# Patient Record
Sex: Female | Born: 1955 | Race: White | Hispanic: No | Marital: Married | Smoking: Former smoker
Health system: Southern US, Community
[De-identification: ages and names within clinical notes are randomized; demographics above are authoritative.]

## PROBLEM LIST (undated history)

## (undated) ENCOUNTER — Emergency Department (HOSPITAL_BASED_OUTPATIENT_CLINIC_OR_DEPARTMENT_OTHER): Payer: Medicare Other | Source: Home / Self Care

## (undated) DIAGNOSIS — F32A Depression, unspecified: Secondary | ICD-10-CM

## (undated) DIAGNOSIS — D4959 Neoplasm of unspecified behavior of other genitourinary organ: Secondary | ICD-10-CM

## (undated) DIAGNOSIS — R0989 Other specified symptoms and signs involving the circulatory and respiratory systems: Secondary | ICD-10-CM

## (undated) DIAGNOSIS — N393 Stress incontinence (female) (male): Secondary | ICD-10-CM

## (undated) DIAGNOSIS — F329 Major depressive disorder, single episode, unspecified: Secondary | ICD-10-CM

## (undated) DIAGNOSIS — N816 Rectocele: Secondary | ICD-10-CM

## (undated) DIAGNOSIS — F419 Anxiety disorder, unspecified: Secondary | ICD-10-CM

## (undated) DIAGNOSIS — F99 Mental disorder, not otherwise specified: Secondary | ICD-10-CM

## (undated) DIAGNOSIS — R002 Palpitations: Secondary | ICD-10-CM

## (undated) DIAGNOSIS — R000 Tachycardia, unspecified: Secondary | ICD-10-CM

## (undated) DIAGNOSIS — E119 Type 2 diabetes mellitus without complications: Secondary | ICD-10-CM

## (undated) DIAGNOSIS — D484 Neoplasm of uncertain behavior of peritoneum: Secondary | ICD-10-CM

## (undated) DIAGNOSIS — B009 Herpesviral infection, unspecified: Secondary | ICD-10-CM

## (undated) DIAGNOSIS — F319 Bipolar disorder, unspecified: Secondary | ICD-10-CM

## (undated) DIAGNOSIS — R079 Chest pain, unspecified: Secondary | ICD-10-CM

## (undated) HISTORY — DX: Stress incontinence (female) (male): N39.3

## (undated) HISTORY — DX: Type 2 diabetes mellitus without complications: E11.9

## (undated) HISTORY — PX: LAMINECTOMY: SHX219

## (undated) HISTORY — DX: Chest pain, unspecified: R07.9

## (undated) HISTORY — DX: Rectocele: N81.6

## (undated) HISTORY — DX: Neoplasm of unspecified behavior of other genitourinary organ: D49.59

## (undated) HISTORY — DX: Palpitations: R00.2

## (undated) HISTORY — DX: Other specified symptoms and signs involving the circulatory and respiratory systems: R09.89

## (undated) HISTORY — PX: BACK SURGERY: SHX140

## (undated) HISTORY — DX: Neoplasm of uncertain behavior of peritoneum: D48.4

## (undated) HISTORY — DX: Bipolar disorder, unspecified: F31.9

## (undated) HISTORY — DX: Tachycardia, unspecified: R00.0

## (undated) HISTORY — DX: Herpesviral infection, unspecified: B00.9

---

## 1979-07-13 DIAGNOSIS — B009 Herpesviral infection, unspecified: Secondary | ICD-10-CM

## 1979-07-13 HISTORY — DX: Herpesviral infection, unspecified: B00.9

## 1980-11-11 HISTORY — PX: ABDOMINAL HYSTERECTOMY: SHX81

## 1983-11-12 HISTORY — PX: KNEE SURGERY: SHX244

## 1983-11-12 HISTORY — PX: POSTERIOR REPAIR: SHX2254

## 1996-11-11 HISTORY — PX: CHOLECYSTECTOMY: SHX55

## 1996-11-11 HISTORY — PX: OOPHORECTOMY: SHX86

## 1996-11-11 HISTORY — PX: ANKLE SURGERY: SHX546

## 1998-02-11 ENCOUNTER — Emergency Department (HOSPITAL_COMMUNITY): Admission: EM | Admit: 1998-02-11 | Discharge: 1998-02-11 | Payer: Self-pay | Admitting: Emergency Medicine

## 1998-05-10 ENCOUNTER — Emergency Department (HOSPITAL_COMMUNITY): Admission: EM | Admit: 1998-05-10 | Discharge: 1998-05-10 | Payer: Self-pay | Admitting: Emergency Medicine

## 1998-08-23 ENCOUNTER — Other Ambulatory Visit: Admission: RE | Admit: 1998-08-23 | Discharge: 1998-08-23 | Payer: Self-pay | Admitting: Obstetrics and Gynecology

## 1998-11-22 ENCOUNTER — Emergency Department (HOSPITAL_COMMUNITY): Admission: EM | Admit: 1998-11-22 | Discharge: 1998-11-23 | Payer: Self-pay | Admitting: Emergency Medicine

## 1998-11-24 ENCOUNTER — Ambulatory Visit (HOSPITAL_COMMUNITY): Admission: RE | Admit: 1998-11-24 | Discharge: 1998-11-24 | Payer: Self-pay | Admitting: Gastroenterology

## 1998-12-09 ENCOUNTER — Emergency Department (HOSPITAL_COMMUNITY): Admission: EM | Admit: 1998-12-09 | Discharge: 1998-12-09 | Payer: Self-pay | Admitting: Emergency Medicine

## 1998-12-11 ENCOUNTER — Encounter (HOSPITAL_COMMUNITY): Admission: RE | Admit: 1998-12-11 | Discharge: 1999-03-11 | Payer: Self-pay | Admitting: Psychiatry

## 1999-01-22 ENCOUNTER — Encounter: Admission: RE | Admit: 1999-01-22 | Discharge: 1999-01-22 | Payer: Self-pay | Admitting: Sports Medicine

## 1999-01-23 ENCOUNTER — Encounter: Admission: RE | Admit: 1999-01-23 | Discharge: 1999-01-23 | Payer: Self-pay | Admitting: Sports Medicine

## 1999-02-07 ENCOUNTER — Encounter: Admission: RE | Admit: 1999-02-07 | Discharge: 1999-02-07 | Payer: Self-pay | Admitting: Family Medicine

## 1999-04-06 ENCOUNTER — Encounter: Admission: RE | Admit: 1999-04-06 | Discharge: 1999-04-06 | Payer: Self-pay | Admitting: Family Medicine

## 1999-04-19 ENCOUNTER — Encounter: Admission: RE | Admit: 1999-04-19 | Discharge: 1999-04-19 | Payer: Self-pay | Admitting: Family Medicine

## 1999-07-26 ENCOUNTER — Encounter: Admission: RE | Admit: 1999-07-26 | Discharge: 1999-07-26 | Payer: Self-pay | Admitting: Family Medicine

## 1999-10-02 ENCOUNTER — Encounter: Admission: RE | Admit: 1999-10-02 | Discharge: 1999-10-02 | Payer: Self-pay | Admitting: Sports Medicine

## 1999-10-16 ENCOUNTER — Encounter: Admission: RE | Admit: 1999-10-16 | Discharge: 1999-10-16 | Payer: Self-pay | Admitting: Family Medicine

## 1999-11-30 ENCOUNTER — Ambulatory Visit (HOSPITAL_BASED_OUTPATIENT_CLINIC_OR_DEPARTMENT_OTHER): Admission: RE | Admit: 1999-11-30 | Discharge: 1999-11-30 | Payer: Self-pay

## 1999-11-30 ENCOUNTER — Encounter (INDEPENDENT_AMBULATORY_CARE_PROVIDER_SITE_OTHER): Payer: Self-pay | Admitting: *Deleted

## 1999-12-12 ENCOUNTER — Other Ambulatory Visit: Admission: RE | Admit: 1999-12-12 | Discharge: 1999-12-12 | Payer: Self-pay | Admitting: Obstetrics and Gynecology

## 2000-01-12 ENCOUNTER — Ambulatory Visit (HOSPITAL_COMMUNITY): Admission: RE | Admit: 2000-01-12 | Discharge: 2000-01-12 | Payer: Self-pay | Admitting: *Deleted

## 2001-03-06 ENCOUNTER — Encounter: Admission: RE | Admit: 2001-03-06 | Discharge: 2001-03-06 | Payer: Self-pay | Admitting: Family Medicine

## 2001-03-26 ENCOUNTER — Other Ambulatory Visit: Admission: RE | Admit: 2001-03-26 | Discharge: 2001-03-26 | Payer: Self-pay | Admitting: Obstetrics and Gynecology

## 2001-04-03 ENCOUNTER — Encounter: Admission: RE | Admit: 2001-04-03 | Discharge: 2001-04-03 | Payer: Self-pay | Admitting: Obstetrics and Gynecology

## 2001-04-03 ENCOUNTER — Encounter: Payer: Self-pay | Admitting: Obstetrics and Gynecology

## 2001-04-09 ENCOUNTER — Other Ambulatory Visit: Admission: RE | Admit: 2001-04-09 | Discharge: 2001-04-09 | Payer: Self-pay

## 2001-12-02 ENCOUNTER — Encounter: Admission: RE | Admit: 2001-12-02 | Discharge: 2001-12-02 | Payer: Self-pay | Admitting: Family Medicine

## 2001-12-03 ENCOUNTER — Encounter: Payer: Self-pay | Admitting: Emergency Medicine

## 2001-12-03 ENCOUNTER — Emergency Department (HOSPITAL_COMMUNITY): Admission: EM | Admit: 2001-12-03 | Discharge: 2001-12-03 | Payer: Self-pay | Admitting: Emergency Medicine

## 2001-12-04 ENCOUNTER — Encounter: Admission: RE | Admit: 2001-12-04 | Discharge: 2001-12-04 | Payer: Self-pay | Admitting: Family Medicine

## 2001-12-08 ENCOUNTER — Ambulatory Visit (HOSPITAL_COMMUNITY): Admission: RE | Admit: 2001-12-08 | Discharge: 2001-12-08 | Payer: Self-pay | Admitting: *Deleted

## 2002-03-11 ENCOUNTER — Encounter: Admission: RE | Admit: 2002-03-11 | Discharge: 2002-03-11 | Payer: Self-pay | Admitting: Cardiology

## 2002-03-11 ENCOUNTER — Encounter: Payer: Self-pay | Admitting: Cardiology

## 2002-03-15 ENCOUNTER — Ambulatory Visit (HOSPITAL_COMMUNITY): Admission: RE | Admit: 2002-03-15 | Discharge: 2002-03-15 | Payer: Self-pay | Admitting: Cardiology

## 2002-03-15 HISTORY — PX: CARDIAC CATHETERIZATION: SHX172

## 2002-03-30 ENCOUNTER — Other Ambulatory Visit: Admission: RE | Admit: 2002-03-30 | Discharge: 2002-03-30 | Payer: Self-pay | Admitting: Obstetrics and Gynecology

## 2002-06-22 ENCOUNTER — Inpatient Hospital Stay (HOSPITAL_COMMUNITY): Admission: EM | Admit: 2002-06-22 | Discharge: 2002-06-25 | Payer: Self-pay | Admitting: Psychiatry

## 2002-07-09 ENCOUNTER — Encounter: Admission: RE | Admit: 2002-07-09 | Discharge: 2002-07-09 | Payer: Self-pay | Admitting: Family Medicine

## 2002-10-12 ENCOUNTER — Emergency Department (HOSPITAL_COMMUNITY): Admission: EM | Admit: 2002-10-12 | Discharge: 2002-10-12 | Payer: Self-pay | Admitting: Emergency Medicine

## 2002-10-24 ENCOUNTER — Encounter: Payer: Self-pay | Admitting: Neurosurgery

## 2002-10-24 ENCOUNTER — Ambulatory Visit (HOSPITAL_COMMUNITY): Admission: RE | Admit: 2002-10-24 | Discharge: 2002-10-24 | Payer: Self-pay | Admitting: Neurosurgery

## 2002-12-08 ENCOUNTER — Encounter: Admission: RE | Admit: 2002-12-08 | Discharge: 2002-12-20 | Payer: Self-pay | Admitting: Orthopedic Surgery

## 2002-12-15 ENCOUNTER — Encounter: Admission: RE | Admit: 2002-12-15 | Discharge: 2002-12-15 | Payer: Self-pay | Admitting: Family Medicine

## 2002-12-22 ENCOUNTER — Encounter: Admission: RE | Admit: 2002-12-22 | Discharge: 2002-12-22 | Payer: Self-pay | Admitting: Family Medicine

## 2003-02-14 ENCOUNTER — Encounter: Admission: RE | Admit: 2003-02-14 | Discharge: 2003-02-14 | Payer: Self-pay | Admitting: Family Medicine

## 2003-04-15 ENCOUNTER — Encounter: Admission: RE | Admit: 2003-04-15 | Discharge: 2003-04-15 | Payer: Self-pay | Admitting: Family Medicine

## 2003-04-19 ENCOUNTER — Other Ambulatory Visit: Admission: RE | Admit: 2003-04-19 | Discharge: 2003-04-19 | Payer: Self-pay | Admitting: Obstetrics and Gynecology

## 2003-09-12 ENCOUNTER — Encounter: Admission: RE | Admit: 2003-09-12 | Discharge: 2003-09-12 | Payer: Self-pay | Admitting: Family Medicine

## 2003-11-08 ENCOUNTER — Inpatient Hospital Stay (HOSPITAL_COMMUNITY): Admission: EM | Admit: 2003-11-08 | Discharge: 2003-11-09 | Payer: Self-pay | Admitting: Emergency Medicine

## 2003-11-23 ENCOUNTER — Inpatient Hospital Stay (HOSPITAL_COMMUNITY): Admission: EM | Admit: 2003-11-23 | Discharge: 2003-11-25 | Payer: Self-pay | Admitting: Psychiatry

## 2003-12-27 ENCOUNTER — Encounter: Admission: RE | Admit: 2003-12-27 | Discharge: 2003-12-27 | Payer: Self-pay | Admitting: Family Medicine

## 2004-04-23 ENCOUNTER — Other Ambulatory Visit: Admission: RE | Admit: 2004-04-23 | Discharge: 2004-04-23 | Payer: Self-pay | Admitting: Obstetrics and Gynecology

## 2004-09-03 ENCOUNTER — Emergency Department (HOSPITAL_COMMUNITY): Admission: EM | Admit: 2004-09-03 | Discharge: 2004-09-03 | Payer: Self-pay | Admitting: Emergency Medicine

## 2004-09-14 ENCOUNTER — Ambulatory Visit: Payer: Self-pay | Admitting: Sports Medicine

## 2004-11-21 ENCOUNTER — Ambulatory Visit: Payer: Self-pay | Admitting: Family Medicine

## 2005-02-01 ENCOUNTER — Inpatient Hospital Stay (HOSPITAL_COMMUNITY): Admission: AD | Admit: 2005-02-01 | Discharge: 2005-02-01 | Payer: Self-pay | Admitting: Obstetrics and Gynecology

## 2005-02-26 ENCOUNTER — Ambulatory Visit (HOSPITAL_COMMUNITY): Admission: RE | Admit: 2005-02-26 | Discharge: 2005-02-26 | Payer: Self-pay | Admitting: Gastroenterology

## 2005-02-26 ENCOUNTER — Encounter (INDEPENDENT_AMBULATORY_CARE_PROVIDER_SITE_OTHER): Payer: Self-pay | Admitting: *Deleted

## 2005-03-12 ENCOUNTER — Ambulatory Visit: Payer: Self-pay | Admitting: Family Medicine

## 2005-04-11 ENCOUNTER — Inpatient Hospital Stay (HOSPITAL_COMMUNITY): Admission: RE | Admit: 2005-04-11 | Discharge: 2005-04-11 | Payer: Self-pay | Admitting: Psychiatry

## 2005-04-11 ENCOUNTER — Ambulatory Visit: Payer: Self-pay | Admitting: Psychiatry

## 2005-06-25 ENCOUNTER — Other Ambulatory Visit: Admission: RE | Admit: 2005-06-25 | Discharge: 2005-06-25 | Payer: Self-pay | Admitting: Obstetrics and Gynecology

## 2005-07-22 ENCOUNTER — Ambulatory Visit: Payer: Self-pay | Admitting: Family Medicine

## 2005-09-19 ENCOUNTER — Ambulatory Visit (HOSPITAL_BASED_OUTPATIENT_CLINIC_OR_DEPARTMENT_OTHER): Admission: RE | Admit: 2005-09-19 | Discharge: 2005-09-19 | Payer: Self-pay | Admitting: Orthopedic Surgery

## 2005-09-19 ENCOUNTER — Ambulatory Visit (HOSPITAL_COMMUNITY): Admission: RE | Admit: 2005-09-19 | Discharge: 2005-09-19 | Payer: Self-pay | Admitting: Orthopedic Surgery

## 2005-10-15 ENCOUNTER — Ambulatory Visit: Payer: Self-pay | Admitting: Sports Medicine

## 2005-11-28 ENCOUNTER — Emergency Department (HOSPITAL_COMMUNITY): Admission: EM | Admit: 2005-11-28 | Discharge: 2005-11-29 | Payer: Self-pay | Admitting: Emergency Medicine

## 2005-12-15 ENCOUNTER — Encounter: Admission: RE | Admit: 2005-12-15 | Discharge: 2005-12-15 | Payer: Self-pay | Admitting: Orthopedic Surgery

## 2006-01-10 ENCOUNTER — Encounter: Admission: RE | Admit: 2006-01-10 | Discharge: 2006-01-10 | Payer: Self-pay | Admitting: Orthopedic Surgery

## 2006-01-28 ENCOUNTER — Encounter: Admission: RE | Admit: 2006-01-28 | Discharge: 2006-01-28 | Payer: Self-pay | Admitting: Orthopedic Surgery

## 2006-02-21 ENCOUNTER — Ambulatory Visit: Payer: Self-pay | Admitting: Family Medicine

## 2006-04-01 ENCOUNTER — Ambulatory Visit: Payer: Self-pay | Admitting: Urology

## 2006-05-14 ENCOUNTER — Emergency Department: Payer: Self-pay | Admitting: General Practice

## 2006-05-14 ENCOUNTER — Other Ambulatory Visit: Payer: Self-pay

## 2006-05-15 ENCOUNTER — Other Ambulatory Visit: Payer: Self-pay

## 2006-06-23 ENCOUNTER — Emergency Department (HOSPITAL_COMMUNITY): Admission: EM | Admit: 2006-06-23 | Discharge: 2006-06-23 | Payer: Self-pay | Admitting: Emergency Medicine

## 2006-08-08 ENCOUNTER — Ambulatory Visit: Payer: Self-pay | Admitting: Family Medicine

## 2006-09-03 ENCOUNTER — Other Ambulatory Visit: Admission: RE | Admit: 2006-09-03 | Discharge: 2006-09-03 | Payer: Self-pay | Admitting: Obstetrics and Gynecology

## 2006-11-12 ENCOUNTER — Ambulatory Visit: Payer: Self-pay | Admitting: Family Medicine

## 2006-12-04 ENCOUNTER — Emergency Department: Payer: Self-pay | Admitting: Emergency Medicine

## 2007-01-08 DIAGNOSIS — F319 Bipolar disorder, unspecified: Secondary | ICD-10-CM | POA: Insufficient documentation

## 2007-01-08 DIAGNOSIS — F17200 Nicotine dependence, unspecified, uncomplicated: Secondary | ICD-10-CM | POA: Insufficient documentation

## 2007-01-08 DIAGNOSIS — I951 Orthostatic hypotension: Secondary | ICD-10-CM | POA: Insufficient documentation

## 2007-01-08 DIAGNOSIS — R002 Palpitations: Secondary | ICD-10-CM | POA: Insufficient documentation

## 2007-01-13 ENCOUNTER — Telehealth: Payer: Self-pay | Admitting: *Deleted

## 2007-01-14 ENCOUNTER — Telehealth: Payer: Self-pay | Admitting: *Deleted

## 2007-01-15 ENCOUNTER — Ambulatory Visit: Payer: Self-pay | Admitting: Sports Medicine

## 2007-01-15 DIAGNOSIS — E669 Obesity, unspecified: Secondary | ICD-10-CM | POA: Insufficient documentation

## 2007-01-15 DIAGNOSIS — R1110 Vomiting, unspecified: Secondary | ICD-10-CM | POA: Insufficient documentation

## 2007-01-15 DIAGNOSIS — F502 Bulimia nervosa: Secondary | ICD-10-CM | POA: Insufficient documentation

## 2007-01-22 ENCOUNTER — Ambulatory Visit: Payer: Self-pay | Admitting: Sports Medicine

## 2007-02-09 ENCOUNTER — Emergency Department (HOSPITAL_COMMUNITY): Admission: EM | Admit: 2007-02-09 | Discharge: 2007-02-09 | Payer: Self-pay | Admitting: Emergency Medicine

## 2007-03-04 ENCOUNTER — Encounter: Admission: RE | Admit: 2007-03-04 | Discharge: 2007-03-04 | Payer: Self-pay | Admitting: Gastroenterology

## 2007-04-20 ENCOUNTER — Telehealth: Payer: Self-pay | Admitting: *Deleted

## 2007-04-20 ENCOUNTER — Ambulatory Visit: Payer: Self-pay | Admitting: Sports Medicine

## 2007-04-20 DIAGNOSIS — M5430 Sciatica, unspecified side: Secondary | ICD-10-CM | POA: Insufficient documentation

## 2007-04-30 ENCOUNTER — Encounter: Admission: RE | Admit: 2007-04-30 | Discharge: 2007-04-30 | Payer: Self-pay | Admitting: Orthopedic Surgery

## 2007-06-24 ENCOUNTER — Ambulatory Visit: Payer: Self-pay | Admitting: Family Medicine

## 2007-06-24 ENCOUNTER — Encounter (INDEPENDENT_AMBULATORY_CARE_PROVIDER_SITE_OTHER): Payer: Self-pay | Admitting: Family Medicine

## 2007-06-24 DIAGNOSIS — J029 Acute pharyngitis, unspecified: Secondary | ICD-10-CM | POA: Insufficient documentation

## 2007-06-24 LAB — CONVERTED CEMR LAB: Rapid Strep: NEGATIVE

## 2007-06-27 ENCOUNTER — Telehealth (INDEPENDENT_AMBULATORY_CARE_PROVIDER_SITE_OTHER): Payer: Self-pay | Admitting: Family Medicine

## 2007-06-29 ENCOUNTER — Telehealth: Payer: Self-pay | Admitting: *Deleted

## 2007-09-23 ENCOUNTER — Other Ambulatory Visit: Admission: RE | Admit: 2007-09-23 | Discharge: 2007-09-23 | Payer: Self-pay | Admitting: Obstetrics and Gynecology

## 2007-10-24 ENCOUNTER — Emergency Department (HOSPITAL_COMMUNITY): Admission: EM | Admit: 2007-10-24 | Discharge: 2007-10-24 | Payer: Self-pay | Admitting: *Deleted

## 2008-01-15 ENCOUNTER — Emergency Department (HOSPITAL_COMMUNITY): Admission: EM | Admit: 2008-01-15 | Discharge: 2008-01-16 | Payer: Self-pay | Admitting: Family Medicine

## 2008-01-18 ENCOUNTER — Telehealth (INDEPENDENT_AMBULATORY_CARE_PROVIDER_SITE_OTHER): Payer: Self-pay | Admitting: Family Medicine

## 2008-01-18 ENCOUNTER — Ambulatory Visit: Payer: Self-pay | Admitting: Family Medicine

## 2008-01-29 ENCOUNTER — Ambulatory Visit: Payer: Self-pay | Admitting: Family Medicine

## 2008-01-29 ENCOUNTER — Telehealth: Payer: Self-pay | Admitting: Family Medicine

## 2008-01-29 DIAGNOSIS — M79609 Pain in unspecified limb: Secondary | ICD-10-CM | POA: Insufficient documentation

## 2008-02-03 ENCOUNTER — Encounter (INDEPENDENT_AMBULATORY_CARE_PROVIDER_SITE_OTHER): Payer: Self-pay | Admitting: Family Medicine

## 2008-03-04 ENCOUNTER — Ambulatory Visit: Payer: Self-pay | Admitting: Family Medicine

## 2008-03-09 ENCOUNTER — Encounter (INDEPENDENT_AMBULATORY_CARE_PROVIDER_SITE_OTHER): Payer: Self-pay | Admitting: Family Medicine

## 2008-03-31 ENCOUNTER — Ambulatory Visit (HOSPITAL_COMMUNITY): Admission: RE | Admit: 2008-03-31 | Discharge: 2008-03-31 | Payer: Self-pay | Admitting: *Deleted

## 2008-04-15 ENCOUNTER — Ambulatory Visit: Payer: Self-pay | Admitting: Family Medicine

## 2008-04-15 ENCOUNTER — Telehealth (INDEPENDENT_AMBULATORY_CARE_PROVIDER_SITE_OTHER): Payer: Self-pay | Admitting: *Deleted

## 2008-04-15 DIAGNOSIS — M549 Dorsalgia, unspecified: Secondary | ICD-10-CM | POA: Insufficient documentation

## 2008-04-21 ENCOUNTER — Ambulatory Visit (HOSPITAL_COMMUNITY): Admission: RE | Admit: 2008-04-21 | Discharge: 2008-04-21 | Payer: Self-pay | Admitting: Orthopaedic Surgery

## 2008-04-23 ENCOUNTER — Encounter (INDEPENDENT_AMBULATORY_CARE_PROVIDER_SITE_OTHER): Payer: Self-pay | Admitting: Family Medicine

## 2008-04-23 ENCOUNTER — Emergency Department (HOSPITAL_BASED_OUTPATIENT_CLINIC_OR_DEPARTMENT_OTHER): Admission: EM | Admit: 2008-04-23 | Discharge: 2008-04-24 | Payer: Self-pay | Admitting: Emergency Medicine

## 2008-04-23 ENCOUNTER — Telehealth (INDEPENDENT_AMBULATORY_CARE_PROVIDER_SITE_OTHER): Payer: Self-pay | Admitting: Family Medicine

## 2008-04-26 ENCOUNTER — Emergency Department (HOSPITAL_COMMUNITY): Admission: EM | Admit: 2008-04-26 | Discharge: 2008-04-26 | Payer: Self-pay | Admitting: Emergency Medicine

## 2008-04-26 ENCOUNTER — Telehealth (INDEPENDENT_AMBULATORY_CARE_PROVIDER_SITE_OTHER): Payer: Self-pay | Admitting: Family Medicine

## 2008-04-28 ENCOUNTER — Telehealth (INDEPENDENT_AMBULATORY_CARE_PROVIDER_SITE_OTHER): Payer: Self-pay | Admitting: *Deleted

## 2008-05-18 ENCOUNTER — Inpatient Hospital Stay (HOSPITAL_COMMUNITY): Admission: AD | Admit: 2008-05-18 | Discharge: 2008-05-18 | Payer: Self-pay | Admitting: Gynecology

## 2008-05-20 ENCOUNTER — Encounter: Admission: RE | Admit: 2008-05-20 | Discharge: 2008-05-20 | Payer: Self-pay | Admitting: Obstetrics and Gynecology

## 2008-05-25 ENCOUNTER — Ambulatory Visit (HOSPITAL_COMMUNITY): Admission: RE | Admit: 2008-05-25 | Discharge: 2008-05-26 | Payer: Self-pay | Admitting: Addiction Medicine

## 2008-06-08 ENCOUNTER — Ambulatory Visit: Payer: Self-pay | Admitting: Family Medicine

## 2008-06-08 ENCOUNTER — Telehealth: Payer: Self-pay | Admitting: *Deleted

## 2008-06-09 ENCOUNTER — Telehealth (INDEPENDENT_AMBULATORY_CARE_PROVIDER_SITE_OTHER): Payer: Self-pay | Admitting: Family Medicine

## 2008-06-25 ENCOUNTER — Telehealth (INDEPENDENT_AMBULATORY_CARE_PROVIDER_SITE_OTHER): Payer: Self-pay | Admitting: Family Medicine

## 2008-07-01 ENCOUNTER — Emergency Department (HOSPITAL_COMMUNITY): Admission: EM | Admit: 2008-07-01 | Discharge: 2008-07-01 | Payer: Self-pay | Admitting: Emergency Medicine

## 2008-07-21 ENCOUNTER — Encounter: Admission: RE | Admit: 2008-07-21 | Discharge: 2008-07-21 | Payer: Self-pay | Admitting: Obstetrics and Gynecology

## 2008-11-17 ENCOUNTER — Ambulatory Visit: Payer: Self-pay | Admitting: Obstetrics and Gynecology

## 2009-08-02 ENCOUNTER — Emergency Department (HOSPITAL_COMMUNITY): Admission: EM | Admit: 2009-08-02 | Discharge: 2009-08-02 | Payer: Self-pay | Admitting: Emergency Medicine

## 2010-02-09 ENCOUNTER — Encounter: Admission: RE | Admit: 2010-02-09 | Discharge: 2010-02-09 | Payer: Self-pay | Admitting: Obstetrics and Gynecology

## 2010-03-13 ENCOUNTER — Emergency Department (HOSPITAL_BASED_OUTPATIENT_CLINIC_OR_DEPARTMENT_OTHER): Admission: EM | Admit: 2010-03-13 | Discharge: 2010-03-13 | Payer: Self-pay | Admitting: Emergency Medicine

## 2010-05-15 ENCOUNTER — Ambulatory Visit (HOSPITAL_COMMUNITY): Admission: RE | Admit: 2010-05-15 | Discharge: 2010-05-15 | Payer: Self-pay | Admitting: Psychiatry

## 2010-05-21 ENCOUNTER — Other Ambulatory Visit (HOSPITAL_COMMUNITY): Admission: RE | Admit: 2010-05-21 | Discharge: 2010-05-22 | Payer: Self-pay | Admitting: Psychiatry

## 2010-06-05 ENCOUNTER — Ambulatory Visit: Payer: Self-pay | Admitting: Obstetrics and Gynecology

## 2010-06-05 ENCOUNTER — Other Ambulatory Visit: Admission: RE | Admit: 2010-06-05 | Discharge: 2010-06-05 | Payer: Self-pay | Admitting: Obstetrics and Gynecology

## 2010-06-13 ENCOUNTER — Encounter: Payer: Self-pay | Admitting: Family Medicine

## 2010-07-25 ENCOUNTER — Ambulatory Visit: Payer: Self-pay | Admitting: Obstetrics and Gynecology

## 2010-09-24 ENCOUNTER — Ambulatory Visit: Payer: Self-pay | Admitting: Obstetrics and Gynecology

## 2010-11-09 ENCOUNTER — Emergency Department (HOSPITAL_BASED_OUTPATIENT_CLINIC_OR_DEPARTMENT_OTHER)
Admission: EM | Admit: 2010-11-09 | Discharge: 2010-11-09 | Payer: Self-pay | Source: Home / Self Care | Admitting: Emergency Medicine

## 2010-11-14 ENCOUNTER — Emergency Department (HOSPITAL_BASED_OUTPATIENT_CLINIC_OR_DEPARTMENT_OTHER)
Admission: EM | Admit: 2010-11-14 | Discharge: 2010-11-14 | Payer: Self-pay | Source: Home / Self Care | Admitting: Emergency Medicine

## 2010-12-02 ENCOUNTER — Encounter: Payer: Self-pay | Admitting: Obstetrics and Gynecology

## 2010-12-02 ENCOUNTER — Encounter: Payer: Self-pay | Admitting: Orthopedic Surgery

## 2010-12-02 ENCOUNTER — Encounter: Payer: Self-pay | Admitting: *Deleted

## 2010-12-11 NOTE — Consult Note (Signed)
Summary: [facility]   Imported By: De Nurse 06/20/2010 14:02:32  _____________________________________________________________________  External Attachment:    Type:   Image     Comment:   External Document

## 2011-02-15 LAB — CBC
HCT: 39.7 % (ref 36.0–46.0)
Hemoglobin: 13.7 g/dL (ref 12.0–15.0)
Platelets: 335 10*3/uL (ref 150–400)
RBC: 3.99 MIL/uL (ref 3.87–5.11)
WBC: 10.9 10*3/uL — ABNORMAL HIGH (ref 4.0–10.5)

## 2011-02-15 LAB — DIFFERENTIAL
Basophils Absolute: 0.1 10*3/uL (ref 0.0–0.1)
Basophils Relative: 1 % (ref 0–1)
Monocytes Absolute: 0.5 10*3/uL (ref 0.1–1.0)
Neutro Abs: 5.9 10*3/uL (ref 1.7–7.7)

## 2011-02-15 LAB — COMPREHENSIVE METABOLIC PANEL
Albumin: 3.7 g/dL (ref 3.5–5.2)
Alkaline Phosphatase: 79 U/L (ref 39–117)
BUN: 8 mg/dL (ref 6–23)
CO2: 27 mEq/L (ref 19–32)
Chloride: 106 mEq/L (ref 96–112)
GFR calc non Af Amer: >60 mL/min (ref >60)
Glucose, Bld: 129 mg/dL — ABNORMAL HIGH (ref 70–99)
Potassium: 3.2 mEq/L — ABNORMAL LOW (ref 3.5–5.1)
Total Bilirubin: 0.3 mg/dL (ref 0.3–1.2)

## 2011-02-15 LAB — ETHANOL: Alcohol, Ethyl (B): <5 mg/dL (ref 0–10)

## 2011-03-26 NOTE — Discharge Summary (Signed)
NAMEGIUSEPPINA, [name]             ACCOUNT NO.:  [id number]   MEDICAL RECORD NO.:  [id number]          PATIENT TYPE:  OIB   LOCATION:  9310                          FACILITY:  [facility]   PHYSICIAN:  Daniel L. Gottsegen, M.D.DATE OF BIRTH:  03/23/1956   DATE OF ADMISSION:  05/25/2008  DATE OF DISCHARGE:  05/26/2008                               DISCHARGE SUMMARY   The patient is a 55 year old female who was taken to the operating room  on May 25, 2008, for a chronic right lower quadrant pain.  An open  laparoscopy was performed.  Findings were adhesions of bowel to the  anterior peritoneal wall which were taken down with a harmonic scalpel  and a sharp scissor without difficulty.  Because of the lateness  the  procedure was done and the extensiveness of the lysis of adhesions and  concern about bowel injury, it was felt wiser to keep her for  observation overnight.  On the morning of 16th, she was voiding well,  passing gas, had a soft abdomen and was tolerating p.o. foods, so she  was discharge.   DISCHARGE MEDICATION:  Either Advil or Aleve for pain relief and she was  also given a prescription for Percocet 5/325.   FOLLOWUP:  She will return to the office in 1 week.   WOUND CARE:  Routine.   ACTIVITY:  Regular.  Only restrictions is no lifting.   LABORATORY DATA:  Preoperative hemoglobin is 15.3, hematocrit 45.8,  white count was 13,900.  Urinalysis was negative.   CONDITION ON DISCHARGE:  Improved.   FINAL DIAGNOSIS:  Chronic right lower quadrant pain with adhesions of  bowel to the anterior peritoneal wall.   OPERATIONS:  Laparoscopy with lysis of bowel adhesions, freeing up the  bowel from the anterior abdominal wall.      Daniel L. Eda Paschal, M.D.  Electronically Signed     DLG/MEDQ  D:  05/26/2008  T:  05/27/2008  Job:  [id number]

## 2011-03-26 NOTE — H&P (Signed)
[name], [name]             ACCOUNT NO.:  [id number]   MEDICAL RECORD NO.:  [id number]          PATIENT TYPE:  AMB   LOCATION:  SDC                           [facility]   PHYSICIAN:  Daniel L. Gottsegen, M.D.DATE OF BIRTH:  November 11, 1956   DATE OF ADMISSION:  DATE OF DISCHARGE:                              HISTORY & PHYSICAL   CHIEF COMPLAINT:  Right lower quadrant pain.   HISTORY OF PRESENT ILLNESS:  The patient is a 55 year old gravida 56,  para 4-1-4-4 who had presented to the office approximately 1 month ago  with right lower quadrant pain.  The patient did not have symptoms  referable to either her GI tract or GU tract.  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  On ultrasound, she had a small  ovarian cyst.  She initially was watched expectantly, but the pain has  persisted and actually gotten worse.  The patient has been to the  emergency room on several occasions.  Because of the above, she has now  undergone a CT scan of her abdomen and pelvis which did not reveal any  etiology for the above.  She has a normal appendix on CT scan.  Because  of the persistence, she now enters the hospital for laparoscopy with  hopes that it will help [place] assess why she is having this constant pain.  She appreciates the fact that because of her previous surgery, she might  have dense pelvic adhesive disease making impossible to see her ovary  and tube laparoscopically.  We had extreme difficulty when we did her  salpingo-oophorectomy on the left, but at that time, which was her last  surgery, she did have no adhesions in the right lower quadrant.  We have  elected to remove her right ovary and tube because her pain appeared to  be localized there with a small cyst, which has been there on repetitive  ultrasounds.  However, the patient appreciating recuperation from a  laparotomy, has elected not to  have a laparotomy if we cannot find the  source of the pain or treat it laparoscopically at this point, but to  continue to watch the pain and so not to have an extended recuperation.  I think that is reasonable based on the fact that there is no evidence  that this is a malignancy.   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   PRESENT MEDICATIONS:  Digoxin per her cardiologist and Xanax p.r.n.  anxiety.   ALLERGIES:  She is allergic only to CODEINE, but she does fine with  Percocet.   FAMILY HISTORY:  Reveals the paternal grandmother and father were  diabetic.  Paternal grandfather had coronary artery disease.   SOCIAL HISTORY:  She smokes approximately half a pack of cigarettes a  day.  She rarely uses alcohol  REVIEW OF SYSTEMS:  HEENT:  Negative.  CARDIOVASCULAR:  See above.  RESPIRATORY:  Negative.  GI:  Irritable bowel syndrome.  Please note  this pain is different than what she gets with irritable bowel.  MUSCULOSKELETAL:  Negative.  GU:  Negative.  NEUROLOGIC/PSYCHIATRIC:  Reveals a history of depression and previous psychiatric care.  She is  currently on no psychiatric drugs. ALLERGIC, IMMUNOLOGIC, LYMPHATIC, AND  ENDOCRINE:  Negative.   PHYSICAL EXAMINATION:  GENERAL:  The patient is a well-developed and  well-nourished female in no acute distress.  VITAL SIGNS:  Her blood pressure is 124/74.  Her pulses 80 and regular.  Her respirations are 16 and nonlabored.  HEENT:  Within normal limits.  NECK:  Supple.  Trachea in the midline.  The thyroid is not enlarged.  LUNGS:  Clear to P&A.  HEART:  No thrills, heaves, or murmurs.  BREASTS:  No masses.  ABDOMEN:  Soft without guarding, rebound, or masses.  PELVIC:  External and  BUS is normal.  Vaginal is normal.  Pap shows no  atypia.  Uterus is surgically absent.  Her right adnexa is not enlarged  on bimanual, but is extremely tender.  Left adnexa is surgically absent.  Rectovaginal is confirmatory.   ADMISSION/IMPRESSION:  Right lower quadrant pain of unknown etiology.   PLAN:  Diagnostic laparoscopy for diagnosis of the above with right  salpingo-oophorectomy; if it can be done laparoscopically, otherwise, it  will not be done.      Daniel L. Eda Paschal, M.D.  Electronically Signed     DLG/MEDQ  D:  05/25/2008  T:  05/25/2008  Job:  [id number]

## 2011-03-26 NOTE — Op Note (Signed)
[name], [name]             ACCOUNT NO.:  [id number]   MEDICAL RECORD NO.:  [id number]          PATIENT TYPE:  OIB   LOCATION:  9310                          [facility]   PHYSICIAN:  Daniel L. Gottsegen, M.D.DATE OF BIRTH:  05-Oct-1956   DATE OF PROCEDURE:  05/25/2008  DATE OF DISCHARGE:                               OPERATIVE REPORT   PREOPERATIVE DIAGNOSIS:  Right lower quadrant pain.   POSTOPERATIVE DIAGNOSES:  1. Right lower quadrant pain.  2. Dense adhesions of small bowel and sigmoid to anterior peritoneal      wall.   OPERATIONS:  Diagnostic laparoscopy with lysis of adhesions.   SURGEON:  Daniel L. Eda Paschal, MD   FIRST ASSISTANTGaetano Hawthorne. Lily Peer, MD   FINDINGS:  At the time of laparoscopy, the most obvious abnormality was  that the patient had adhered some small bowel and the sigmoid colon to  the anterior peritoneal wall.  There were sort of windows on both sides.  We could see into the pelvis, but it was adherent in the top and when  you pushed on her abdomen, it appeared to be adherent right at the right  lower quadrant where the patient was having her pain.  Once all these  adhesions had been freed up, although she had other small and large  bowel adhesions, none of them involved putting any bowel on tension and  appeared to be consistent with what we really saw after a laparotomy.  The left pelvic area was free of any disease.  You could not see where  the right ovary and tube were, and it was thought that they probably  were buried under small bowel that was adherent into the lower part of  the cul-de-sac.  The ileocecal junction was identified.  The appendix  appeared normal.  The right upper quadrant was visualized.  Her liver  appeared normal.  Her gallbladder had been previously removed.  The  laparoscope was also placed up in the left upper quadrant and nothing  was seen there either.   PROCEDURE:  After adequate general endotracheal  anesthesia, the patient  was placed in the dorsal lithotomy position, prepped and draped in the  usual sterile manner.  A sponge stick was placed in the vagina to see  where the vaginal cuff was, so a Foley catheter was inserted into the  patient's bladder.  It was decided that an open laparoscopy was safer  because of her previous midline vertical incision than a closed  laparoscopy.  A small vertical incision was made below the umbilicus.  It was carried through the fascia, which was tacked with 0 Vicryl.  The  peritoneum was entered with sharp dissection without difficulty.  A  Hasson catheter was put in place.  A pneumoperitoneum was created.  Throughout the procedure either the 10-mm laparoscope that was a  diagnostic or the operating 10-mm laparoscope was used.  At one point, a  5-mm laparoscope was placed in the right lower quadrant incision to look  for bowel behind the adhesions.  Two other ports were placed, one in the  right  lower quadrant and one in the left lower quadrant, these were 5 mm  ports. Through this, a variety of instruments were placed as the  procedure was done it was done carefully and slowly to prevent injury to  the bowel.  Instrumentation used to free up the bowel included just a  sharp scissor without any energy source or the Harmonic scalpel.  There  were times when it was easier to lyse the adhesions through the  operating channel of the scope and there were times when you can use the  Harmonic scalpel through one of the lower incisions.  The Harmonic  scalpel was actually used both in the right and the left lower  incisions.  All the bowel was freed up.  At no point was any bleeding  encountered.  The pelvis was carefully inspected when we were done and  there were absolutely no bleeding and no injury to the bowel.  We could  see no other reason for her pain, although we were pretty comfortable  that we had found the source of it, which is where the bowel  had been  adherent to the anterior wall and was put on tension.  We could not see  the right adnexa. Our discussion with the patient preoperatively had  been that if we could see it we would remove it.  On ultrasound and on  CT scan, it did not show any pathology and therefore it was certainly  not mandatory to do so.  She did not want to have a laparotomy just to  remove it and I do not think it would have helped her pain either, so at  this point, the procedure was terminated.  The trocars were removed.  The pneumoperitoneum was evacuated.  The 0 Vicryl suture had been placed  in the fascia subumbilically was tied in place, which completely closed  the fascia.  The subcutaneous tissue was brought together with 0 Vicryl  and then all incisions were closed with Steri-Strips.  Blood loss during  the procedure was minimal.  The patient continued to make clear urine  throughout the procedure.  The Foley catheter was removed at the  termination of procedure.      Daniel L. Eda Paschal, M.D.  Electronically Signed     DLG/MEDQ  D:  05/25/2008  T:  05/26/2008  Job:  [id number]

## 2011-03-29 NOTE — H&P (Signed)
NAME:  [name], [name]                       ACCOUNT NO.:  [id number]   MEDICAL RECORD NO.:  [id number]                   PATIENT TYPE:  IPS   LOCATION:  0506                                 [facility]ILITY[facility]   PHYSICIAN:  Geoffery Lyons, M.D.                   DATE OF BIRTH:  [birth year]   DATE OF ADMISSION:  11/23/2003  DATE OF DISCHARGE:                                HISTORY & PHYSICAL   IDENTIFYING INFORMATION:  A 55 year old married white female, voluntarily  admitted on November 23, 2003.   HISTORY OF PRESENT ILLNESS:  The patient presents with a history of  depression and suicidal thoughts for one day with no specific plan.  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  She  denies any psychotic symptoms.  She states she is no longer suicidal.  She  has been compliant with the medications.  Her sleeping and appetite has been  satisfactory.  The patient is anxious to go home.  The patient states she  has a weekend with her husband for the first time.  Her sister-in-law is  going to be caring for her mother-in-law.   PAST PSYCHIATRIC HISTORY:  Second hospitalization at [facility], was here in August 2004 for depression.  She has a therapist, Gretchen Short and Dr. Rosalia Hammers who is her psychiatrist.  History of bipolar disorder,  history of a suicide attempt years ago where she overdosed.   SOCIAL HISTORY:  She is a 55 year old married white female, married for 17  years, has 4 children ages 50, 5, 86 and 52.  She lives with her husband  and is carrying for her mother-in-law for the past 14 months, who has  Alzheimer's.  No legal problems, completed 1 year of college.  She is on  disability due to her back and her bipolar disorder.   FAMILY HISTORY:  She believes her mother has some psychiatric problem but  she is unsure what it is.   ALCOHOL DRUG HISTORY:  The patient  smokes, she drinks 1-2 drinks per year,  denies any drug use.   PAST MEDICAL HISTORY:  Primary care Carmita Boom is Dr. Georgina Peer.  Medical problems are chronic back pain, chronic urinary tract infection,  history of arrythmia.   MEDICATIONS:  Digoxin 0.25 mg, Elavil 100 mg q.h.s., Valium 10 mg in  morning, 20 at bedtime, has been on that for years.  Paxil CR 25 mg b.i.d.,  Depakote 500 in the morning and 1000 mg at bedtime, Haldol 5 mg t.i.d., was  put on that for paranoid ideation.  Hydrocodone 5/500 prescribed by Dr.  Sabino Donovan.   DRUG ALLERGIES:  No known allergies.   PHYSICAL EXAMINATION:  Performed, no significant findings, nonfocal  neurological findings.  Her vital signs are within normal limits.  The  patient is overweight with a weight of 212 pounds.   LABORATORY DATA:  Platelet count is 423, glucose is 116.   MENTAL STATUS EXAM:  She is an alert, middle-aged female, casually dressed,  cooperative.  Speech is clear, mood is some mild depression, mild anxiety.  Thought processes are coherent.  There is no evidence of psychosis, no  auditory or visual hallucinations, cognitive function intact.  Judgment and  insight is fair.   ADMISSION DIAGNOSES:   AXIS I:  Bipolar disorder.   AXIS II:  Deferred.   AXIS III:  Chronic back pain, chronic urinary tract infection and history of  arrythmia.   AXIS IV:  Other psychosocial problems related to caregiver stress, medical  problems.   AXIS V:  Current is 40, estimated this past year 65-70.   PLAN:  Voluntary admission for increased depression, unable to contract for  safety.  Stabilize mood and thinking.  We will resume her medications,  consider family session,  Information on Alzheimer's support groups would be  helpful for this patient.  The patient to follow up with Dr. Betti Cruz, continue  to be medication compliant.   TENTATIVE LENGTH OF CARE:  3-4 days.     Landry Corporal, N.P.                        Geoffery Lyons, M.D.    JO/MEDQ  D:  11/25/2003  T:  11/25/2003  Job:  [id number]

## 2011-03-29 NOTE — Cardiovascular Report (Signed)
[facility]  Patient:    [name], [name] Visit Number: [id number] MRN: [id number]          Service Type: CAT Location: [facility] 2899 11 Attending Physician:  Loreli Dollar Dictated by:   Julieanne Manson, M.D. Proc. Date: 03/15/02 Admit Date:  03/15/2002 Discharge Date: 03/15/2002   CC:         Royal Hawthorn B. Darrick Penna, M.D.   Cardiac Catheterization  INDICATIONS FOR TEST:  Ms. Mcclusky is a 55 year old female who has had palpitations and chest pain.  A nuclear study was performed that showed an ejection fraction of 46% and questionable anterior ischemia.  Because of this, she is brought in for outpatient cardiac catheterization.  DESCRIPTION OF PROCEDURE:  The patient was prepped and draped in the usual sterile fashion exposing the right groin.  Applying local anesthetic of 1% Xylocaine, the Seldinger technique was employed and a #5 [place] introducer sheath was placed into the right femoral artery.  Selective left and right coronary arteriography and ventriculography in the RAO projection was performed.  RESULTS:  HEMODYNAMIC MONITORING: 1. Central aortic pressure 106/62. 2. Left ventricular pressure 107/16. 3. No aortic valve gradient noted at the time of pullback.  VENTRICULOGRAPHY:  Ventriculography in the RAO projection revealed normal left ventricular systolic function.  The ejection fraction was 65%.  The end diastolic pressure was 16.  Questionable MVP was noted.  No MR.  CORONARY ARTERIOGRAPHY:  There was no calcification noted on fluoroscopy. 1. Left main:  Normal.  2. LAD:  The LAD extended down towards the apex of the heart and the distal    portion of the LAD was relatively small.  There were 2 diagonal branches    that were free of disease as was the lad.  3. Circumflex:  The circumflex gave rise to 2 OM vessels free of disease.  4. Right coronary artery:  The right coronary artery was a very large dominant    vessel.  The PDA  extended down and touched the apex of the heart.  There    were 5 posterolateral branches, all of which were free of disease.  CONCLUSIONS: 1. No evidence of coronary artery disease. 2. Normal left ventricular systolic function. 3. Questionable mitral valve prolapse.  DISCUSSION:  It appears that the nuclear study was false-positive.  This lady clearly does not have coronary disease.  Will manage her arrhythmias with low-dose beta blockers.  She will need risk factor management. Dictated by:   Julieanne Manson, M.D. Attending Physician:  Loreli Dollar DD:  03/15/02 TD:  03/15/02 Job: [id number]

## 2011-03-29 NOTE — Discharge Summary (Signed)
NAME:  [name], [name]                       ACCOUNT NO.:  [id number]   MEDICAL RECORD NO.:  [id number]                  PATIENT TYPE:  IPS   LOCATION:  0508                                 [facility]ILITY[facility]   PHYSICIAN:  Emma Lyons, MD                     DATE OF BIRTH:  February 16, 1956   DATE OF ADMISSION:  06/22/2002  DATE OF DISCHARGE:  06/25/2002                                 DISCHARGE SUMMARY   CHIEF COMPLAINT AND PRESENT ILLNESS:  This was the first admission to [facility], some previous admissions to [facility]  of [place], for this 55 year old married white female voluntarily  admitted for depression and suicidal ideas.  She had a history of depression  with suicidal ideas with a plan to overdose.  She had increased depression  since May, stress issues with mother, loss of interest, withdrawal,  decreased sleep, appetite satisfactory, no psychosis.  She had heard voices  in the past.  She was compliant with medications but had not been able to  improve in spite of close followup by her primary psychiatrist.   PAST PSYCHIATRIC HISTORY:  This was her first time at [facility]; multiple admissions at [facility] [place].  She saw Dr.  Betti Cruz; last time the day before this admission.   SUBSTANCE ABUSE HISTORY:  Denied the use or abuse of any substances.   PAST MEDICAL HISTORY:  1. Diskogenic disk disease.  2. History of arrhythmia.  3. Irritable bowel syndrome.   MEDICATIONS:  1. Effexor XR 75 mg three times a day.  2. Depakote 500 mg in the morning and Depakote 1000 mg at bedtime.  3. Xanax 2 mg in the morning and 4 mg at bedtime.  4. Cogentin 2 mg at bedtime.  5. Elavil 100 mg at bedtime.  6. Thorazine 100 mg as needed.  7. Hydrocodone 10/650 mg one every 12 hours as needed for back pain.   PHYSICAL EXAMINATION:  Physical examination was performed, failed to show  any acute findings.   MENTAL STATUS EXAM:   Mental status exam revealed an alert, middle-aged  female, cooperative.  Speech: Clear, goal directed.  Mood: Frustrated,  depressed.  Affect: Irritable, flat.  Thought processes: Coherent; no  evidence of psychosis, no auditory and visual hallucinations, no active  suicidal or homicidal ideas. Cognitive: Cognition was well preserved.   ADMISSION DIAGNOSES:   AXIS I:  Bipolar disorder, depressed.   AXIS II:  No diagnosis.   AXIS III:  1. Arrhythmia.  2. Irritable bowel syndrome.  3. Diskogenic disk disease.   AXIS IV:  Moderate.   AXIS V:  Global assessment of functioning upon admission 30, highest global  assessment of functioning in the last year 55.   HOSPITAL COURSE:  She was admitted and started in intensive individual and  group psychotherapy.  Her Effexor was increased to 75  mg three times a day.  She looked at the events that led her to be admitted, how the depression  built up.  She said that the fact that she was removed from her environment  and came here allowed her to start feeling better.  In individual and group,  she was able to do some grief/loss work.  On August 16, she felt she was  much better, denied any suicidal or homicidal ideas, was more hopeful, felt  positive response to the medication and was willing and motivated to go back  to Dr. Betti Cruz and pursue further outpatient followup.   DISCHARGE DIAGNOSES:   AXIS I:  Bipolar disorder, depressed.   AXIS II:  No diagnosis.   AXIS III:  1. Diskogenic disk disease.  2. Irritable bowel syndrome.  3. Arrhythmia.   AXIS IV:  Moderate.   AXIS V:  Global assessment of functioning upon discharge 60.   DISCHARGE MEDICATIONS:  1. Effexor XR 75 mg three times a day.  2. Depakote ER 500 mg in the morning and 1000 at bedtime.  3. Xanax 2 mg in the morning and 4 mg at bedtime.  4. Cogentin 2 mg at bedtime.  5. Digoxin 0.25 mg one daily.  6. Elavil 100 mg at bedtime.  7. Metamucil two three times a day.  8.  Colace 100 mg three times a day.  9. Thorazine 100 mg as needed for anxiety and agitation.   FOLLOW UP:  She was to follow-up with Dr. Betti Cruz and [facility].                                                 Emma Lyons, MD   IL/MEDQ  D:  07/26/2002  T:  07/27/2002  Job:  [id number]

## 2011-03-29 NOTE — Op Note (Signed)
NAMEALIVEA, GLADSON             ACCOUNT NO.:  [id number]   MEDICAL RECORD NO.:  [id number]          PATIENT TYPE:  AMB   LOCATION:  DSC                          FACILITY:  [facility]   PHYSICIAN:  Rodney A. Mortenson, M.D.DATE OF BIRTH:  05-22-1956   DATE OF PROCEDURE:  09/19/2005  DATE OF DISCHARGE:                                 OPERATIVE REPORT   PREOPERATIVE DIAGNOSIS:  Chronic medial epicondylitis, right elbow.   POSTOPERATIVE DIAGNOSIS:  Chronic medial epicondylitis, right elbow.   OPERATION:  Release of flexor medial epicondyle and debridement of  degenerative mucinous flexor wad, small medial epicondylectomy.   SURGEON:  Lenard Galloway. Chaney Malling, M.D.   ANESTHESIA:  General.   PROCEDURE:  The patient was placed on the operating table in supine position  with a pneumatic tourniquet about the right upper arm.  The right upper  extremity was prepped with DuraPrep and draped out in the usual manner.  The  arm was then wrapped with an Esmarch and the tourniquet was elevated.  A  small incision was made over the medial epicondyle and extended distally for  about 1.5 to 2 cm.  The skin edges were retracted.  Blunt dissection was  carried down to the flexor wad.  There was mucinous degeneration in the  tendinous tissues.  Previous injections could be seen.  This area of  degenerative mucinous material was then excised elliptically with a sharp  knife.  The flexor rod was released off the medial epicondyle and a rongeur  was used to clean up the medial epicondyle.  Complete decompression of the  medial epicondyle was achieved and all degenerative material was removed.  The subcutaneous tissue was then closed with 2-0 Vicryl and stainless steel  staples were used to close the skin.  An injection of Marcaine was done and  a sterile dressing was applied.  The patient was returned to the recovery  room in excellent condition.  Technically, the procedure went extremely  well.   FOLLOW  UP:  1.  To my office next week.  2.  Ultram for pain.           ______________________________  Lenard Galloway. Chaney Malling, M.D.     RAM/MEDQ  D:  09/19/2005  T:  09/19/2005  Job:  [id number]

## 2011-03-29 NOTE — Op Note (Signed)
NAMEFAIZA, [name]             ACCOUNT NO.:  [id number]   MEDICAL RECORD NO.:  [id number]          PATIENT TYPE:  AMB   LOCATION:  ENDO                         FACILITY:  [facility]   PHYSICIAN:  Petra Kuba, M.D.    DATE OF BIRTH:  February 15, 1956   DATE OF PROCEDURE:  02/26/2005  DATE OF DISCHARGE:                                 OPERATIVE REPORT   PROCEDURE:  Colonoscopy with biopsy.   ENDOSCOPIST:  Petra Kuba, M.D.   INDICATIONS FOR PROCEDURE:  Abdominal pain, history of diverticula, also  almost due for colonic screening in two months.  The consent was signed  after the risks, benefits, methods and options were thoroughly discussed in  the office.   MEDICINES USED:  Demerol 170 mg, Versed 12 mg.   DESCRIPTION OF PROCEDURE:  A rectal inspection is pertinent for external  hemorrhoids, small.  A digital examination was negative.  The video  pediatric adjustable colonoscope was inserted, and once through a tortuous  spastic sigmoid, was easily able to be advanced around the colon to the  cecum.  This did not require any position changes, but we did use some  abdominal pressure.  No obvious abnormality was seen on insertion.  The  cecum was identified by the appendiceal orifice and the ileal cecal valve.  In fact, the scope was inserted a short ways into the terminal ileum which  was normal.  Photo documentation was obtained.  The scope was slowly  withdrawn.  The prep was fairly adequate.  With lots of washing and  suctioning, adequate visualization was obtained.  The cecum, ascending and  transverse were normal.  As the scope was withdrawn around the left side,  there was some tortuosity, spasm and wall edema.  A few biopsies were  obtained.  I do not think that the edematous folds were due to polyps, but a  few biopsies were obtained, primarily in the sigmoid.  There was some mucus  on the left side of the colon, but no other abnormalities and no diverticula  were seen.  An  anal/rectal pull-through on retroflexion confirmed some small  hemorrhoids.  The scope was drained and re-advanced a short ways up the left  side of the colon.  Air was suctioned and the scope removed.   The patient tolerated the procedure well.  There was no obvious immediate  complication.   ENDOSCOPIC ASSESSMENT:  1.  Internal and external small hemorrhoids.  2.  Tortuous sigmoid, minimal edema and erosions, status post biopsy.  3.  Otherwise normal to the terminal ileum   PLAN:  I will await pathology.  Repeat screening in five years.  Follow up  p.r.n. or in three months, just to check symptoms and make sure no further  workup plans are needed.    MEM/MEDQ  D:  02/26/2005  T:  02/26/2005  Job:  [id number]

## 2011-03-29 NOTE — Discharge Summary (Signed)
NAME:  [name], [name]                       ACCOUNT NO.:  [id number]   MEDICAL RECORD NO.:  [id number]                   PATIENT TYPE:  INP   LOCATION:  3727                                 FACILITY:  [facility]   PHYSICIAN:  Thereasa Solo. Little, M.D.              DATE OF BIRTH:  [birth year]   DATE OF ADMISSION:  11/08/2003  DATE OF DISCHARGE:  11/09/2003                                 DISCHARGE SUMMARY   DISCHARGE DIAGNOSES:  1. Chest pain, myocardial infarction ruled out.  2. Anxiety.  3. History of smoking.   HOSPITAL COURSE:  The patient is a 55 year old female seen in the past by  Dr. Clarene Duke with a history of palpitations and chest pain. She had an  abnormal Cardiolite study and a subsequent normal catheterization in April  2003. She was admitted through the emergency room with chest pain on  November 08, 2003.  She has been on Elavil and Valium at home. Previously,  she had been on Paxil.  She was admitted to telemetry. CK-MB and troponins  were obtained and these were negative for an MI. She was seen in consult by  the family practice service on November 09, 2003. It was decided to resume  her Paxil CR at 25 mg a day and continue Elavil 50 mg at h.s. as well as  Valium h.s. She has been given instructions to contact  Dr. Betti Cruz, her psychiatrist. She does admit to exacerbation of stress; she  apparently cares for her mother-in-law. We had considered sending her home  on a PPI, but it was felt that she had not had symptoms consistent with  reflux for some years since her cholecystectomy. She is discharged on the  afternoon of the 29th.   DISCHARGE MEDICATIONS:  1. Digitek 0.25 mg a day.  2. Paxil CR 25 mg a day.  3. Elavil 50 mg h.s.  4. Valium 20 mg h.s.   LABORATORY:  White count 11.2, hemoglobin 14, hematocrit 40.4, platelet  count 346,000. CK-MB and troponin negative. Sodium 140, potassium 3.7, BUN  8, creatinine 0.6. Magnesium 1.7.  Urinalysis unremarkable.   DISPOSITION:  The patient is discharged in stable condition and will follow  up with Dr. Clarene Duke on December 06, 2003, at 2:15 as well as Dr. Betti Cruz.      Abelino Derrick, P.A.                      Thereasa Solo. Little, M.D.    Lenard Lance  D:  11/09/2003  T:  11/09/2003  Job:  [id number]   cc:   Thereasa Solo. Little, M.D.  [address]  [address]  [place]  [place] [address]  Fax: [phone]   [facility]   Daine Floras, M.D.  [address]  [place]  [place] [address]  Fax: [phone]

## 2011-03-29 NOTE — Discharge Summary (Signed)
[name], [name] NO.:  [id number]   MEDICAL RECORD NO.:  [id number]          PATIENT TYPE:  IPS   LOCATION:  0300                          [facility]ACILITY:  [facility]   PHYSICIAN:  [name], M.D.      DATE OF BIRTH:  [birth year]   DATE OF ADMISSION:  04/11/2005  DATE OF DISCHARGE:  04/11/2005                                 DISCHARGE SUMMARY   CHIEF COMPLAINT AND PRESENT ILLNESS:  This was one of multiple admissions to  [facility] for this 55 year old married white female  voluntarily admitted.  History of spiraling down, depressed, suicidal  thoughts, would not divulge a plan.  Multiple deaths in the family including  her sister was killed in October.  The person who killed her received a very  small sentence and probation.  Upset that she was admitted.  She said she  was no longer suicidal.  She wanted to be discharged as she usually does not  benefit from being in the hospital.   PAST PSYCHIATRIC HISTORY:  Multiple hospitalizations.  Followed by Dr.  Betti [name].   ALCOHOL/DRUG HISTORY:  Denies the use or abuse of any substances.   MEDICAL HISTORY:  Noncontributory.   MEDICATIONS:  Cymbalta 60 mg per day.   PHYSICAL EXAMINATION:  Performed and failed to show any acute findings.   LABORATORY DATA:  Cancelled as the patient wanted to leave.   MENTAL STATUS EXAM:  Fully alert female.  Anxious for being in the unit.  Speech clear, normal rate, tempo and production.  Mood angry for being in  the unit.  Affect angry.  Thought processes logical, coherent and relevant.  Wanting to be discharged.  No delusions.  No suicidal or homicidal ideation.  No hallucinations.  Cognition was well-preserved.   ADMISSION DIAGNOSES:   AXIS I:  Bipolar disorder.   AXIS II:  No diagnosis.   AXIS III:  No diagnosis.   AXIS IV:  Moderate.   AXIS V:  Global Assessment of Functioning upon admission 35; highest Global  Assessment of Functioning in the last year  65.   HOSPITAL COURSE:  She settled in.  She felt that just by talking somewhat  about her problems helped her.  She was able to talk about the feelings, the  losses.  The family was contacted.  They had no concern about her safety.  She was in full contact with reality.  Again, she endorsed she was upset  because of the losses.  She was needing someone to talk to.  After she  talked to assessment person, she felt she was ready to go home but there was  a misunderstanding and she was admitted.  Again, she denied any suicidal or  homicidal ideation.  She was very involved with her [name], Dr. Betti [name], so  we went ahead and discharged to outpatient follow-up.   DISCHARGE DIAGNOSES:   AXIS I:  Bipolar disorder, depressed.   AXIS II:  No diagnosis.   AXIS III:  No diagnosis.   AXIS IV:  Moderate.   AXIS V:  Global Assessment of Functioning upon  discharge 50.   DISCHARGE MEDICATIONS:  Cymbalta 60 mg.   FOLLOW UP:  Continue to see Dr. Betti [name] and [name] for psychotherapy.       IL/MEDQ  D:  05/07/2005  T:  05/07/2005  Job:  [id number]

## 2011-03-29 NOTE — Discharge Summary (Signed)
NAME:  [name], ROUTE                       ACCOUNT NO.:  [id number]   MEDICAL RECORD NO.:  [id number]                   PATIENT TYPE:  IPS   LOCATION:  0506                                 FACILITY:  [facility]   PHYSICIAN:  Geoffery Lyons, M.D.                   DATE OF BIRTH:  April 24, 1956   DATE OF ADMISSION:  11/23/2003  DATE OF DISCHARGE:  11/25/2003                                 DISCHARGE SUMMARY   CHIEF COMPLAINT AND PRESENT ILLNESS:  This was one of several admissions to  [facility] for this 55 year old married white female  voluntarily admitted.  History of depression and suicidal thoughts for one  day with no specific plan.  No specific trigger with her thinking.  Primary  caregiver for her mother-in-law who has Alzheimer's.  Denies any psychotic  symptoms.  Has been compliant with medication.  Sleep and appetite has been  satisfactory.  Upon arrival, she was already anxious to go home.  Found out  that the sister-in-law was going to help care for her mother-in-law.   PAST PSYCHIATRIC HISTORY:  Multiple admissions to [facility].  Sees Dr. Betti Cruz __________ for psychotherapy.  History of mood  disorder with multiple suicidal attempts.   ALCOHOL/DRUG HISTORY:  Denies active use or abuse.   PAST MEDICAL HISTORY:  Chronic back pain, chronic urinary tract infection,  arrhythmia.   MEDICATIONS:  Digoxin 0.25 mg daily, Elavil 100 mg at night, Valium 10 mg in  the morning and 20 mg at night, Paxil CR 25 mg twice a day, Depakote 500 mg  in the morning and 1000 mg at night, Haldol 5 mg three times a day,  hydrocodone 5\500 mg as needed for pain.   PHYSICAL EXAMINATION:  Performed and failed to show any acute findings.   LABORATORY DATA:  Platelet count 423, glucose 116.  CBC with hemoglobin 14,  hemoglobin A1C 6.2, __________287 .  TSH 3.443.   MENTAL STATUS EXAM:  Alert, middle-aged female.  Casually dressed.  Cooperative.  Speech was clear.   Mood was mild depression and anxiety.  Thought processes are coherent.  Affect anxious.  There is no evidence of  psychosis.  No auditory or visual hallucinations.  Cognition well-preserved.   ADMISSION DIAGNOSES:   AXIS I:  Bipolar disorder.   AXIS II:  No diagnosis.   AXIS III:  1. Chronic back pain.  2. Urinary tract infection.   AXIS IV:  Moderate.   AXIS V:  Global Assessment of Functioning upon admission 40; highest Global  Assessment of Functioning in the last year 65-70.   HOSPITAL COURSE:  She was admitted and started intensive individual and  group psychotherapy.  She was maintained on Paxil CR 25 mg twice a day,  Depakote ER 500 mg in the morning and 1000 mg at night, Elavil 100 mg at  night, Valium 10 mg daily p.r.n. for  anxiety, Haldol 5 mg three times a day,  Thorazine 100 mg at night, hydrocodone 5\500 mg every six hours as needed  and Ativan 1 mg IM or p.o. every four hours p.r.n. for agitation.  Once she  came in, she felt that she was ready to go.  She felt that she was only  having a difficult time because of the situation with the mother-in-law,  that help was going to be available to her, so she will not be having to  face the situation alone.  Denied any suicidal or homicidal ideation.  On  November 25, 2003, she was in full contact with reality.  Much improved.  Better insight.  Had worked on Pharmacologist.  Endorsing no suicidal or  homicidal ideation.  Willing and motivated to pursue further outpatient  treatment.   DISCHARGE DIAGNOSES:   AXIS I:  Bipolar disorder, depressed.   AXIS II:  No diagnosis.   AXIS III:  Chronic back pain.   AXIS IV:  Moderate.   AXIS V:  Global Assessment of Functioning upon discharge 55.   DISCHARGE MEDICATIONS:  1. Depakote ER 500 mg, 1 in the morning and 2 at night.  2. Elavil 100 mg at night.  3. Paxil CR 25 mg twice a day.  4. Valium 10 mg at night.  5. Haldol 5 mg three times a day.  6. Thorazine 100 mg at  night.   FOLLOW UP:  Dr. Betti Cruz and [facility].                                               Geoffery Lyons, M.D.    IL/MEDQ  D:  12/14/2003  T:  12/14/2003  Job:  [id number]

## 2011-03-29 NOTE — Consult Note (Signed)
NAME:  [name], [name]                       ACCOUNT NO.:  [id number]   MEDICAL RECORD NO.:  [id number]                   PATIENT TYPE:  INP   LOCATION:  3727                                 FACILITY:  [facility]   PHYSICIAN:  Noelle C. Merilynn Finland, M.D.           DATE OF BIRTH:  [birth year]   DATE OF CONSULTATION:  11/09/2003  DATE OF DISCHARGE:                                   CONSULTATION   CONSULTING SERVICE:  Conservation officer, historic buildings.   SERVICE REQUESTING CONSULTATION:  [facility].   REASON FOR CONSULTATION:  1. Anxiety.  2. Bipolar disorder.  3. Evaluate for possible GI source of chest pain.   HISTORY OF PRESENT ILLNESS:  The patient is a 55 year old Caucasian female  who was admitted by [facility] for chest pain  and rule out myocardial infarction.  She has ruled out myocardial infarction  and is not going to undergo any further evaluation by her cardiologist.  We  are asked to see her regarding her anxiety and psychiatric issues, and also  to evaluate for possible GI sources of chest pain.  She currently denies any  chest pain and desires discharge.   Her history is significant for bipolar disorder and anxiety, and she cares  for her demented, elderly mother-in-law at home.  Of note, she sees Dr.  Betti Cruz as her outpatient psychiatrist who doses her psychiatric medications.   REVIEW OF SYSTEMS:  No shortness of breath, no chest pain, no rashes.  No  reflux symptoms.   PAST MEDICAL HISTORY:  1. Bipolar disorder.  2. Palpitations.  3. Gastroesophageal reflux disease.   MEDICATIONS:  1. Aspirin 81 mg p.o. daily.  2. Elavil 50 mg p.o. q.h.s.  3. Lanoxin 0.25 mg p.o. daily.  4. Valium 20 mg p.o. q.h.s. and p.r.n.   ALLERGIES:  No known drug allergies.   PAST SURGICAL HISTORY:  Cholecystectomy several years ago.   FAMILY HISTORY:  Father has coronary artery disease.  Mother has bipolar  disorder.  Paternal  grandfather had an MI in his 54's.   SOCIAL HISTORY:  She is married and lives with her husband and mother-in-law  who has dementia.  She cares for her mother-in-law basically 24 hours a day  for 27 days out of the month.  She is a current smoker, but no alcohol or  drug use.   PHYSICAL EXAMINATION:  VITAL SIGNS:  Vital signs were reviewed.  GENERAL:  Alert and oriented x4, no apparent distress.  HEENT:  No icterus.  Extraocular movements are intact, external ears and  nose normal.  NECK:  No masses, no thyromegaly, no lymphadenopathy.  CARDIOVASCULAR:  Regular rate and rhythm, no murmurs.  LUNGS:  Clear to auscultation bilaterally.  ABDOMEN:  Soft, nontender, nondistended.  No hepatosplenomegaly.  EXTREMITIES:  No clubbing, cyanosis or edema.  SKIN:  No rashes.   LABORATORY DATA:  Cardiac enzymes are negative x3  sets.  Electrolytes are  unremarkable.  Digoxin level is less than 0.2.  TSH 0.908.  Magnesium 1.7.   CBC shows WBC 11.2, hemoglobin 14, platelets 346.  Portable chest x-ray  shows borderline cardiomegaly and atelectasis.   ASSESSMENT/PLAN:  1. This is a 55 year old Caucasian female here for chest pain, who has a     history of bipolar disorder and anxiety.  The patient does report     significant stress at home especially with her mother-in-law and feels     this is likely the source of her chest pain. She sees Dr. Betti Cruz as her     outpatient psychiatrist on a regular basis for her bipolar disorder.  She     would like to restart Paxil which she has been on in the past with good     result, and she actually has a bottle of Paxil CR 25 mg at home.     Recommend continuing her current Elavil and Valium and starting Paxil CR     25 mg p.o. daily.  We did discuss the small risk of combing an SSRI with     a PCA with regards to increasing the risk for tricyclic toxicity.  She     understands the small risk and agrees that it seems reasonable to try     this given her very low  dose of Elavil currently.  She has an appointment     in February with her psychiatrist, but she plans to call to move this     appointment up to the next two or three weeks.  We offered to schedule     this appointment for her, but she declined and wishes to do this on her     own.  2. Doubt any GI source of her chest pain.  She reports having indigestion in     the past which resolved with her cholecystectomy several years ago.  She     had been on proton pump inhibitor at that time, but has not needed this     in several years.  Also she reports this recent chest pain did not     resemble her prior reflux symptoms.  3. Okay to discharge home.                                               Noelle C. Merilynn Finland, M.D.    NCR/MEDQ  D:  11/09/2003  T:  11/09/2003  Job:  [id number]

## 2011-03-29 NOTE — H&P (Signed)
NAME:  [name], [name]                       ACCOUNT NO.:  [id number]   MEDICAL RECORD NO.:  [id number]                   PATIENT TYPE:  IPS   LOCATION:  0508                                 FACILITY:  [facility]   PHYSICIAN:  Landry Corporal, NP                   DATE OF BIRTH:  Nov 04, 1956   DATE OF ADMISSION:  06/22/2002  DATE OF DISCHARGE:  06/25/2002                         PSYCHIATRIC ADMISSION ASSESSMENT   IDENTIFYING INFORMATION:  The patient is a 55 year old married white female  voluntarily admitted for depression and suicidal ideation.   HISTORY OF PRESENT ILLNESS:  The patient presents with a history of  depression with suicidal thoughts with a plan overdose.  Her depression has  been increasing since May.  She has been stressed with issues with her  mother.  She has become more withdrawn, having loss of interest in her  activities.  She reports decreased sleep over the past few months.  Appetite  has been satisfactory.  Denies any psychotic symptoms.  States she has heard  voices in the past.  She has been compliant with her medications.  She  promises safety on the unit.   PAST PSYCHIATRIC HISTORY:  First visit to [facility].  Multiple admissions to [facility].  Sees Dr. Betti Cruz; last visit was 06/22/02.  Has a history of bipolar disorder, has a history of a suicide attempt where  she overdosed about four to five years ago.   SUBSTANCE ABUSE HISTORY:  She smokes.  She denies any alcohol or substance  abuse.   PAST MEDICAL HISTORY:  Primary care [place]: Dr. Alvina Chou at [facility].  Medical problems are degenerative disk disease, stage L4  is bulging; has a history of arrhythmia, and irritable bowel syndrome.   MEDICATIONS:  1. Digitalis 0.25 mg; has been on this since April prescribed by Dr. Clarene Duke.  2. Xanax 2 mg one tablet after lunch, 4 mg q.h.s.  3. Effexor XR 75 mg t.i.d.; has been on this for years.  4. Depakote 500 mg q.a.m., 1000 mg  q.h.s. for 10 years.  5. Cogentin 2 mg at h.s.  6. Elavil 100 mg at h.s.   DRUG ALLERGIES:  No know allergies.   PHYSICAL EXAMINATION:  VITAL SIGNS:  Temperature 97, pulse 88, respirations  20, blood pressure 100/64.  The patient is 201 pounds.  She is 5 feet 9  inches tall.   GENERAL:  The patient is a 54 year old Caucasian female in no acute  distress.  She is overweight, appears her stated age, fairly groomed, alert,  and cooperative.   HEENT:  Head: Normocephalic.  Hair is long, dyed, but evenly distributed.  EOMs are intact.  External ear canals are patent.  Hearing is appropriate to  conversation.  No sinus tenderness.  Mucosa is moist with bridges, no  lesions were seen.  Tongue protrudes to midline without tremor.  She can  clench her  teeth and puff out her cheeks.  No pharyngeal exudate.   NECK:  Supple, no JVD, negative lymphadenopathy.  Thyroid is nonpalpable,  nontender.   CHEST:  Clear to auscultation.  No cough.   HEART:  Regular rate and rhythm without murmurs, gallops, or rubs.  Carotid  pulses are equal and adequate.   BREAST:  Exam was deferred.   MUSCULOSKELETAL:  No joint swelling or deformity.  Good range of motion.  Upper muscle strength and tone is equal.  Some weakness to right lower leg.  No signs of injury.   SKIN:  Warm and dry with good turgor.  Nailbeds are pink with good capillary  refill.  No rashes or lacerations.   NEUROLOGIC:  Oriented x 3.  Cranial nerves grossly intact.  Cerebellar  function is intact with normal alternating movements.   LABORATORY DATA:  CBC: WBC count elevated at 11.2.  CMET is within normal  limits.   SOCIAL HISTORY:  This is a 55 year old married white female, married for 15  years, second marriage.  Has four children ages 58, 74, 48, and 34.  She  lives with her husband.  She is on disability for medical and psychiatric  illness.  Completed one year of college.  No legal problems.   FAMILY HISTORY:  Mother and  sister with bipolar.   MENTAL STATUS EXAM:  She is an alert, middle-aged, semi cooperative female  with fair eye contact.  Speech is clear.  Mood: The patient feels  frustrated.  Affect is irritable, flat.  Thought processes are coherent.  There is no evidence of psychosis, no auditory and visual hallucinations, no  suicidal or homicidal ideation or paranoia.  Cognitive: Intact.  Memory is  fair.  Judgment and insight are fair.   ADMISSION DIAGNOSES:  Axis I: Bipolar disorder, depressed.  Axis II:    Deferred.  Axis III:  1. Degenerative disk disease.  2. Irritable bowel syndrome.  3. Arrhythmia.  Axis IV:    Problems with primary support group, other psychosocial  problems, medical problems.  Axis V:     Current is 30, this past year is 83.   INITIAL PLAN OF CARE:  Plan is a voluntary admission to [facility] for depression and suicidal ideation.  Contract for safety, check  every 15 minutes.  Will resume her routine medications.  Will obtain labs.  Will  check a digoxin level.  The patient is to attend groups, to increase coping  skills, to follow-up with Dr. Betti Cruz, to stabilize her mood and thinking so  the patient can be safe.   ESTIMATED LENGTH OF STAY:  Three to four days.                                                  Landry Corporal, NP    JO/MEDQ  D:  06/25/2002  T:  06/30/2002  Job:  [id number]

## 2011-08-05 LAB — I-STAT 8, (EC8 V) (CONVERTED LAB)
Acid-Base Excess: 2
Hemoglobin: 13.3
Potassium: 3.1 — ABNORMAL LOW
Sodium: 143
TCO2: 28

## 2011-08-05 LAB — DIFFERENTIAL
Basophils Absolute: 0.1
Lymphocytes Relative: 32
Monocytes Absolute: 0.5
Neutro Abs: 7.5

## 2011-08-05 LAB — D-DIMER, QUANTITATIVE: D-Dimer, Quant: 0.33

## 2011-08-05 LAB — CBC
Hemoglobin: 12.7
Platelets: 320
RDW: 16.3 — ABNORMAL HIGH

## 2011-08-08 LAB — CBC
HCT: 41.2
MCV: 98.3
Platelets: 344
RDW: 14.6
WBC: 10.5

## 2011-08-08 LAB — URINALYSIS, ROUTINE W REFLEX MICROSCOPIC
Glucose, UA: NEGATIVE
Hgb urine dipstick: NEGATIVE
Ketones, ur: 15 — AB
Protein, ur: NEGATIVE

## 2011-08-09 LAB — ELECTROLYTE PANEL
CO2: 28
Chloride: 104

## 2011-08-09 LAB — URINALYSIS, ROUTINE W REFLEX MICROSCOPIC
Bilirubin Urine: NEGATIVE
Nitrite: NEGATIVE
Protein, ur: NEGATIVE
Specific Gravity, Urine: 1.015
Urobilinogen, UA: 0.2

## 2011-08-09 LAB — CBC
HCT: 45.8
Platelets: 402 — ABNORMAL HIGH
RDW: 14.5

## 2011-08-09 LAB — URINE MICROSCOPIC-ADD ON

## 2011-08-27 ENCOUNTER — Telehealth: Payer: Self-pay | Admitting: *Deleted

## 2011-08-27 NOTE — Telephone Encounter (Signed)
Patient called c/o "ovulation type pain" come and go the past week or so.  Advised may need ov, and due for annual exam.  She said she had her grandchild there and she would call back to schedule.

## 2011-10-29 ENCOUNTER — Other Ambulatory Visit: Payer: Self-pay

## 2012-02-27 DIAGNOSIS — D484 Neoplasm of uncertain behavior of peritoneum: Secondary | ICD-10-CM | POA: Insufficient documentation

## 2012-02-27 DIAGNOSIS — N816 Rectocele: Secondary | ICD-10-CM | POA: Insufficient documentation

## 2012-02-27 DIAGNOSIS — D4959 Neoplasm of unspecified behavior of other genitourinary organ: Secondary | ICD-10-CM | POA: Insufficient documentation

## 2012-02-27 DIAGNOSIS — N393 Stress incontinence (female) (male): Secondary | ICD-10-CM | POA: Insufficient documentation

## 2012-02-27 DIAGNOSIS — B009 Herpesviral infection, unspecified: Secondary | ICD-10-CM | POA: Insufficient documentation

## 2012-03-09 ENCOUNTER — Encounter: Payer: Self-pay | Admitting: Obstetrics and Gynecology

## 2012-03-20 ENCOUNTER — Ambulatory Visit (INDEPENDENT_AMBULATORY_CARE_PROVIDER_SITE_OTHER): Payer: Medicare Other | Admitting: Obstetrics and Gynecology

## 2012-03-20 ENCOUNTER — Encounter: Payer: Self-pay | Admitting: Obstetrics and Gynecology

## 2012-03-20 VITALS — BP 112/68 | Ht 68.5 in | Wt 173.0 lb

## 2012-03-20 DIAGNOSIS — R3915 Urgency of urination: Secondary | ICD-10-CM

## 2012-03-20 DIAGNOSIS — N952 Postmenopausal atrophic vaginitis: Secondary | ICD-10-CM

## 2012-03-20 DIAGNOSIS — R351 Nocturia: Secondary | ICD-10-CM

## 2012-03-20 LAB — URINALYSIS W MICROSCOPIC + REFLEX CULTURE
Bilirubin Urine: NEGATIVE
Crystals: NONE SEEN
Leukocytes, UA: NEGATIVE
Nitrite: NEGATIVE
Specific Gravity, Urine: 1.01 (ref 1.005–1.030)
Urobilinogen, UA: 0.2 mg/dL (ref 0.0–1.0)

## 2012-03-20 NOTE — Progress Notes (Signed)
Patient came to see me today for further followup. She continues to have trouble with urinary urgency, nocturia, without dysuria or hematuria. She will occasionally lose a little  urine if she does not make it to the bathroom in time. She continues to have atrophic vaginitis which we have discussed previously. She is currently using a lubricant and due to  infrequency of intercourse feels that this is fine. She is having no vaginal bleeding. She is having no pelvic pain. She is due for a mammogram and we'll schedule. She had a normal bone density in 2011. She had a colonoscopy in 2011 and several polyps removed. We have previously seen her for left lower quadrant pain which is now resolved. She has had 3 sore throats this spring. She was cultured for strep in April at her PCP and was negative.  Review of systems: 12 system review done. Pertinent positives above. Other positives include bipolar disorder, orthostatic hypotension, and cystic mesothelioma.  HEENT: Within normal limits. Beola Cord  present. Neck: No masses. Back of mouth visualized and no evidence of pharyngitis or enlarged tonsils Supraclavicular lymph nodes: Not enlarged. Breasts: Examined in both sitting and lying position. Symmetrical without skin changes or masses. Abdomen: Soft no masses guarding or rebound. No hernias. Pelvic: External within normal limits. BUS within normal limits. Vaginal examination shows poor estrogen effect, no cystocele enterocele or rectocele. Cervix and uterus absent. Adnexa within normal limits. Rectovaginal confirmatory. Extremities within normal limits.  Assessment: #1. Atrophic vaginitis #2. Urinary urgency #3. Urinary frequency #4. Left lower quadrant pain resolved #5. Frequent sore throats.  Plan: Continued observation of the above symptoms. ENT consult-patient given names. Mammogram.

## 2012-03-22 LAB — URINE CULTURE: Organism ID, Bacteria: NO GROWTH

## 2012-06-18 ENCOUNTER — Ambulatory Visit (HOSPITAL_COMMUNITY)
Admission: AD | Admit: 2012-06-18 | Discharge: 2012-06-18 | Disposition: A | Discharge status: Home / Self Care | Payer: Medicare Other | Attending: Psychiatry | Admitting: Psychiatry

## 2012-06-18 ENCOUNTER — Encounter (HOSPITAL_COMMUNITY): Payer: Self-pay | Admitting: Licensed Clinical Social Worker

## 2012-06-18 DIAGNOSIS — F314 Bipolar disorder, current episode depressed, severe, without psychotic features: Secondary | ICD-10-CM | POA: Insufficient documentation

## 2012-06-18 HISTORY — DX: Mental disorder, not otherwise specified: F99

## 2012-06-18 HISTORY — DX: Anxiety disorder, unspecified: F41.9

## 2012-06-18 HISTORY — DX: Major depressive disorder, single episode, unspecified: F32.9

## 2012-06-18 HISTORY — DX: Depression, unspecified: F32.A
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
## 2012-10-22 ENCOUNTER — Other Ambulatory Visit: Payer: Self-pay | Admitting: Obstetrics and Gynecology

## 2012-10-26 ENCOUNTER — Ambulatory Visit (INDEPENDENT_AMBULATORY_CARE_PROVIDER_SITE_OTHER): Payer: Medicare Other | Admitting: Obstetrics and Gynecology

## 2012-10-26 DIAGNOSIS — N644 Mastodynia: Secondary | ICD-10-CM

## 2012-10-26 NOTE — Patient Instructions (Signed)
We will schedule mammogram. Vitamin E  400 mg daily. Warm soaks to breasts. Advil  for pain relief. Call in 2 weeks if  still having pain.

## 2012-10-26 NOTE — Progress Notes (Signed)
Patient came to see me today with a 10 day history of pain in her left side which radiates to the lower part of her breast. She does not take hormone replacement therapy. She is felt no lump. She does not have a lot of caffeine. She says if she coughs or raises her arm the pain is worse. It was the worst 4 days ago and is somewhat better now. Her last mammogram was April, 2011.  Exam: Kennon Portela present. Both her breasts were carefully examined and the sitting midline position. There are no skin changes. There are no masses in either breast. Her left breast was not particularly tender today.  Assessment: Mastodynia versus musculoskeletal sprain.  Plan: Diagnostic mammogram. Vitamin E 400 mg daily. Advil for pain. Consider danazol if still present in 2 weeks. Warm soaks.

## 2012-10-27 ENCOUNTER — Telehealth: Payer: Self-pay | Admitting: *Deleted

## 2012-10-27 DIAGNOSIS — N644 Mastodynia: Secondary | ICD-10-CM

## 2012-10-27 NOTE — Telephone Encounter (Signed)
Appt. 11/05/12 @ 1:20 pm

## 2012-10-27 NOTE — Telephone Encounter (Signed)
Order placed for the below 

## 2012-10-27 NOTE — Telephone Encounter (Signed)
Message copied by Aura Camps on Tue Oct 27, 2012  8:33 AM ------      Message from: Richardson Chiquito      Created: Mon Oct 26, 2012  4:40 PM                   ----- Message -----         From: Trellis Paganini, MD         Sent: 10/26/2012   3:06 PM           To: Janus Molder, CMA            Please schedule patient for diagnostic mammogram of left breast and screening mammogram of right breast at [facility] as soon as possible. Diagnosis is mastodynia in the lower part of left breast.

## 2012-11-05 ENCOUNTER — Ambulatory Visit
Admission: RE | Admit: 2012-11-05 | Discharge: 2012-11-05 | Disposition: A | Discharge status: Home / Self Care | Payer: Medicare Other | Source: Ambulatory Visit | Attending: Obstetrics and Gynecology | Admitting: Obstetrics and Gynecology

## 2012-11-05 DIAGNOSIS — N644 Mastodynia: Secondary | ICD-10-CM

## 2012-12-31 ENCOUNTER — Emergency Department (HOSPITAL_BASED_OUTPATIENT_CLINIC_OR_DEPARTMENT_OTHER)
Admission: EM | Admit: 2012-12-31 | Discharge: 2012-12-31 | Disposition: A | Discharge status: Home / Self Care | Payer: Medicare Other | Attending: Emergency Medicine | Admitting: Emergency Medicine

## 2012-12-31 ENCOUNTER — Encounter (HOSPITAL_BASED_OUTPATIENT_CLINIC_OR_DEPARTMENT_OTHER): Payer: Self-pay | Admitting: Emergency Medicine

## 2012-12-31 DIAGNOSIS — M25559 Pain in unspecified hip: Secondary | ICD-10-CM | POA: Insufficient documentation

## 2012-12-31 DIAGNOSIS — Z8742 Personal history of other diseases of the female genital tract: Secondary | ICD-10-CM | POA: Insufficient documentation

## 2012-12-31 DIAGNOSIS — M2550 Pain in unspecified joint: Secondary | ICD-10-CM | POA: Insufficient documentation

## 2012-12-31 DIAGNOSIS — M79605 Pain in left leg: Secondary | ICD-10-CM

## 2012-12-31 DIAGNOSIS — M549 Dorsalgia, unspecified: Secondary | ICD-10-CM | POA: Insufficient documentation

## 2012-12-31 DIAGNOSIS — G8929 Other chronic pain: Secondary | ICD-10-CM | POA: Insufficient documentation

## 2012-12-31 DIAGNOSIS — F3289 Other specified depressive episodes: Secondary | ICD-10-CM | POA: Insufficient documentation

## 2012-12-31 DIAGNOSIS — F17200 Nicotine dependence, unspecified, uncomplicated: Secondary | ICD-10-CM | POA: Insufficient documentation

## 2012-12-31 DIAGNOSIS — Z79899 Other long term (current) drug therapy: Secondary | ICD-10-CM | POA: Insufficient documentation

## 2012-12-31 DIAGNOSIS — Z8543 Personal history of malignant neoplasm of ovary: Secondary | ICD-10-CM | POA: Insufficient documentation

## 2012-12-31 DIAGNOSIS — Z7982 Long term (current) use of aspirin: Secondary | ICD-10-CM | POA: Insufficient documentation

## 2012-12-31 DIAGNOSIS — Z8619 Personal history of other infectious and parasitic diseases: Secondary | ICD-10-CM | POA: Insufficient documentation

## 2012-12-31 DIAGNOSIS — Z8659 Personal history of other mental and behavioral disorders: Secondary | ICD-10-CM | POA: Insufficient documentation

## 2012-12-31 DIAGNOSIS — IMO0002 Reserved for concepts with insufficient information to code with codable children: Secondary | ICD-10-CM | POA: Insufficient documentation

## 2012-12-31 DIAGNOSIS — F329 Major depressive disorder, single episode, unspecified: Secondary | ICD-10-CM | POA: Insufficient documentation

## 2012-12-31 DIAGNOSIS — F411 Generalized anxiety disorder: Secondary | ICD-10-CM | POA: Insufficient documentation

## 2012-12-31 MED ORDER — HYDROMORPHONE HCL PF 2 MG/ML IJ SOLN
2.0000 mg | Freq: Once | INTRAMUSCULAR | Status: AC
  Administered 2012-12-31: 2 mg via INTRAMUSCULAR
  Filled 2012-12-31: qty 1

## 2012-12-31 MED ORDER — ONDANSETRON 4 MG PO TBDP: ORAL_TABLET | ORAL | Status: DC

## 2012-12-31 MED ORDER — ONDANSETRON 4 MG PO TBDP
4.0000 mg | ORAL_TABLET | Freq: Once | ORAL | Status: AC
  Administered 2012-12-31: 4 mg via ORAL
  Filled 2012-12-31: qty 1

## 2012-12-31 NOTE — ED Notes (Signed)
Pt asked nurse " please tell me you didn't given me a placebo nausea and pain meds, because i dont feel it"

## 2012-12-31 NOTE — ED Notes (Signed)
Pt has chronic pain to back and sciatic nerve, pt reports that pain is so severe she can not keep her current pain medication down, she throws it up, pt called and spoke with md on call and was told to come to er for antinausea medication, although she reports she has taken phenegran without relief

## 2012-12-31 NOTE — ED Notes (Signed)
Family at bedside. 

## 2012-12-31 NOTE — ED Notes (Signed)
Pt reports that see last saw dr. Yisroel Ramming her orthopedic md on feb 5th, medications were refilled at that time

## 2012-12-31 NOTE — ED Notes (Signed)
MD at bedside. 

## 2012-12-31 NOTE — ED Provider Notes (Signed)
History  This chart was scribed for Emma Racer, MD by [name], ED Scribe. This patient was seen in room MH12/MH12 and the patient's care was started at 8:53 PM.  CSN: [id number]  Arrival date & time 12/31/12  2004   First MD Initiated Contact with Patient 12/31/12 2053      Chief Complaint  Patient presents with  . Leg Pain     Patient is a 57 y.o. female presenting with leg pain. The history is provided by the patient. No language interpreter was used.  Leg Pain Location:  Hip Time since incident:  5 days Injury: no   Hip location:  L hip Pain details:    Onset quality:  Gradual   Timing:  Constant   Progression:  Worsening Chronicity:  New Dislocation: no   Prior injury to area:  No Relieved by:  Nothing Associated symptoms: back pain (chronic)   Associated symptoms: no decreased ROM, no fever, no muscle weakness and no numbness     [name] is a 57 y.o. female who presents to the Emergency Department complaining of 4 to 5 days of gradual onset, gradually worsening, constant left posterior hip pain that radiates mildly outward. The pain is worse with laying on the left side and she denies improving modifying factors. She reports taking 600 to 800 mg ibuprofen in the morning and again at night with no improvement. She reports seeing Dr. Francee [name] (her PCP) and Dr. Jerl [name] for the chronic back pain diagnosed as bursitis, but she reports that this pain is different from her chronic pain. She states that she has a prescription for 5-325 mg Norco for the chronic pain but states that it makes her feel nauseated and the phenergan is ineffective against the nausea. She denies having nausea currently. She denies any rash, new numbness or weakness as associated symptoms. She also has a h/o anxiety and depression and is a current everyday smoker but denies alcohol use.  Past Medical History  Diagnosis Date  . Cystic mesothelioma     LARGE MESOTHELIAL CYST  . SUI  (stress urinary incontinence, female)   . Ovarian neoplasm     LARGE OVARIAN NEOPLASM  . Rectocele, female   . 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 Mental disorder   . Depression   . Anxiety     Past Surgical History  Procedure Laterality Date  . Oophorectomy  1998    LSO  . Abdominal hysterectomy  1982    TAH  . Posterior repair  1985  . Cholecystectomy    . Laminectomy    . Knee surgery      Family History  Problem Relation Age of Onset  . Diabetes Father   . Heart disease Father   . Diabetes Paternal Grandmother   . Heart disease Paternal Grandfather     History  Substance Use Topics  . Smoking status: Current Every Day Smoker -- 1.00 packs/day    Types: Cigarettes  . Smokeless tobacco: Never Used  . Alcohol Use: No    OB History   Grav Para Term Preterm Abortions TAB SAB Ect Mult Living   9 5   4  4   4       Review of Systems  Constitutional: Negative for fever and chills.  Musculoskeletal: Positive for back pain (chronic) and arthralgias (left hip pain).  Skin: Negative for rash.  Neurological: Negative for weakness and numbness.  All other systems reviewed and are negative.    Allergies  Codeine and Diphenhydramine hcl  Home Medications   Current Outpatient Rx  Name  Route  Sig  Dispense  Refill  . alprazolam (XANAX) 2 MG tablet   Oral   Take 2 mg by mouth 3 (three) times daily as needed.           Marland Kitchen aspirin 81 MG tablet   Oral   Take 81 mg by mouth daily.           . digoxin (LANOXIN) 0.25 MG tablet   Oral   Take 250 mcg by mouth daily.           . flurandrenolide (CORDRAN) 0.05 % lotion   Topical   Apply topically 2 (two) times daily.         Marland Kitchen HYDROcodone-acetaminophen (NORCO) 5-325 MG per tablet   Oral   Take 1 tablet by mouth every 6 (six) hours as needed.         [name] 300 MG CAPS   Oral   Take by mouth.         . promethazine (PHENERGAN) 12.5 MG tablet   Oral   Take 12.5-25 mg by mouth 3 (three) times  daily as needed.           . ondansetron (ZOFRAN ODT) 4 MG disintegrating tablet      4mg  ODT q4 hours prn nausea/vomit   4 tablet   0     Triage Vitals: BP 134/60  Pulse 95  Temp(Src) 98 F (36.7 C) (Oral)  Resp 18  Ht 5\' 9"  (1.753 m)  Wt 168 lb (76.204 kg)  BMI 24.8 kg/m2  SpO2 100%  Physical Exam  Nursing note and vitals reviewed. Constitutional: She is oriented to person, place, and time. She appears well-developed and well-nourished. No distress.  HENT:  Head: Normocephalic and atraumatic.  Eyes: EOM are normal.  Neck: Neck supple. No tracheal deviation present.  Cardiovascular: Normal rate.   Pulmonary/Chest: Effort normal. No respiratory distress.  Abdominal: Soft. There is no tenderness.  Musculoskeletal: Normal range of motion. She exhibits tenderness. She exhibits no edema.  Mild tenderness to palpation over the left greater trochanter, moderate tenderness to palpation over the left ischium, no signs of deformities, masses, lesions or infection, negative straight leg raise bilaterally, full ROM without pain.  Neurological: She is alert and oriented to person, place, and time.  5/5 strength in all extremities, sensation intact, no saddle anesthesia   Skin: Skin is warm and dry.  Psychiatric: She has a normal mood and affect. Her behavior is normal.    ED Course  Procedures (including critical care time)  DIAGNOSTIC STUDIES: Oxygen Saturation is 100% on room air, normal by my interpretation.    COORDINATION OF CARE: 9:07 PM-Discussed treatment plan which includes Zofran and Dilaudid with pt at bedside and pt agreed to plan. Advised pt that she will need to call a ride and pt agreed.  9:15 PM- Ordered 4 mg Zofran tablet and 2 mg Dilaudid injection  10:48 PM-Pt rechecked and feels improved with medication listed above, seen ambulating around the ED. Discussed discharge plan which includes Zofran prescription with pt and pt agreed to plan. Also advised pt to  follow up with PCP and pt agreed.  Labs Reviewed - No data to display No results found.   1. Leg pain, posterior, left       MDM  I personally performed the services described in this documentation, which was scribed in my presence.  The recorded information has been reviewed and is accurate.    Emma Racer, MD 12/31/12 2308

## 2012-12-31 NOTE — ED Notes (Signed)
Pt states that her husband can pick here up

## 2012-12-31 NOTE — ED Notes (Signed)
Pt instructed unable to drive x 4 hrs D/T pain meds given

## 2012-12-31 NOTE — ED Notes (Signed)
Pt told to call husband for ride and that he must come in to her room

## 2012-12-31 NOTE — ED Notes (Signed)
Pt reports constant pain to left buttock, that started 5 days ago pain is 10/10

## 2013-10-01 ENCOUNTER — Other Ambulatory Visit: Payer: Self-pay

## 2013-10-01 DIAGNOSIS — Z1231 Encounter for screening mammogram for malignant neoplasm of breast: Secondary | ICD-10-CM

## 2013-11-08 ENCOUNTER — Ambulatory Visit
Admission: RE | Admit: 2013-11-08 | Discharge: 2013-11-08 | Disposition: A | Discharge status: Home / Self Care | Payer: Medicare Other | Source: Ambulatory Visit

## 2013-11-08 DIAGNOSIS — Z1231 Encounter for screening mammogram for malignant neoplasm of breast: Secondary | ICD-10-CM

## 2013-12-02 ENCOUNTER — Encounter: Payer: Self-pay | Admitting: Gynecology

## 2013-12-30 ENCOUNTER — Encounter: Payer: Self-pay | Admitting: Gynecology

## 2014-01-19 ENCOUNTER — Encounter: Payer: Self-pay | Admitting: Gynecology

## 2014-01-19 ENCOUNTER — Ambulatory Visit (INDEPENDENT_AMBULATORY_CARE_PROVIDER_SITE_OTHER): Payer: Medicare HMO | Admitting: Gynecology

## 2014-01-19 VITALS — BP 112/70 | Ht 68.0 in | Wt 160.0 lb

## 2014-01-19 DIAGNOSIS — Z01419 Encounter for gynecological examination (general) (routine) without abnormal findings: Secondary | ICD-10-CM

## 2014-01-19 DIAGNOSIS — N951 Menopausal and female climacteric states: Secondary | ICD-10-CM

## 2014-01-19 DIAGNOSIS — F17200 Nicotine dependence, unspecified, uncomplicated: Secondary | ICD-10-CM

## 2014-01-19 DIAGNOSIS — Z8742 Personal history of other diseases of the female genital tract: Secondary | ICD-10-CM

## 2014-01-19 DIAGNOSIS — Z9889 Other specified postprocedural states: Secondary | ICD-10-CM

## 2014-01-19 DIAGNOSIS — Z780 Asymptomatic menopausal state: Secondary | ICD-10-CM

## 2014-01-19 DIAGNOSIS — N952 Postmenopausal atrophic vaginitis: Secondary | ICD-10-CM

## 2014-01-19 NOTE — Progress Notes (Signed)
[name] November 03, 1956 [id number]   History:    58 y.o.  for annual gyn exam with the only complaint of vaginal dryness. Review of patient's records indicate the following: 1982 total abdominal hysterectomy 1998 left salpingo-oophorectomy for large benign mesothelial cyst 2009 laparoscopic lysis of pelvic adhesions  Patient would no prior history of abnormal Pap smears. Patient several years ago had been on HRT for 3 months and discontinued it. Patient has been smoking for many years and states that she smokes three fourths of a pack daily. 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 Patient's last bone density study was normal in 2011. Patient had a colonoscopy in 2011 benign polyps had been removed. Patient's PCP is Dr. Coletta Memos who has been doing her blood work. See past medical history and medication list for further details.  Past medical history,surgical history, family history and social history were all reviewed and documented in the EPIC chart.  Gynecologic History No LMP recorded. Patient has had a hysterectomy. Contraception: post menopausal status Last Pap: 2011. Results were: normal Last mammogram: December 2014. Results were: normal  Obstetric History OB History  Gravida Para Term Preterm AB SAB TAB Ectopic Multiple Living  9 5   4 4    4     # Outcome Date GA Lbr Len/2nd Weight Sex Delivery Anes PTL Lv  9 SAB           8 SAB           7 SAB           6 SAB           5 PAR           4 PAR           3 PAR           2 PAR           1 PAR                ROS: A ROS was performed and pertinent positives and negatives are included in the history.  GENERAL: No fevers or chills. HEENT: No change in vision, no earache, sore throat or sinus congestion. NECK: No pain or stiffness. CARDIOVASCULAR: No chest pain or pressure. No palpitations. PULMONARY: No shortness of breath, cough or wheeze. GASTROINTESTINAL: No abdominal pain,  nausea, vomiting or diarrhea, melena or bright red blood per rectum. GENITOURINARY: No urinary frequency, urgency, hesitancy or dysuria. MUSCULOSKELETAL: No joint or muscle pain, no back pain, no recent trauma. DERMATOLOGIC: No rash, no itching, no lesions. ENDOCRINE: No polyuria, polydipsia, no heat or cold intolerance. No recent change in weight. HEMATOLOGICAL: No anemia or easy bruising or bleeding. NEUROLOGIC: No headache, seizures, numbness, tingling or weakness. PSYCHIATRIC: No depression, no loss of interest in normal activity or change in sleep pattern.     Exam: chaperone present  BP 112/70  Ht 5\' 8"  (1.727 m)  Wt 160 lb (72.576 kg)  BMI 24.33 kg/m2  Body mass index is 24.33 kg/(m^2).  General appearance : Well developed well nourished female. No acute distress HEENT: Neck supple, trachea midline, no carotid bruits, no thyroidmegaly Lungs: Clear to auscultation, no rhonchi or wheezes, or rib retractions  Heart: Regular rate and rhythm, no murmurs or gallops Breast:Examined in sitting and supine position were symmetrical in appearance, no palpable masses or tenderness,  no skin retraction, no nipple inversion, no nipple discharge, no skin discoloration,  no axillary or supraclavicular lymphadenopathy Abdomen: no palpable masses or tenderness, no rebound or guarding Extremities: no edema or skin discoloration or tenderness  Pelvic:  Bartholin, Urethra, Skene Glands: Within normal limits             Vagina: No gross lesions or discharge  Cervix: Absent              Uterus: Absent   Adnexa  Without masses or tenderness  Anus and perineum  normal   Rectovaginal  normal sphincter tone without palpated masses or tenderness             Hemoccult  card provided      Assessment/Plan:  58 y.o. female for annual exam  with vaginal atrophy complaining of dyspareunia at times. We discussed Osphena for which ligatured formation was provided. She was reminded to do her monthly breast exam.  She is due for mammogram later this year. She was provided with Hemoccult cards to submit to the office for testing. She will schedule an ultrasound here in the office in the next couple weeks to look at her right adnexa in more detail. She was getting her bone density study for next month. We discussed importance of calcium and vitamin D for osteoporosis prevention. We discussed the detrimental effects of smoking and anti-smoking information was provided. Pap smear was not done in accordance to the new guidelines.   Note: This dictation was prepared with  Dragon/digital dictation along withSmart phrase technology. Any transcriptional errors that result from this process are unintentional.   Terrance Mass MD, 4:16 PM 01/19/2014

## 2014-01-19 NOTE — Patient Instructions (Addendum)
Shingles Vaccine What You Need to Know WHAT IS SHINGLES?  Shingles is a painful skin rash, often with blisters. It is also called Herpes Zoster or just Zoster.  A shingles rash usually appears on one side of the face or body and lasts from 2 to 4 weeks. Its main symptom is pain, which can be quite severe. Other symptoms of shingles can include fever, headache, chills, and upset stomach. Very rarely, a shingles infection can lead to pneumonia, hearing problems, blindness, brain inflammation (encephalitis), or death.  For about 1 person in 5, severe pain can continue even after the rash clears up. This is called post-herpetic neuralgia.  Shingles is caused by the Varicella Zoster virus. This is the same virus that causes chickenpox. Only someone who has had a case of chickenpox or rarely, has gotten chickenpox vaccine, can get shingles. The virus stays in your body. It can reappear many years later to cause a case of shingles.  You cannot catch shingles from another person with shingles. However, a person who has never had chickenpox (or chickenpox vaccine) could get chickenpox from someone with shingles. This is not very common.  Shingles is far more common in people 50 and older than in younger people. It is also more common in people whose immune systems are weakened because of a disease such as cancer or drugs such as steroids or chemotherapy.  At least 1 million people get shingles per year in the [place]. SHINGLES VACCINE  A vaccine for shingles was licensed in 2006. In clinical trials, the vaccine reduced the risk of shingles by 50%. It can also reduce the pain in people who still get shingles after being vaccinated.  A single dose of shingles vaccine is recommended for adults 60 years of age and older. SOME PEOPLE SHOULD NOT GET SHINGLES VACCINE OR SHOULD WAIT A person should not get shingles vaccine if he or she:  Has ever had a life-threatening allergic reaction to gelatin, the  antibiotic neomycin, or any other component of shingles vaccine. Tell your caregiver if you have any severe allergies.  Has a weakened immune system because of current:  AIDS or another disease that affects the immune system.  Treatment with drugs that affect the immune system, such as prolonged use of high-dose steroids.  Cancer treatment, such as radiation or chemotherapy.  Cancer affecting the bone marrow or lymphatic system, such as leukemia or lymphoma.  Is pregnant, or might be pregnant. Women should not become pregnant until at least 4 weeks after getting shingles vaccine. Someone with a minor illness, such as a cold, may be vaccinated. Anyone with a moderate or severe acute illness should usually wait until he or she recovers before getting the vaccine. This includes anyone with a temperature of 101.3 F (38 C) or higher. WHAT ARE THE RISKS FROM SHINGLES VACCINE?  A vaccine, like any medicine, could possibly cause serious problems, such as severe allergic reactions. However, the risk of a vaccine causing serious harm, or death, is extremely small.  No serious problems have been identified with shingles vaccine. Mild Problems  Redness, soreness, swelling, or itching at the site of the injection (about 1 person in 3).  Headache (about 1 person in 70). Like all vaccines, shingles vaccine is being closely monitored for unusual or severe problems. WHAT IF THERE IS A MODERATE OR SEVERE REACTION? What should I look for? Any unusual condition, such as a severe allergic reaction or a high fever. If a severe allergic reaction   occurred, it would be within a few minutes to an hour after the shot. Signs of a serious allergic reaction can include difficulty breathing, weakness, hoarseness or wheezing, a fast heartbeat, hives, dizziness, paleness, or swelling of the throat. What should I do?  Call your caregiver, or get the person to a caregiver right away.  Tell the caregiver what  happened, the date and time it happened, and when the vaccination was given.  Ask the caregiver to report the reaction by filing a Vaccine Adverse Event Reporting System (VAERS) form. Or, you can file this report through the VAERS web site at www.vaers.hhs.gov or by calling 1-800-822-7967. VAERS does not provide medical advice. HOW CAN I LEARN MORE?  Ask your caregiver. He or she can give you the vaccine package insert or suggest other sources of information.  Contact the Centers for Disease Control and Prevention (CDC):  Call 1-800-232-4636 (1-800-CDC-INFO).  Visit the CDC website at www.cdc.gov/vaccines CDC Shingles Vaccine VIS (08/16/08) Document Released: 08/25/2006 Document Revised: 01/20/2012 Document Reviewed: 02/17/2013 [employer] Patient Information 2014 [employer]. Smoking Cessation Quitting smoking is important to your health and has many advantages. However, it is not always easy to quit since nicotine is a very addictive drug. Often times, people try 3 times or more before being able to quit. This document explains the best ways for you to prepare to quit smoking. Quitting takes hard work and a lot of effort, but you can do it. ADVANTAGES OF QUITTING SMOKING  You will live longer, feel better, and live better.  Your body will feel the impact of quitting smoking almost immediately.  Within 20 minutes, blood pressure decreases. Your pulse returns to its normal level.  After 8 hours, carbon monoxide levels in the blood return to normal. Your oxygen level increases.  After 24 hours, the chance of having a heart attack starts to decrease. Your breath, hair, and body stop smelling like smoke.  After 48 hours, damaged nerve endings begin to recover. Your sense of taste and smell improve.  After 72 hours, the body is virtually free of nicotine. Your bronchial tubes relax and breathing becomes easier.  After 2 to 12 weeks, lungs can hold more air. Exercise becomes easier and  circulation improves.  The risk of having a heart attack, stroke, cancer, or lung disease is greatly reduced.  After 1 year, the risk of coronary heart disease is cut in half.  After 5 years, the risk of stroke falls to the same as a nonsmoker.  After 10 years, the risk of lung cancer is cut in half and the risk of other cancers decreases significantly.  After 15 years, the risk of coronary heart disease drops, usually to the level of a nonsmoker.  If you are pregnant, quitting smoking will improve your chances of having a healthy baby.  The people you live with, especially any children, will be healthier.  You will have extra money to spend on things other than cigarettes. QUESTIONS TO THINK ABOUT BEFORE ATTEMPTING TO QUIT You may want to talk about your answers with your caregiver.  Why do you want to quit?  If you tried to quit in the past, what helped and what did not?  What will be the most difficult situations for you after you quit? How will you plan to handle them?  Who can help you through the tough times? Your family? Friends? A caregiver?  What pleasures do you get from smoking? What ways can you still get pleasure if   you quit? Here are some questions to ask your caregiver:  How can you help me to be successful at quitting?  What medicine do you think would be best for me and how should I take it?  What should I do if I need more help?  What is smoking withdrawal like? How can I get information on withdrawal? GET READY  Set a quit date.  Change your environment by getting rid of all cigarettes, ashtrays, matches, and lighters in your home, car, or work. Do not let people smoke in your home.  Review your past attempts to quit. Think about what worked and what did not. GET SUPPORT AND ENCOURAGEMENT You have a better chance of being successful if you have help. You can get support in many ways.  Tell your family, friends, and co-workers that you are going to  quit and need their support. Ask them not to smoke around you.  Get individual, group, or telephone counseling and support. Programs are available at [employer] and health centers. Call your local health department for information about programs in your area.  Spiritual beliefs and practices may help some smokers quit.  Download a "quit meter" on your computer to keep track of quit statistics, such as how long you have gone without smoking, cigarettes not smoked, and money saved.  Get a self-help book about quitting smoking and staying off of tobacco. [place] yourself from urges to smoke. Talk to someone, go for a walk, or occupy your time with a task.  Change your normal routine. Take a different route to work. Drink tea instead of coffee. Eat breakfast in a different place.  Reduce your stress. Take a hot bath, exercise, or read a book.  Plan something enjoyable to do every day. Reward yourself for not smoking.  Explore interactive web-based programs that specialize in helping you quit. GET MEDICINE AND USE IT CORRECTLY Medicines can help you stop smoking and decrease the urge to smoke. Combining medicine with the above behavioral methods and support can greatly increase your chances of successfully quitting smoking.  Nicotine replacement therapy helps deliver nicotine to your body without the negative effects and risks of smoking. Nicotine replacement therapy includes nicotine gum, lozenges, inhalers, nasal sprays, and skin patches. Some may be available over-the-counter and others require a prescription.  Antidepressant medicine helps people abstain from smoking, but how this works is unknown. This medicine is available by prescription.  Nicotinic receptor partial agonist medicine simulates the effect of nicotine in your brain. This medicine is available by prescription. Ask your caregiver for advice about which medicines to use and how to use  them based on your health history. Your caregiver will tell you what side effects to look out for if you choose to be on a medicine or therapy. Carefully read the information on the package. Do not use any other product containing nicotine while using a nicotine replacement product.  RELAPSE OR DIFFICULT SITUATIONS Most relapses occur within the first 3 months after quitting. Do not be discouraged if you start smoking again. Remember, most people try several times before finally quitting. You may have symptoms of withdrawal because your body is used to nicotine. You may crave cigarettes, be irritable, feel very hungry, cough often, get headaches, or have difficulty concentrating. The withdrawal symptoms are only temporary. They are strongest when you first quit, but they will go away within 10 14 days. To reduce the chances of relapse, try to:  Avoid drinking alcohol. Drinking lowers your chances of successfully quitting.  Reduce the amount of caffeine you consume. Once you quit smoking, the amount of caffeine in your body increases and can give you symptoms, such as a rapid heartbeat, sweating, and anxiety.  Avoid smokers because they can make you want to smoke.  Do not let weight gain distract you. Many smokers will gain weight when they quit, usually less than 10 pounds. Eat a healthy diet and stay active. You can always lose the weight gained after you quit.  Find ways to improve your mood other than smoking. FOR MORE INFORMATION  www.smokefree.gov  Document Released: 10/22/2001 Document Revised: 04/28/2012 Document Reviewed: 02/06/2012 [facility] Patient Information 2014 [place], [place].

## 2014-01-31 ENCOUNTER — Encounter: Payer: Self-pay | Admitting: Obstetrics and Gynecology

## 2014-02-01 ENCOUNTER — Encounter: Payer: Self-pay | Admitting: Obstetrics and Gynecology

## 2014-02-16 ENCOUNTER — Other Ambulatory Visit: Payer: Medicare HMO

## 2014-02-16 ENCOUNTER — Ambulatory Visit: Payer: Medicare HMO | Admitting: Gynecology

## 2014-03-07 ENCOUNTER — Other Ambulatory Visit: Payer: Self-pay | Admitting: Gynecology

## 2014-03-07 DIAGNOSIS — Z13820 Encounter for screening for osteoporosis: Secondary | ICD-10-CM

## 2014-04-11 ENCOUNTER — Other Ambulatory Visit: Payer: Self-pay | Admitting: Gynecology

## 2014-04-11 ENCOUNTER — Ambulatory Visit (INDEPENDENT_AMBULATORY_CARE_PROVIDER_SITE_OTHER): Payer: Medicare HMO

## 2014-04-11 DIAGNOSIS — Z780 Asymptomatic menopausal state: Secondary | ICD-10-CM

## 2014-04-11 DIAGNOSIS — Z13820 Encounter for screening for osteoporosis: Secondary | ICD-10-CM

## 2014-09-06 ENCOUNTER — Telehealth: Payer: Self-pay | Admitting: *Deleted

## 2014-09-06 NOTE — Telephone Encounter (Signed)
Pt called requesting to start on low dose HRT menopausal symptoms have returned , I offered OV, pt asked me to send to you if Rx could be sent. Please advise

## 2014-09-06 NOTE — Telephone Encounter (Signed)
Needs consult

## 2014-09-06 NOTE — Telephone Encounter (Signed)
Pt informed with the below note. 

## 2014-09-12 ENCOUNTER — Encounter: Payer: Self-pay | Admitting: Gynecology

## 2014-10-19 ENCOUNTER — Other Ambulatory Visit: Payer: Self-pay

## 2014-10-19 DIAGNOSIS — Z1231 Encounter for screening mammogram for malignant neoplasm of breast: Secondary | ICD-10-CM

## 2014-11-09 ENCOUNTER — Ambulatory Visit: Payer: Self-pay

## 2014-11-21 ENCOUNTER — Ambulatory Visit: Payer: Self-pay

## 2014-11-30 ENCOUNTER — Ambulatory Visit: Payer: Self-pay

## 2014-12-21 ENCOUNTER — Ambulatory Visit: Payer: Self-pay

## 2015-01-04 ENCOUNTER — Ambulatory Visit: Payer: Self-pay

## 2015-01-20 ENCOUNTER — Ambulatory Visit: Payer: Self-pay

## 2015-01-23 ENCOUNTER — Encounter: Payer: Medicare HMO | Admitting: Gynecology

## 2015-02-14 ENCOUNTER — Ambulatory Visit: Payer: Self-pay

## 2015-02-22 ENCOUNTER — Encounter: Payer: Medicare HMO | Admitting: Gynecology

## 2015-03-15 ENCOUNTER — Ambulatory Visit: Payer: Self-pay

## 2015-08-07 ENCOUNTER — Encounter: Payer: Self-pay | Admitting: Cardiology

## 2015-08-08 ENCOUNTER — Encounter: Payer: Self-pay | Admitting: Cardiology

## 2015-09-21 ENCOUNTER — Other Ambulatory Visit: Payer: Self-pay

## 2015-09-21 DIAGNOSIS — Z1231 Encounter for screening mammogram for malignant neoplasm of breast: Secondary | ICD-10-CM

## 2015-10-23 ENCOUNTER — Encounter: Payer: Self-pay | Admitting: Gynecology

## 2015-10-23 ENCOUNTER — Ambulatory Visit (INDEPENDENT_AMBULATORY_CARE_PROVIDER_SITE_OTHER): Payer: PPO | Admitting: Gynecology

## 2015-10-23 VITALS — BP 124/70 | Ht 68.0 in

## 2015-10-23 DIAGNOSIS — Z780 Asymptomatic menopausal state: Secondary | ICD-10-CM

## 2015-10-23 DIAGNOSIS — N3941 Urge incontinence: Secondary | ICD-10-CM | POA: Diagnosis not present

## 2015-10-23 DIAGNOSIS — Z01419 Encounter for gynecological examination (general) (routine) without abnormal findings: Secondary | ICD-10-CM

## 2015-10-23 NOTE — Patient Instructions (Signed)
Urodynamic Testing  WHAT IS URODYNAMIC TESTING?  Urodynamic tests are done to determine how well your lower urinary tract is working. The lower urinary tract includes your bladder and the tube that empties your bladder (urethra).   When your kidneys filter your blood, urine is stored in your bladder until you feel the urge to pass urine (urinate). Urination requires coordination between the nerves and muscles of your bladder and urethra. When your lower urinary tract is working well, you should be able to:   · Start urinating when your bladder is full.  · Empty your bladder completely.  · Control the flow of your urine.  WHY DO I NEED URODYNAMIC TESTING?  You may need urodynamic testing if you:  · Are leaking urine (incontinence).  · Have problems starting or stopping your urine flow.  · Have frequent or painful urination.  · Have frequent urinary tract infections.  · Cannot empty your bladder completely.  · Have strong urges to pass urine (urgency).  · Have a weak flow of urine.  HOW IS URODYNAMIC TESTING DONE?  Urodynamic tests may be done separately or all during one testing visit. These tests may be done at your health care provider's office, a clinic, or a hospital. You may be given an antibiotic medicine before or after testing to prevent infection. Ask your health care provider if you should:  · Stop taking any of your regular medicines.  · Arrive for the test with a full bladder.  The urodynamic tests you may have done include:  Uroflowmetry  This test measures how much urine you pass and how long it takes to pass.  · You sit on a special toilet to urinate.  · The toilet measures the volume and the time of your urine flow.  · These measurements are sent to a computer that creates a graph of your urine flow.  Postvoid residual measurement  This test measures how much urine is left in your bladder after you urinate.  · It uses sound waves (ultrasound) to create an image of your bladder.  · The test can also be  done by inserting a thin, flexible tube (catheter) into your bladder after you urinate.  · Remaining urine is measured in milliliters (mL). If you have more than 100 mL left in your bladder after you urinate, your bladder is not emptying as it should.  Cystometric testing  This test uses a special bladder catheter that can measure pressure.   · A numbing medicine (local anesthetic) may be used.  · First, a normal catheter is used to empty your bladder completely.  · Then the measuring catheter is placed, and your bladder is filled with warm, germ-free (sterile) water.  · Pressure measurements will be taken:  ¨ As your bladder fills.  ¨ When you feel the need to urinate.  ¨ As your bladder is emptied.  · You may be asked to cough or bear down to check for leakage.  · In some cases, your bladder may be filled with a material that shows up on X-rays (contrast material) so that X-ray pictures can be taken during the test.  Electromyography  This test measures the electrical activity of the nerves and muscles of your bladder and the opening of your urethra.   · It tells how well your nerves are communicating with your muscles.  · Sticky patches are placed near your rectum and urethra to measure electrical activity.  WHAT ARE THE RISKS OF THIS TESTING?    Generally, these tests are safe. However, problems can occur and include:  · Discomfort.  · Frequent urge to urinate.  · Bleeding.  · Infection.  · An allergic reaction to contrast material, if contrast material is used.  WHAT HAPPENS AFTER THE TESTING?   · You should be able to go home right away and do your usual activities.  · You may be instructed to drink a tall glass of water every 30 minutes for the first 2 hours you are home.  · Taking a warm bath or using a warm compress may relieve any discomfort near your urethra.  Let your health care provider know if you have:  · Pain.  · Blood in your urine.  · Chills.  · Fever.  WHAT DO MY TEST RESULTS MEAN?  Discuss the  results of your urodynamic tests with your health care provider. Your health care provider will use the results of these and other tests, along with your signs and symptoms, to make a diagnosis. Some common causes for abnormal results from urodynamic tests include:  · Enlarged prostate in men.  · Overactive bladder.  · Urinary tract infection.  · Nervous system diseases.  · Spinal cord damage.     This information is not intended to replace advice given to you by your health care provider. Make sure you discuss any questions you have with your health care provider.     Document Released: 08/25/2007 Document Revised: 11/18/2014 Document Reviewed: 02/07/2014  Elsevier Interactive Patient Education ©2016 [employer]

## 2015-10-23 NOTE — Progress Notes (Signed)
[name] [birth year] [id number]   History:    59 y.o.  for annual gyn exam who states that sometimes she has to get up several times a night to urinate if she doesn't get to the bathroom time she will leak but this only happened at night. She does smoke half a pack cigarette per day. She had a colonoscopy in 2011 benign colon polyps was removed she scheduled for colonoscopy next month. She is currently working with the orthopedic surgeon who is injecting her and her spine as a result of her sciatica. She has had prior history of laminectomy. Her PCP has been doing her blood work. 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 Patient several years ago had been on HRT for 3 months and discontinued it. Patient has been smoking for many years and states that she smokes three fourths of a pack daily. 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 Patient's last bone density study was normal in 2015. Patient's vaccines are up-to-date.  Past medical history,surgical history, family history and social history were all reviewed and documented in the EPIC chart.  Gynecologic History No LMP recorded. Patient has had a hysterectomy. Contraception: post menopausal status Last Pap: 2011. Results were: normal Last mammogram: 2014. Results were: normal  Obstetric History OB History  Gravida Para Term Preterm AB SAB TAB Ectopic Multiple Living  9 5   4 4    4     # Outcome Date GA Lbr Len/2nd Weight Sex Delivery Anes PTL Lv  9 SAB           8 SAB           7 SAB           6 SAB           5 Para           4 Para           3 Para           2 Para           1 Para                ROS: A ROS was performed and pertinent positives and negatives are  included in the history.  GENERAL: No fevers or chills. HEENT: No change in vision, no earache, sore throat or sinus congestion. NECK: No pain or stiffness. CARDIOVASCULAR: No chest pain or pressure. No palpitations. PULMONARY: No shortness of breath, cough or wheeze. GASTROINTESTINAL: No abdominal pain, nausea, vomiting or diarrhea, melena or bright red blood per rectum. GENITOURINARY: No urinary frequency, urgency, hesitancy or dysuria. MUSCULOSKELETAL: No joint or muscle pain, no back pain, no recent trauma. DERMATOLOGIC: No rash, no itching, no lesions. ENDOCRINE: No polyuria, polydipsia, no heat or cold intolerance. No recent change in weight. HEMATOLOGICAL: No anemia or easy bruising or bleeding. NEUROLOGIC: No headache, seizures, numbness, tingling or weakness. PSYCHIATRIC: No depression, no loss of interest in normal activity or change in sleep pattern.     Exam: chaperone present  BP 124/70 mmHg  Ht 5\' 8"  (1.727 m)  There is no weight on file to calculate BMI.  General appearance : Well developed well nourished female. No acute distress HEENT: Eyes: no retinal hemorrhage or  exudates,  Neck supple, trachea midline, no carotid bruits, no thyroidmegaly Lungs: Clear to auscultation, no rhonchi or wheezes, or rib retractions  Heart: Regular rate and rhythm, no murmurs or gallops Breast:Examined in sitting and supine position were symmetrical in appearance, no palpable masses or tenderness,  no skin retraction, no nipple inversion, no nipple discharge, no skin discoloration, no axillary or supraclavicular lymphadenopathy Abdomen: no palpable masses or tenderness, no rebound or guarding Extremities: no edema or skin discoloration or tenderness  Pelvic:  Bartholin, Urethra, Skene Glands: Within normal limits             Vagina: No gross lesions or discharge  Cervix: Absent  Uterus  absent  Adnexa  Without masses or tenderness  Anus and perineum  normal   Rectovaginal  normal sphincter  tone without palpated masses or tenderness             Hemoccult colonoscopy scheduled for next month     Assessment/Plan:  59 y.o. female for annual exam with nocturia and urgency incontinence. We will schedule for her to have a urodynamic evaluation here in the office in the next few weeks. Pap smear not indicated. We discussed importance of monthly self breast examination. We discussed importance of calcium vitamin D and regular exercise for osteoporosis prevention. We discussed about the detrimental effects of smoking. Pap smear not indicated.   Terrance Mass MD, 4:14 PM 10/23/2015

## 2015-10-25 ENCOUNTER — Ambulatory Visit
Admission: RE | Admit: 2015-10-25 | Discharge: 2015-10-25 | Disposition: A | Discharge status: Home / Self Care | Payer: PPO | Source: Ambulatory Visit

## 2015-10-25 DIAGNOSIS — Z1231 Encounter for screening mammogram for malignant neoplasm of breast: Secondary | ICD-10-CM

## 2015-11-14 DIAGNOSIS — D126 Benign neoplasm of colon, unspecified: Secondary | ICD-10-CM | POA: Diagnosis not present

## 2015-11-14 DIAGNOSIS — K5730 Diverticulosis of large intestine without perforation or abscess without bleeding: Secondary | ICD-10-CM | POA: Diagnosis not present

## 2015-11-14 DIAGNOSIS — Z86010 Personal history of colonic polyps: Secondary | ICD-10-CM | POA: Diagnosis not present

## 2015-11-14 DIAGNOSIS — K621 Rectal polyp: Secondary | ICD-10-CM | POA: Diagnosis not present

## 2015-11-14 DIAGNOSIS — D125 Benign neoplasm of sigmoid colon: Secondary | ICD-10-CM | POA: Diagnosis not present

## 2015-11-14 DIAGNOSIS — D123 Benign neoplasm of transverse colon: Secondary | ICD-10-CM | POA: Diagnosis not present

## 2015-11-15 ENCOUNTER — Telehealth: Payer: Self-pay | Admitting: Gynecology

## 2015-11-15 NOTE — Telephone Encounter (Signed)
On Call Note:  Began UTI symptoms over the last 1-2 hours with frequency and urgency.  No fevers, low back pain, dysuria.  History of same before and familiar with the symptoms.  Cipro 250 mg Bid X 5 days.  Ov if symptoms persist or worsen

## 2015-12-20 DIAGNOSIS — M542 Cervicalgia: Secondary | ICD-10-CM | POA: Diagnosis not present

## 2015-12-21 ENCOUNTER — Other Ambulatory Visit: Payer: Self-pay | Admitting: Orthopaedic Surgery

## 2015-12-21 DIAGNOSIS — M542 Cervicalgia: Secondary | ICD-10-CM

## 2015-12-28 ENCOUNTER — Other Ambulatory Visit: Payer: Self-pay

## 2016-01-09 ENCOUNTER — Ambulatory Visit
Admission: RE | Admit: 2016-01-09 | Discharge: 2016-01-09 | Disposition: A | Discharge status: Home / Self Care | Payer: PPO | Source: Ambulatory Visit | Attending: Orthopaedic Surgery | Admitting: Orthopaedic Surgery

## 2016-01-09 DIAGNOSIS — M50221 Other cervical disc displacement at C4-C5 level: Secondary | ICD-10-CM | POA: Diagnosis not present

## 2016-01-09 DIAGNOSIS — M542 Cervicalgia: Secondary | ICD-10-CM

## 2016-01-18 DIAGNOSIS — F3181 Bipolar II disorder: Secondary | ICD-10-CM | POA: Diagnosis not present

## 2016-01-22 DIAGNOSIS — M542 Cervicalgia: Secondary | ICD-10-CM | POA: Diagnosis not present

## 2016-01-29 DIAGNOSIS — M542 Cervicalgia: Secondary | ICD-10-CM | POA: Diagnosis not present

## 2016-03-13 DIAGNOSIS — Z711 Person with feared health complaint in whom no diagnosis is made: Secondary | ICD-10-CM | POA: Diagnosis not present

## 2016-03-25 DIAGNOSIS — M5416 Radiculopathy, lumbar region: Secondary | ICD-10-CM | POA: Diagnosis not present

## 2016-03-27 DIAGNOSIS — L400 Psoriasis vulgaris: Secondary | ICD-10-CM | POA: Diagnosis not present

## 2016-03-27 DIAGNOSIS — L821 Other seborrheic keratosis: Secondary | ICD-10-CM | POA: Diagnosis not present

## 2016-03-27 DIAGNOSIS — L720 Epidermal cyst: Secondary | ICD-10-CM | POA: Diagnosis not present

## 2016-05-21 DIAGNOSIS — F3181 Bipolar II disorder: Secondary | ICD-10-CM | POA: Diagnosis not present

## 2016-05-28 DIAGNOSIS — M5416 Radiculopathy, lumbar region: Secondary | ICD-10-CM | POA: Diagnosis not present

## 2016-05-28 DIAGNOSIS — M545 Low back pain: Secondary | ICD-10-CM | POA: Diagnosis not present

## 2016-05-28 DIAGNOSIS — Z6826 Body mass index (BMI) 26.0-26.9, adult: Secondary | ICD-10-CM | POA: Diagnosis not present

## 2016-06-10 DIAGNOSIS — M545 Low back pain: Secondary | ICD-10-CM | POA: Diagnosis not present

## 2016-06-25 DIAGNOSIS — M545 Low back pain: Secondary | ICD-10-CM | POA: Diagnosis not present

## 2016-06-27 DIAGNOSIS — M545 Low back pain: Secondary | ICD-10-CM | POA: Diagnosis not present

## 2016-07-01 DIAGNOSIS — M545 Low back pain: Secondary | ICD-10-CM | POA: Diagnosis not present

## 2016-07-08 DIAGNOSIS — M545 Low back pain: Secondary | ICD-10-CM | POA: Diagnosis not present

## 2016-07-11 DIAGNOSIS — M545 Low back pain: Secondary | ICD-10-CM | POA: Diagnosis not present

## 2016-08-08 DIAGNOSIS — M545 Low back pain: Secondary | ICD-10-CM | POA: Diagnosis not present

## 2016-08-15 ENCOUNTER — Ambulatory Visit (INDEPENDENT_AMBULATORY_CARE_PROVIDER_SITE_OTHER): Payer: PPO | Admitting: Gynecology

## 2016-08-15 ENCOUNTER — Encounter: Payer: Self-pay | Admitting: Gynecology

## 2016-08-15 VITALS — BP 124/78 | Ht 68.0 in | Wt 160.0 lb

## 2016-08-15 DIAGNOSIS — R300 Dysuria: Secondary | ICD-10-CM | POA: Diagnosis not present

## 2016-08-15 DIAGNOSIS — N952 Postmenopausal atrophic vaginitis: Secondary | ICD-10-CM

## 2016-08-15 DIAGNOSIS — M545 Low back pain: Secondary | ICD-10-CM | POA: Diagnosis not present

## 2016-08-15 DIAGNOSIS — N9089 Other specified noninflammatory disorders of vulva and perineum: Secondary | ICD-10-CM | POA: Diagnosis not present

## 2016-08-15 DIAGNOSIS — Z79890 Hormone replacement therapy (postmenopausal): Secondary | ICD-10-CM

## 2016-08-15 LAB — URINALYSIS W MICROSCOPIC + REFLEX CULTURE
Bacteria, UA: NONE SEEN [HPF]
Bilirubin Urine: NEGATIVE
CASTS: NONE SEEN [LPF]
CRYSTALS: NONE SEEN [HPF]
Glucose, UA: NEGATIVE
Ketones, ur: NEGATIVE
Leukocytes, UA: NEGATIVE
NITRITE: NEGATIVE
PROTEIN: NEGATIVE
SPECIFIC GRAVITY, URINE: 1.015 (ref 1.001–1.035)
Yeast: NONE SEEN [HPF]
pH: 5.5 (ref 5.0–8.0)

## 2016-08-15 LAB — WET PREP FOR TRICH, YEAST, CLUE
CLUE CELLS WET PREP: NONE SEEN
TRICH WET PREP: NONE SEEN
YEAST WET PREP: NONE SEEN

## 2016-08-15 MED ORDER — ESTRADIOL 0.1 MG/GM VA CREA: 1.0000 | TOPICAL_CREAM | VAGINAL | 8 refills | Status: DC

## 2016-08-15 MED ORDER — METRONIDAZOLE 500 MG PO TABS: 500.0000 mg | ORAL_TABLET | Freq: Two times a day (BID) | ORAL | 0 refills | Status: DC

## 2016-08-15 NOTE — Progress Notes (Signed)
   Patient is a 59 year old that presented to the office today stating that she was having some vulvar irritation and was concerned whether she had a herpes outbreak. She gets one every 3 or 4 years. And she thought she had a discharge recently and treated herself with over-the-counter Monistat. She came to be checked today but she is mainly complaining about vaginal dryness and irritation. She is on no hormone replacement therapy. She is due for annual exam this December. Patient with prior history of any abdominal hysterectomy and left salpingo-oophorectomy in the past.  Exam: Bartholin urethra Skene glands with atrophic changes Vagina atrophic changes Vaginal cuff no lesions seen  Wet prep essentially negative few white blood cell few bacteria  Urinalysis 0-5 WBC, 0-2 RBC and no bacteria culture pending  Assessment/plan: Patient with atrophic vaginitis. I have recommended Estrace vaginal cream twice to apply intravaginally and external genitalia twice a week. Risk benefits and pros and cons were discussed. Patient otherwise scheduled to return back to the office in December this year for annual exam or when necessary.

## 2016-08-15 NOTE — Patient Instructions (Signed)
Estradiol vaginal cream What is this medicine? ESTRADIOL (es tra DYE ole) contains the female hormone estrogen. It is used for symptoms of menopause, like vaginal dryness and irritation. This medicine may be used for other purposes; ask your health care provider or pharmacist if you have questions. What should I tell my health care provider before I take this medicine? They need to know if you have any of these conditions: -abnormal vaginal bleeding -blood vessel disease or blood clots -breast, cervical, endometrial, ovarian, liver, or uterine cancer -dementia -diabetes -gallbladder disease -heart disease or recent heart attack -high blood pressure -high cholesterol -high levels of calcium in the blood -hysterectomy -kidney disease -liver disease -migraine headaches -protein C deficiency -protein S deficiency -stroke -systemic lupus erythematosus (SLE) -tobacco smoker -an unusual or allergic reaction to estrogens, other hormones, soy, other medicines, foods, dyes, or preservatives -pregnant or trying to get pregnant -breast-feeding How should I use this medicine? This medicine is for use in the vagina only. Do not take by mouth. Follow the directions on the prescription label. Read package directions carefully before using. Use the special applicator supplied with the cream. Wash hands before and after use. Fill the applicator with the prescribed amount of cream. Lie on your back, part and bend your knees. Insert the applicator into the vagina and push the plunger to expel the cream into the vagina. Wash the applicator with warm soapy water and rinse well. Use exactly as directed for the complete length of time prescribed. Do not stop using except on the advice of your doctor or health care professional. A patient package insert for the product will be given with each prescription and refill. Read this sheet carefully each time. The sheet may change frequently. Talk to your  pediatrician regarding the use of this medicine in children. This medicine is not approved for use in children. Overdosage: If you think you have taken too much of this medicine contact a poison control center or emergency room at once. NOTE: This medicine is only for you. Do not share this medicine with others. What if I miss a dose? If you miss a dose, use it as soon as you can. If it is almost time for your next dose, use only that dose. Do not use double or extra doses. What may interact with this medicine? Do not take this medicine with any of the following medications: -aromatase inhibitors like aminoglutethimide, anastrozole, exemestane, letrozole, testolactone This medicine may also interact with the following medications: -barbiturates used for inducing sleep or treating seizures -carbamazepine -grapefruit juice -medicines for fungal infections like ketoconazole and itraconazole -raloxifene -rifabutin -rifampin -rifapentine -ritonavir -some antibiotics used to treat infections -St. John's Wort -tamoxifen -warfarin This list may not describe all possible interactions. Give your health care provider a list of all the medicines, herbs, non-prescription drugs, or dietary supplements you use. Also tell them if you smoke, drink alcohol, or use illegal drugs. Some items may interact with your medicine. What should I watch for while using this medicine? Visit your health care professional for regular checks on your progress. You will need a regular breast and pelvic exam. You should also discuss the need for regular mammograms with your health care professional, and follow his or her guidelines. This medicine can make your body retain fluid, making your fingers, hands, or ankles swell. Your blood pressure can go up. Contact your doctor or health care professional if you feel you are retaining fluid. If you have any reason to think   you are pregnant, stop taking this medicine at once and  contact your doctor or health care professional. Tobacco smoking increases the risk of getting a blood clot or having a stroke, especially if you are more than 61 years old. You are strongly advised not to smoke. If you wear contact lenses and notice visual changes, or if the lenses begin to feel uncomfortable, consult your eye care specialist. If you are going to have elective surgery, you may need to stop taking this medicine beforehand. Consult your health care professional for advice prior to scheduling the surgery. What side effects may I notice from receiving this medicine? Side effects that you should report to your doctor or health care professional as soon as possible: -allergic reactions like skin rash, itching or hives, swelling of the face, lips, or tongue -breast tissue changes or discharge -changes in vision -chest pain -confusion, trouble speaking or understanding -dark urine -general ill feeling or flu-like symptoms -light-colored stools -nausea, vomiting -pain, swelling, warmth in the leg -right upper belly pain -severe headaches -shortness of breath -sudden numbness or weakness of the face, arm or leg -trouble walking, dizziness, loss of balance or coordination -unusual vaginal bleeding -yellowing of the eyes or skin Side effects that usually do not require medical attention (report to your doctor or health care professional if they continue or are bothersome): -hair loss -increased hunger or thirst -increased urination -symptoms of vaginal infection like itching, irritation or unusual discharge -unusually weak or tired This list may not describe all possible side effects. Call your doctor for medical advice about side effects. You may report side effects to FDA at 1-800-FDA-1088. Where should I keep my medicine? Keep out of the reach of children. Store at room temperature between 15 and 30 degrees C (59 and 86 degrees F). Protect from temperatures above 40 degrees C  (104 degrees C). Do not freeze. Throw away any unused medicine after the expiration date. NOTE: This sheet is a summary. It may not cover all possible information. If you have questions about this medicine, talk to your doctor, pharmacist, or health care provider.    2016, Elsevier/Gold Standard. (2011-01-30 09:18:12)  

## 2016-08-18 LAB — URINE CULTURE

## 2016-08-19 ENCOUNTER — Other Ambulatory Visit: Payer: Self-pay | Admitting: Gynecology

## 2016-08-19 DIAGNOSIS — H9190 Unspecified hearing loss, unspecified ear: Secondary | ICD-10-CM | POA: Insufficient documentation

## 2016-08-19 MED ORDER — CEFUROXIME AXETIL 250 MG PO TABS: 250.0000 mg | ORAL_TABLET | Freq: Two times a day (BID) | ORAL | 0 refills | Status: DC

## 2016-08-20 DIAGNOSIS — M545 Low back pain: Secondary | ICD-10-CM | POA: Diagnosis not present

## 2016-08-27 DIAGNOSIS — E7800 Pure hypercholesterolemia, unspecified: Secondary | ICD-10-CM | POA: Diagnosis not present

## 2016-08-27 DIAGNOSIS — F319 Bipolar disorder, unspecified: Secondary | ICD-10-CM | POA: Diagnosis not present

## 2016-08-27 DIAGNOSIS — M545 Low back pain: Secondary | ICD-10-CM | POA: Diagnosis not present

## 2016-08-27 DIAGNOSIS — R209 Unspecified disturbances of skin sensation: Secondary | ICD-10-CM | POA: Diagnosis not present

## 2016-08-27 DIAGNOSIS — Z1159 Encounter for screening for other viral diseases: Secondary | ICD-10-CM | POA: Diagnosis not present

## 2016-09-02 DIAGNOSIS — M7702 Medial epicondylitis, left elbow: Secondary | ICD-10-CM | POA: Diagnosis not present

## 2016-09-09 DIAGNOSIS — M545 Low back pain: Secondary | ICD-10-CM | POA: Diagnosis not present

## 2016-09-12 DIAGNOSIS — Z0000 Encounter for general adult medical examination without abnormal findings: Secondary | ICD-10-CM | POA: Diagnosis not present

## 2016-09-12 DIAGNOSIS — Z8619 Personal history of other infectious and parasitic diseases: Secondary | ICD-10-CM | POA: Diagnosis not present

## 2016-09-12 DIAGNOSIS — L409 Psoriasis, unspecified: Secondary | ICD-10-CM | POA: Diagnosis not present

## 2016-09-12 DIAGNOSIS — E7800 Pure hypercholesterolemia, unspecified: Secondary | ICD-10-CM | POA: Diagnosis not present

## 2016-09-12 DIAGNOSIS — B009 Herpesviral infection, unspecified: Secondary | ICD-10-CM | POA: Diagnosis not present

## 2016-09-12 DIAGNOSIS — F419 Anxiety disorder, unspecified: Secondary | ICD-10-CM | POA: Diagnosis not present

## 2016-09-12 DIAGNOSIS — M549 Dorsalgia, unspecified: Secondary | ICD-10-CM | POA: Diagnosis not present

## 2016-09-12 DIAGNOSIS — K5904 Chronic idiopathic constipation: Secondary | ICD-10-CM | POA: Diagnosis not present

## 2016-09-12 DIAGNOSIS — L401 Generalized pustular psoriasis: Secondary | ICD-10-CM | POA: Diagnosis not present

## 2016-09-12 DIAGNOSIS — F3176 Bipolar disorder, in full remission, most recent episode depressed: Secondary | ICD-10-CM | POA: Diagnosis not present

## 2016-09-12 DIAGNOSIS — N393 Stress incontinence (female) (male): Secondary | ICD-10-CM | POA: Diagnosis not present

## 2016-09-19 DIAGNOSIS — F3181 Bipolar II disorder: Secondary | ICD-10-CM | POA: Diagnosis not present

## 2016-10-05 DIAGNOSIS — J042 Acute laryngotracheitis: Secondary | ICD-10-CM | POA: Diagnosis not present

## 2016-10-14 DIAGNOSIS — M545 Low back pain: Secondary | ICD-10-CM | POA: Diagnosis not present

## 2016-10-14 DIAGNOSIS — G8929 Other chronic pain: Secondary | ICD-10-CM | POA: Diagnosis not present

## 2016-10-21 DIAGNOSIS — M545 Low back pain: Secondary | ICD-10-CM | POA: Diagnosis not present

## 2016-10-24 DIAGNOSIS — M545 Low back pain: Secondary | ICD-10-CM | POA: Diagnosis not present

## 2016-10-31 DIAGNOSIS — M542 Cervicalgia: Secondary | ICD-10-CM | POA: Diagnosis not present

## 2016-11-12 DIAGNOSIS — M542 Cervicalgia: Secondary | ICD-10-CM | POA: Diagnosis not present

## 2016-11-18 DIAGNOSIS — M7702 Medial epicondylitis, left elbow: Secondary | ICD-10-CM | POA: Diagnosis not present

## 2016-11-19 DIAGNOSIS — M542 Cervicalgia: Secondary | ICD-10-CM | POA: Diagnosis not present

## 2016-12-12 DIAGNOSIS — F3181 Bipolar II disorder: Secondary | ICD-10-CM | POA: Diagnosis not present

## 2016-12-19 ENCOUNTER — Other Ambulatory Visit: Payer: Self-pay | Admitting: Gynecology

## 2016-12-19 DIAGNOSIS — Z1231 Encounter for screening mammogram for malignant neoplasm of breast: Secondary | ICD-10-CM

## 2016-12-25 DIAGNOSIS — R000 Tachycardia, unspecified: Secondary | ICD-10-CM | POA: Diagnosis not present

## 2016-12-25 DIAGNOSIS — M545 Low back pain: Secondary | ICD-10-CM | POA: Diagnosis not present

## 2016-12-25 DIAGNOSIS — G8929 Other chronic pain: Secondary | ICD-10-CM | POA: Diagnosis not present

## 2016-12-25 DIAGNOSIS — M549 Dorsalgia, unspecified: Secondary | ICD-10-CM | POA: Diagnosis not present

## 2017-01-17 ENCOUNTER — Ambulatory Visit: Payer: Self-pay

## 2017-02-26 DIAGNOSIS — M7702 Medial epicondylitis, left elbow: Secondary | ICD-10-CM | POA: Diagnosis not present

## 2017-03-13 DIAGNOSIS — M7702 Medial epicondylitis, left elbow: Secondary | ICD-10-CM | POA: Diagnosis not present

## 2017-03-19 DIAGNOSIS — F3181 Bipolar II disorder: Secondary | ICD-10-CM | POA: Diagnosis not present

## 2017-03-21 DIAGNOSIS — Z9889 Other specified postprocedural states: Secondary | ICD-10-CM | POA: Diagnosis not present

## 2017-03-25 DIAGNOSIS — M25522 Pain in left elbow: Secondary | ICD-10-CM | POA: Diagnosis not present

## 2017-03-25 DIAGNOSIS — M7702 Medial epicondylitis, left elbow: Secondary | ICD-10-CM | POA: Diagnosis not present

## 2017-03-26 ENCOUNTER — Encounter: Payer: Self-pay | Admitting: Gynecology

## 2017-04-02 DIAGNOSIS — M7702 Medial epicondylitis, left elbow: Secondary | ICD-10-CM | POA: Diagnosis not present

## 2017-04-02 DIAGNOSIS — M25522 Pain in left elbow: Secondary | ICD-10-CM | POA: Diagnosis not present

## 2017-04-09 DIAGNOSIS — M7702 Medial epicondylitis, left elbow: Secondary | ICD-10-CM | POA: Diagnosis not present

## 2017-04-09 DIAGNOSIS — M25522 Pain in left elbow: Secondary | ICD-10-CM | POA: Diagnosis not present

## 2017-04-11 DIAGNOSIS — Z9889 Other specified postprocedural states: Secondary | ICD-10-CM | POA: Diagnosis not present

## 2017-04-11 DIAGNOSIS — M25522 Pain in left elbow: Secondary | ICD-10-CM | POA: Diagnosis not present

## 2017-04-11 DIAGNOSIS — M7702 Medial epicondylitis, left elbow: Secondary | ICD-10-CM | POA: Diagnosis not present

## 2017-04-14 DIAGNOSIS — M7702 Medial epicondylitis, left elbow: Secondary | ICD-10-CM | POA: Diagnosis not present

## 2017-04-14 DIAGNOSIS — M25522 Pain in left elbow: Secondary | ICD-10-CM | POA: Diagnosis not present

## 2017-04-16 DIAGNOSIS — M7702 Medial epicondylitis, left elbow: Secondary | ICD-10-CM | POA: Diagnosis not present

## 2017-04-16 DIAGNOSIS — M25522 Pain in left elbow: Secondary | ICD-10-CM | POA: Diagnosis not present

## 2017-04-24 DIAGNOSIS — M7702 Medial epicondylitis, left elbow: Secondary | ICD-10-CM | POA: Diagnosis not present

## 2017-04-24 DIAGNOSIS — M25522 Pain in left elbow: Secondary | ICD-10-CM | POA: Diagnosis not present

## 2017-04-28 DIAGNOSIS — M545 Low back pain: Secondary | ICD-10-CM | POA: Diagnosis not present

## 2017-04-28 DIAGNOSIS — T07XXXA Unspecified multiple injuries, initial encounter: Secondary | ICD-10-CM | POA: Diagnosis not present

## 2017-04-28 DIAGNOSIS — G8929 Other chronic pain: Secondary | ICD-10-CM | POA: Diagnosis not present

## 2017-04-30 DIAGNOSIS — L408 Other psoriasis: Secondary | ICD-10-CM | POA: Diagnosis not present

## 2017-04-30 DIAGNOSIS — Z87898 Personal history of other specified conditions: Secondary | ICD-10-CM | POA: Diagnosis not present

## 2017-04-30 DIAGNOSIS — M79604 Pain in right leg: Secondary | ICD-10-CM | POA: Diagnosis not present

## 2017-04-30 DIAGNOSIS — Z8679 Personal history of other diseases of the circulatory system: Secondary | ICD-10-CM | POA: Diagnosis not present

## 2017-05-20 DIAGNOSIS — M7702 Medial epicondylitis, left elbow: Secondary | ICD-10-CM | POA: Diagnosis not present

## 2017-05-20 DIAGNOSIS — M25522 Pain in left elbow: Secondary | ICD-10-CM | POA: Diagnosis not present

## 2017-05-21 DIAGNOSIS — M79604 Pain in right leg: Secondary | ICD-10-CM | POA: Diagnosis not present

## 2017-07-29 DIAGNOSIS — H02413 Mechanical ptosis of bilateral eyelids: Secondary | ICD-10-CM | POA: Diagnosis not present

## 2017-07-30 ENCOUNTER — Encounter: Payer: PPO | Admitting: Gynecology

## 2017-08-13 DIAGNOSIS — H02413 Mechanical ptosis of bilateral eyelids: Secondary | ICD-10-CM | POA: Diagnosis not present

## 2017-08-14 DIAGNOSIS — M5030 Other cervical disc degeneration, unspecified cervical region: Secondary | ICD-10-CM | POA: Diagnosis not present

## 2017-08-14 DIAGNOSIS — M5412 Radiculopathy, cervical region: Secondary | ICD-10-CM | POA: Diagnosis not present

## 2017-09-05 ENCOUNTER — Other Ambulatory Visit: Payer: Self-pay | Admitting: Family Medicine

## 2017-09-05 DIAGNOSIS — M5412 Radiculopathy, cervical region: Secondary | ICD-10-CM

## 2017-09-08 ENCOUNTER — Encounter: Payer: PPO | Admitting: Gynecology

## 2017-09-17 ENCOUNTER — Other Ambulatory Visit: Payer: Self-pay

## 2017-09-22 DIAGNOSIS — H02413 Mechanical ptosis of bilateral eyelids: Secondary | ICD-10-CM | POA: Diagnosis not present

## 2017-09-22 DIAGNOSIS — H02834 Dermatochalasis of left upper eyelid: Secondary | ICD-10-CM | POA: Diagnosis not present

## 2017-09-22 DIAGNOSIS — H02831 Dermatochalasis of right upper eyelid: Secondary | ICD-10-CM | POA: Diagnosis not present

## 2017-09-22 DIAGNOSIS — H02412 Mechanical ptosis of left eyelid: Secondary | ICD-10-CM | POA: Diagnosis not present

## 2017-09-22 DIAGNOSIS — H02411 Mechanical ptosis of right eyelid: Secondary | ICD-10-CM | POA: Diagnosis not present

## 2017-09-22 DIAGNOSIS — H02403 Unspecified ptosis of bilateral eyelids: Secondary | ICD-10-CM | POA: Diagnosis not present

## 2017-09-30 ENCOUNTER — Ambulatory Visit: Payer: Self-pay

## 2017-10-15 ENCOUNTER — Other Ambulatory Visit: Payer: Self-pay | Admitting: Gynecology

## 2017-10-15 ENCOUNTER — Ambulatory Visit
Admission: RE | Admit: 2017-10-15 | Discharge: 2017-10-15 | Disposition: A | Discharge status: Home / Self Care | Payer: PPO | Source: Ambulatory Visit | Attending: Gynecology | Admitting: Gynecology

## 2017-10-15 DIAGNOSIS — Z1231 Encounter for screening mammogram for malignant neoplasm of breast: Secondary | ICD-10-CM

## 2017-11-13 DIAGNOSIS — F3181 Bipolar II disorder: Secondary | ICD-10-CM | POA: Diagnosis not present

## 2017-11-14 DIAGNOSIS — J80 Acute respiratory distress syndrome: Secondary | ICD-10-CM | POA: Diagnosis not present

## 2017-11-14 DIAGNOSIS — R4182 Altered mental status, unspecified: Secondary | ICD-10-CM | POA: Diagnosis not present

## 2017-12-17 IMAGING — MG 2D DIGITAL SCREENING BILATERAL MAMMOGRAM WITH CAD AND ADJUNCT TO
8 of 12 series · 8 of 28 positions shown · non-contrast
Comparison: Previous exam(s).

CLINICAL DATA: Screening.

EXAM:
2D DIGITAL SCREENING BILATERAL MAMMOGRAM WITH CAD AND ADJUNCT TOMO

[L CC]
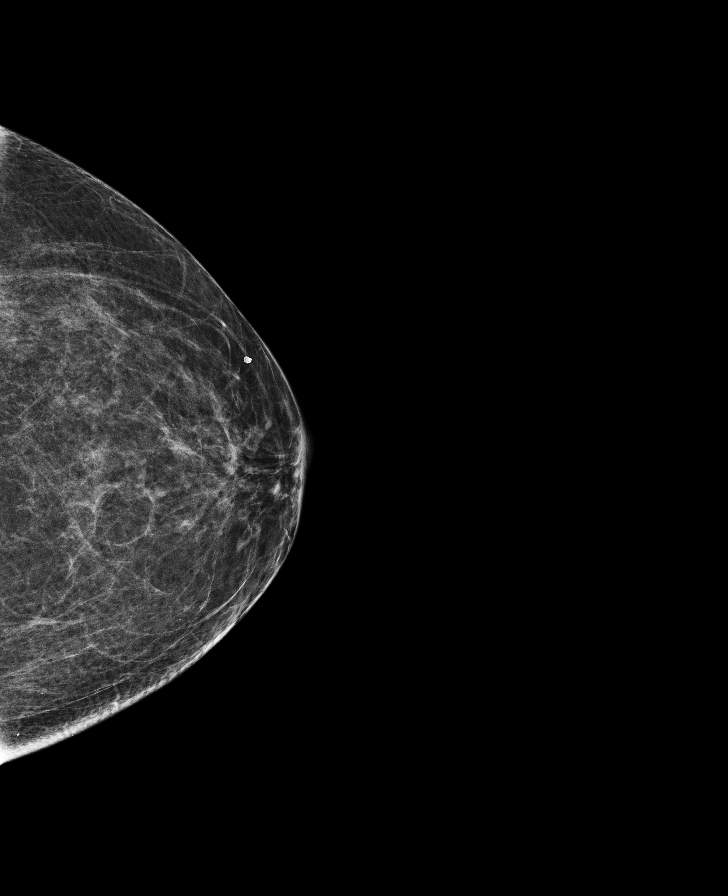

[L CC synth-2D]
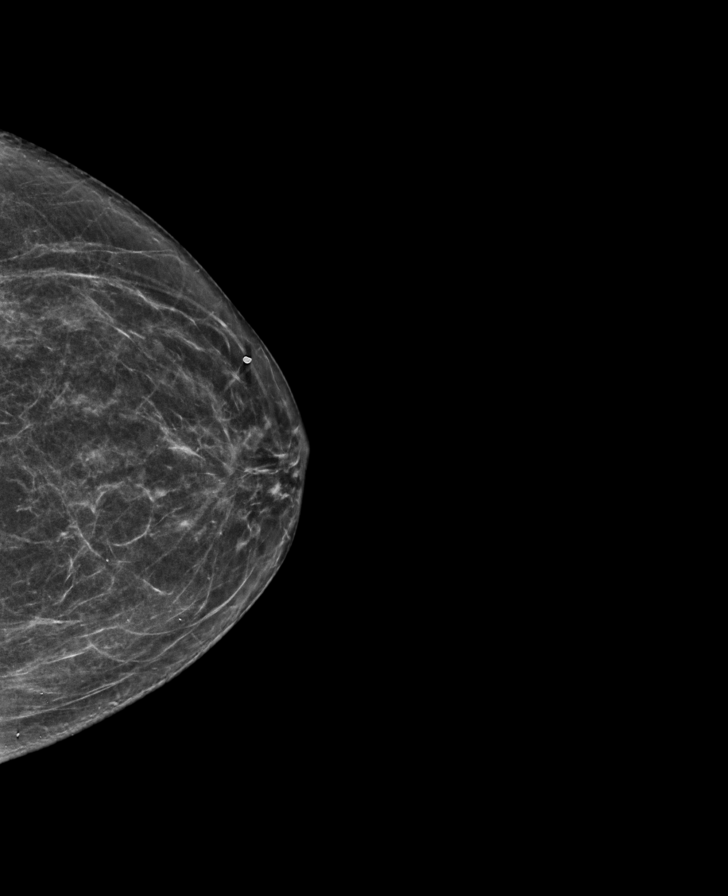

[R MLO]
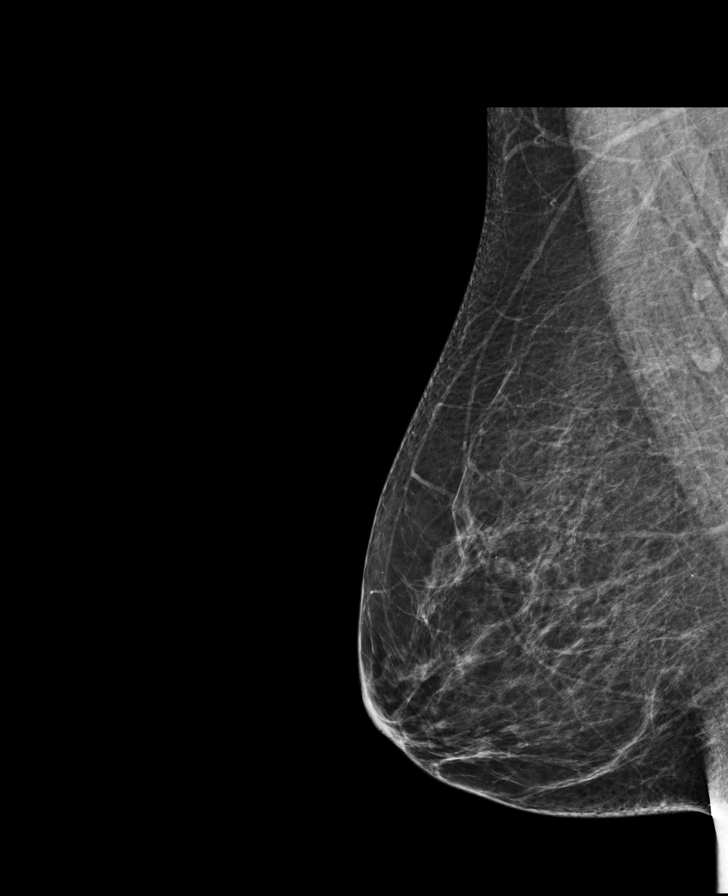

[L MLO synth-2D]
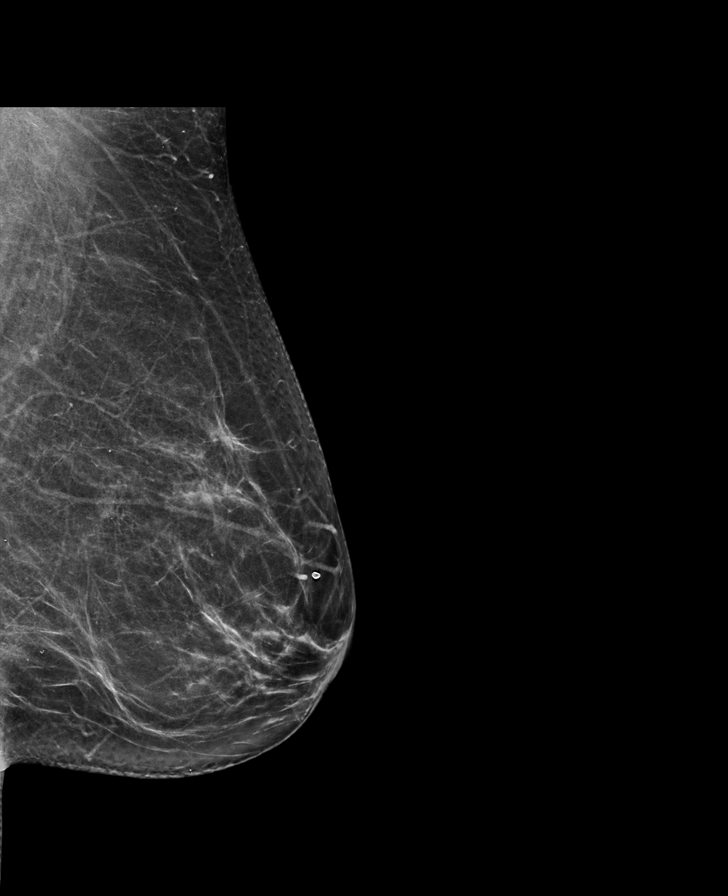

[L MLO]
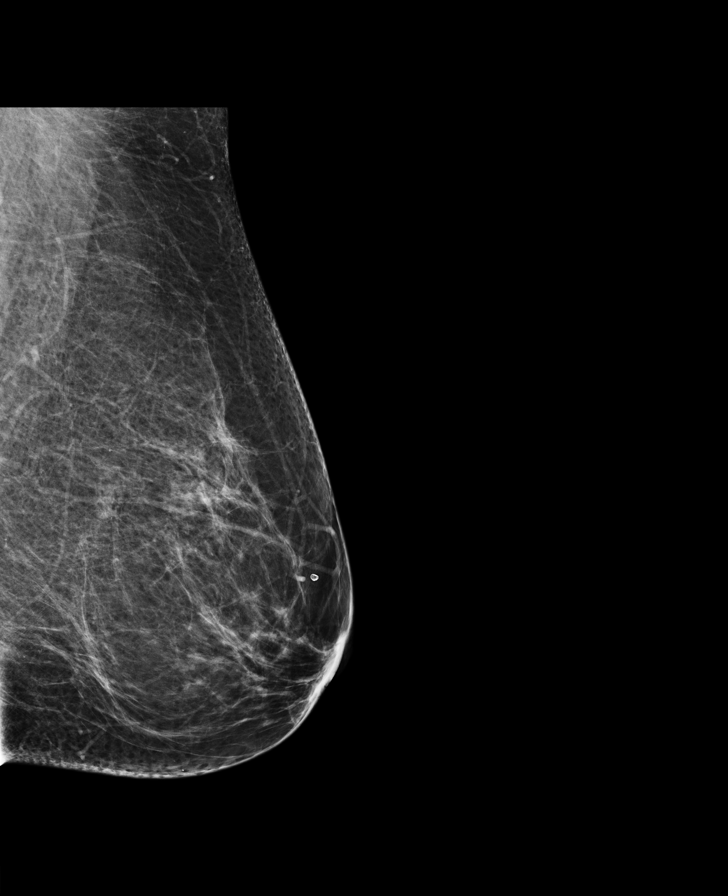

[R CC]
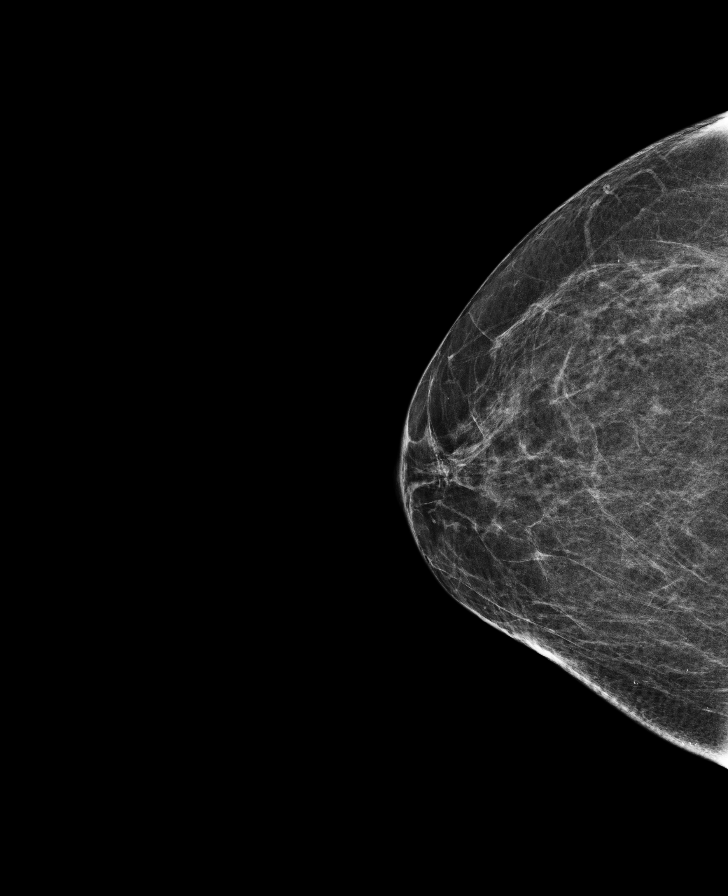

[R CC synth-2D]
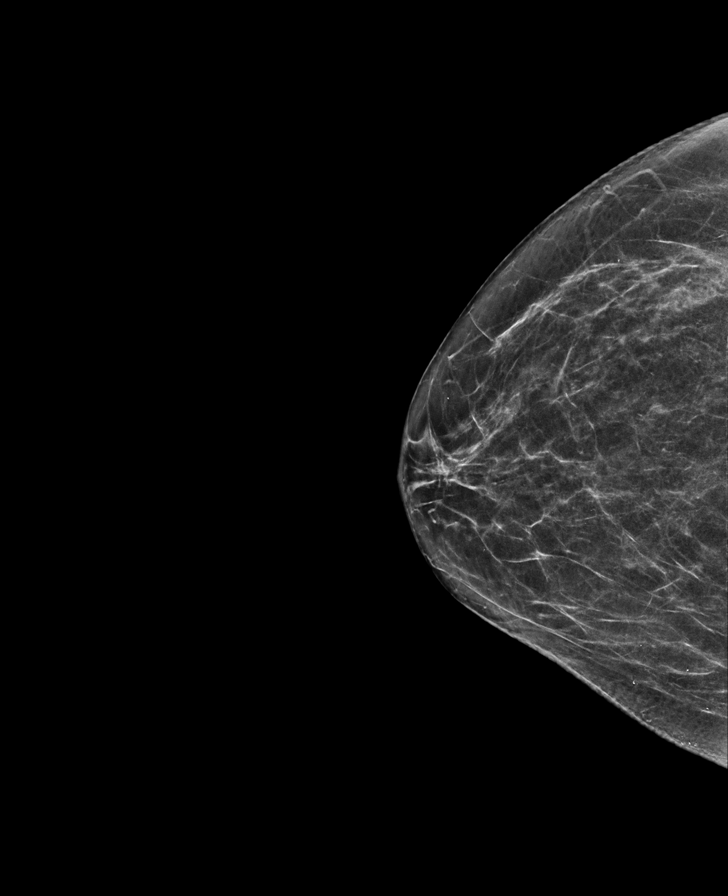

[R MLO synth-2D]
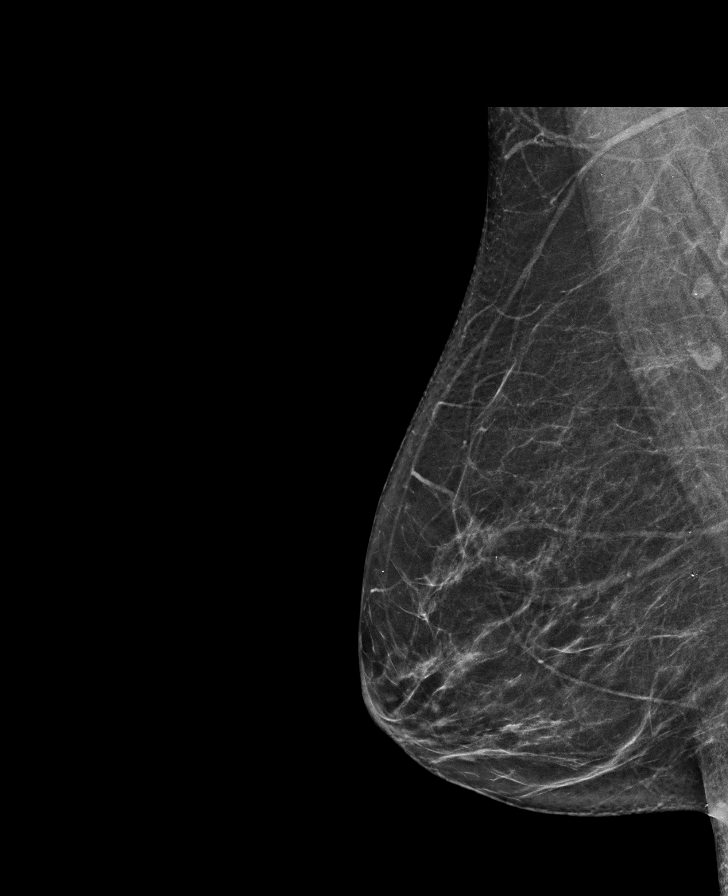

[8 of 28 positions shown; findings below may reference images not displayed]

ACR Breast Density Category b: There are scattered areas of
fibroglandular density.
FINDINGS: There are no findings suspicious for malignancy. Images were
processed with CAD.
IMPRESSION: No mammographic evidence of malignancy. A result letter of this
screening mammogram will be mailed directly to the patient.

RECOMMENDATION:
Screening mammogram in one year. (Code:97-6-RS4)

BI-RADS CATEGORY  1: Negative.

## 2019-02-19 ENCOUNTER — Other Ambulatory Visit: Payer: Self-pay | Admitting: Family Medicine

## 2019-02-19 DIAGNOSIS — Z1231 Encounter for screening mammogram for malignant neoplasm of breast: Secondary | ICD-10-CM

## 2019-03-22 ENCOUNTER — Other Ambulatory Visit: Payer: Self-pay

## 2019-03-24 ENCOUNTER — Other Ambulatory Visit: Payer: Self-pay

## 2019-03-24 ENCOUNTER — Ambulatory Visit: Payer: Medicare HMO | Admitting: Gynecology

## 2019-03-24 ENCOUNTER — Encounter: Payer: Self-pay | Admitting: Gynecology

## 2019-03-24 VITALS — BP 118/74 | Ht 68.0 in | Wt 188.0 lb

## 2019-03-24 DIAGNOSIS — Z9189 Other specified personal risk factors, not elsewhere classified: Secondary | ICD-10-CM

## 2019-03-24 DIAGNOSIS — N952 Postmenopausal atrophic vaginitis: Secondary | ICD-10-CM | POA: Diagnosis not present

## 2019-03-24 DIAGNOSIS — Z01419 Encounter for gynecological examination (general) (routine) without abnormal findings: Secondary | ICD-10-CM

## 2019-03-24 DIAGNOSIS — E559 Vitamin D deficiency, unspecified: Secondary | ICD-10-CM

## 2019-03-24 DIAGNOSIS — Z7251 High risk heterosexual behavior: Secondary | ICD-10-CM

## 2019-03-24 DIAGNOSIS — R1031 Right lower quadrant pain: Secondary | ICD-10-CM | POA: Diagnosis not present

## 2019-03-24 DIAGNOSIS — R3915 Urgency of urination: Secondary | ICD-10-CM

## 2019-03-24 DIAGNOSIS — Z1272 Encounter for screening for malignant neoplasm of vagina: Secondary | ICD-10-CM

## 2019-03-24 DIAGNOSIS — G8929 Other chronic pain: Secondary | ICD-10-CM

## 2019-03-24 NOTE — Progress Notes (Signed)
[name] [birth year] [id number]        62 y.o.  [id number] for breast and pelvic exam.  Former patient of Dr. Toney Rakes.  Has not been seen for several years.  Also complaining of a 61-month history of right lower quadrant pain which started intermittently but now has progressed to a dull ache daily.  No urinary symptoms such as frequency dysuria urgency low back pain fever or chills.  No GI symptoms such as constipation or diarrhea.  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  Had laparoscopic lysis of adhesions in 2009.  Past medical history,surgical history, problem list, medications, allergies, family history and social history were all reviewed and documented as reviewed in the EPIC chart.  ROS:  Performed with pertinent positives and negatives included in the history, assessment and plan.   Additional significant findings : None   Exam: Caryn Bee assistant Vitals:   03/24/19 1106  BP: 118/74  Weight: 188 lb (85.3 kg)  Height: 5\' 8"  (1.727 m)   Body mass index is 28.59 kg/m.  General appearance:  Normal affect, orientation and appearance. Skin: Grossly normal HEENT: Without gross lesions.  No cervical or supraclavicular adenopathy. Thyroid normal.  Lungs:  Clear without wheezing, rales or rhonchi Cardiac: RR, without RMG Abdominal:  Soft, nontender, without masses, guarding, rebound, organomegaly or hernia Breasts:  Examined lying and sitting without masses, retractions, discharge or axillary adenopathy. Pelvic:  Ext, BUS, Vagina: Normal with atrophic changes Pap smear of vaginal cuff done  Adnexa: Without masses or tenderness    Anus and perineum: Normal   Rectovaginal: Normal sphincter tone without palpated masses or tenderness.    Assessment/Plan:  63 y.o. [id number] female for breast and pelvic exam.  1. Right lower quadrant pain.  Exam overall is normal.  No localizing GI or GU symptoms.  Will start with pelvic ultrasound to  rule out ovarian pathology given the history of the left ovarian cyst.  She will schedule in follow-up for this.  Urine analysis also ordered today to rule out urinary input to her pain. 2. Urgency symptoms.  Patient having some urgency with urination.  Will wake in the middle the night and have incontinence episodes if she does not make it to the toilet fast enough.  No pain with urination.  No stress symptoms such as loss of urine with coughing sneezing laughing.  No low back pain fever or chills.  Check urine analysis today.  Discussed possibilities for behavior modification to include diet changes as well as options for OAB medication.  Patient not interested in OAB medication at this time.  Ultimately possible urology referral if worsens or continues to be a significant issue. 3. Pap smear 2011.  Pap smear done today.  Discussed current screening guidelines and options to stop screening based on hysterectomy history.  No history of abnormal Pap smears previously.  As she is new to me I did a baseline Pap smear. 4. Mammography scheduled in June and she will follow-up for this.  Breast exam normal today. 5. DEXA 2015 normal.  She is being treated by Dr. Coletta Memos for vitamin D deficiency.  Recommend baseline DEXA now given the deficiency history.  Will schedule in follow-up for this. 6. Colonoscopy 2016.  Repeat at their recommended interval. 7. Health maintenance.  No routine lab work done as patient reports is done elsewhere.  Follow-up for ultrasound and bone density as scheduled.  Follow-up in 1 year for  annual exam.  Additional time in excess of her breast and pelvic exam was spent in direct face to face counseling and coordination of care in regards to her right lower quadrant pain and urinary urgency evaluation.    Anastasio Auerbach MD, 1:52 PM 03/24/2019

## 2019-03-24 NOTE — Patient Instructions (Signed)
Follow-up for the ultrasound and bone density as scheduled.

## 2019-03-25 LAB — PAP IG W/ RFLX HPV ASCU

## 2019-03-26 LAB — URINALYSIS, COMPLETE W/RFL CULTURE
Bacteria, UA: NONE SEEN /HPF
Bilirubin Urine: NEGATIVE
Glucose, UA: NEGATIVE
Hgb urine dipstick: NEGATIVE
Hyaline Cast: NONE SEEN /LPF
Ketones, ur: NEGATIVE
Leukocyte Esterase: NEGATIVE
Nitrites, Initial: NEGATIVE
Protein, ur: NEGATIVE
RBC / HPF: NONE SEEN /HPF (ref 0–2)
Specific Gravity, Urine: 1.019 (ref 1.001–1.03)
Squamous Epithelial / LPF: NONE SEEN /HPF (ref <=5)
pH: <=5 (ref 5.0–8.0)

## 2019-03-26 LAB — URINE CULTURE
MICRO NUMBER:: 474483
Result:: NO GROWTH
SPECIMEN QUALITY:: ADEQUATE

## 2019-03-26 LAB — CULTURE INDICATED

## 2019-04-07 ENCOUNTER — Other Ambulatory Visit: Payer: Self-pay

## 2019-04-08 ENCOUNTER — Other Ambulatory Visit: Payer: Medicare HMO

## 2019-04-08 ENCOUNTER — Ambulatory Visit: Payer: Medicare HMO | Admitting: Gynecology

## 2019-04-21 ENCOUNTER — Other Ambulatory Visit: Payer: Self-pay

## 2019-04-21 ENCOUNTER — Ambulatory Visit
Admission: RE | Admit: 2019-04-21 | Discharge: 2019-04-21 | Disposition: A | Discharge status: Home / Self Care | Payer: Medicare HMO | Source: Ambulatory Visit | Attending: Family Medicine | Admitting: Family Medicine

## 2019-04-21 DIAGNOSIS — Z1231 Encounter for screening mammogram for malignant neoplasm of breast: Secondary | ICD-10-CM

## 2019-04-22 ENCOUNTER — Ambulatory Visit: Payer: Medicare HMO | Admitting: Gynecology

## 2019-04-22 ENCOUNTER — Other Ambulatory Visit: Payer: Medicare HMO

## 2019-05-07 ENCOUNTER — Other Ambulatory Visit: Payer: Self-pay

## 2019-06-21 ENCOUNTER — Other Ambulatory Visit (HOSPITAL_COMMUNITY): Payer: Self-pay | Admitting: Family Medicine

## 2019-06-21 DIAGNOSIS — I739 Peripheral vascular disease, unspecified: Secondary | ICD-10-CM

## 2019-06-23 ENCOUNTER — Ambulatory Visit (HOSPITAL_COMMUNITY)
Admission: RE | Admit: 2019-06-23 | Discharge: 2019-06-23 | Disposition: A | Discharge status: Home / Self Care | Payer: Medicare HMO | Source: Ambulatory Visit | Attending: Family Medicine | Admitting: Family Medicine

## 2019-06-23 ENCOUNTER — Other Ambulatory Visit: Payer: Self-pay

## 2019-06-23 DIAGNOSIS — I739 Peripheral vascular disease, unspecified: Secondary | ICD-10-CM

## 2019-08-10 ENCOUNTER — Encounter: Payer: Self-pay | Admitting: Gynecology

## 2020-03-07 NOTE — Progress Notes (Signed)
[facility] Note  03/08/2020     CHIEF COMPLAINT Patient presents for Retina Evaluation   HISTORY OF PRESENT ILLNESS: [name] is a 64 y.o. female who presents to the clinic today for:   HPI    Retina Evaluation    In right eye.  This started 4 days ago.  Duration of 4 days.  Associated Symptoms Floaters and Flashes.  Negative for Distortion, Blind Spot, Pain, Redness, Photophobia, Glare, Trauma, Scalp Tenderness, Jaw Claudication, Shoulder/Hip pain, Fever, Weight Loss and Fatigue.  Context:  distance vision, mid-range vision and near vision.  Treatments tried include no treatments.  I, the attending physician,  performed the HPI with the patient and updated documentation appropriately.          Comments    Patient c/o vertical strip of light in vision OD, when patient moves eye. Started on 04.23.21. Some floaters OD. No curtain/veil. No shadow in vision. No headache. No nausea. Denies symptoms OS. Vision seems decreased OD for the past couple of months.        Last edited by Bernarda Caffey, MD on 03/09/2020 11:55 PM. (History)    pt has hx of retinal tear and laser in 1985 (repaired at [facility]), pt is here on the referral of Dr. Virgina Evener for flashes and floaters OD, pt states she started to see a vertical light in her vision on Friday and then Saturday she noticed a floater in her vision, she states she had pressure behind her eye as well, but seems to have resolved  Referring physician: Virgina Evener, [address] [place],   [address]  HISTORICAL INFORMATION:   Selected notes from the MEDICAL RECORD NUMBER Referred by Dr. Virgina Evener for PVD OD in pt with history of RT s/p laser retinopexy OD in [place]: No current outpatient medications on file. (Ophthalmic Drugs)   No current facility-administered medications for this visit. (Ophthalmic Drugs)   Current Outpatient  Medications (Other)  Medication Sig  . alprazolam (XANAX) 2 MG tablet Take 2 mg by mouth 3 (three) times daily as needed.    Marland Kitchen aspirin 81 MG tablet Take 81 mg by mouth every other day.   . Calcium 500 MG tablet Take 500 mg by mouth daily.  . clobetasol cream (TEMOVATE) [id number] % Apply 1 application topically 2 (two) times daily.  Marland Kitchen HYDROcodone-acetaminophen (NORCO) 5-325 MG per tablet Take 1 tablet by mouth every 6 (six) hours as needed.  . hydrocortisone 2.5 % cream Apply 1 application topically as directed.  Astrid Drafts Omega-3 300 MG CAPS Take by mouth.  . polyethylene glycol (MIRALAX) 17 g packet Take by mouth as directed.  . triamcinolone ointment (KENALOG) 0.1 % Apply 1 application topically as directed.  . venlafaxine (EFFEXOR) 37.5 MG tablet Take 37.5 mg by mouth 2 (two) times daily.  Marland Kitchen estradiol (ESTRACE) 0.1 MG/GM vaginal cream Place 1 Applicatorful vaginally 2 (two) times a week. (Patient not taking: Reported on 03/24/2019)   No current facility-administered medications for this visit. (Other)      REVIEW OF SYSTEMS: ROS    Positive for: Musculoskeletal, Eyes   Negative for: Constitutional, Gastrointestinal, Neurological, Skin, Genitourinary, HENT, Endocrine, Cardiovascular, Respiratory, Psychiatric, Allergic/Imm, Heme/Lymph   Last edited by Roselee Nova D, COT on 03/08/2020 12:46 PM. (History)       ALLERGIES Allergies  Allergen Reactions  . Codeine Nausea Only  . Diphenhydramine Hcl  PAST MEDICAL HISTORY Past Medical History:  Diagnosis Date  . Anxiety   . Bipolar disorder ([place])   . Chest pain    Myocardial Perfusion Study 02/08/02 - Abnormal study suggesting ischemia in anterior to anteroseptal wall to mid to upper mid ventricular region w/associated hypocontractibility. Clinical correlation is recommended. EF=46% (Dr. Corky Downs)  . Cystic mesothelioma    LARGE MESOTHELIAL CYST  . Decreased pulse    Lower arterial duplex scan - 02/18/08 - NORMAL  . Depression    . 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 Mental disorder   . Ovarian neoplasm    LARGE OVARIAN NEOPLASM  . Palpitations    Cardiac CTA 03/31/08 - mild cardiomegaly. Small amount of pericardial fluid in the pericardial recess adjacent to the ascending aorta  . Rectocele, female   . SUI (stress urinary incontinence, female)   . Tachycardia    Past Surgical History:  Procedure Laterality Date  . ABDOMINAL HYSTERECTOMY  1982   TAH  . ANKLE SURGERY Right 1998  . CARDIAC CATHETERIZATION  03/15/02   No evidence of CAD  . CHOLECYSTECTOMY  1998  . KNEE SURGERY Right 1985  . LAMINECTOMY    . OOPHORECTOMY Left 1998   LSO - Benign tumor  . POSTERIOR REPAIR  1985    FAMILY HISTORY Family History  Problem Relation Age of Onset  . Diabetes Paternal Grandmother 38  . Cancer Paternal Grandmother 70  . Heart disease Paternal Grandfather   . Heart attack Paternal Grandfather 105  . Cancer Maternal Grandmother 44  . Heart attack Maternal Grandfather 58  . Heart defect Child   . Diabetes Father   . Heart disease Father   . Heart attack Father   . Lymphoma Maternal Uncle   . Rheumatic fever Maternal Uncle   . Heart attack Cousin 69    SOCIAL HISTORY Social History   Tobacco Use  . Smoking status: Current Every Day Smoker    Packs/day: 1.00    Years: 23.00    Pack years: 23.00    Types: Cigarettes  . Smokeless tobacco: Never Used  . Tobacco comment: VAPING  Substance Use Topics  . Alcohol use: No    Alcohol/week: 0.0 standard drinks  . Drug use: No         OPHTHALMIC EXAM:  Base Eye Exam    Visual Acuity (Snellen - Linear)      Right Left   Dist cc 20/40 +1 20/30 -1   Dist ph cc 20/25 -2 20/25 -2   Correction: Glasses       Tonometry (Tonopen, 1:06 PM)      Right Left   Pressure 17 17       Pupils      Dark Light Shape React APD   Right 4 3 Round Brisk None   Left 4 3 Round Brisk None       Visual Fields (Counting fingers)      Left Right    Full Full       Dilation     Both eyes: 1.0% Mydriacyl, 2.5% Phenylephrine @ 1:06 PM        Slit Lamp and Fundus Exam    Slit Lamp Exam      Right Left   Lids/Lashes Dermatochalasis - upper lid Dermatochalasis - upper lid, mild Meibomian gland dysfunction   Conjunctiva/Sclera White and quiet White and quiet   Cornea Arcus, trace Debris in tear film, trace Punctate epithelial erosions Arcus, trace Debris in tear film,  trace Punctate epithelial erosions   Anterior Chamber Deep and quiet Deep and quiet   Iris Round and dilated Round and dilated   Lens 2+ Nuclear sclerosis, 2+ Cortical cataract 2+ Nuclear sclerosis, 2+ Cortical cataract   Vitreous Vitreous syneresis, Posterior vitreous detachment Vitreous syneresis       Fundus Exam      Right Left   Disc Pink and Sharp, mild, temporal PPA Pink and Sharp, mild, temporal PPA, +cupping   C/D Ratio 0.5 0.65   Macula Flat, Good foveal reflex, mild Retinal pigment epithelial mottling, No heme or edema Flat, Good foveal reflex, mild Retinal pigment epithelial mottling, No heme or edema   Vessels Vascular attenuation, Tortuous, AV crossing changes Vascular attenuation, Tortuous   Periphery Attached, pigmented laser scars at 0900; no RT/RD on scleral depressed exam Attached        Refraction    Wearing Rx      Sphere Cylinder Axis   Right +3.00 +0.75 103   Left +3.00 +0.50 085       Manifest Refraction      Sphere Cylinder Axis Dist VA   Right +3.50 +0.25 100 20/30+1   Left +3.50 +0.50 082 20/20-1          IMAGING AND PROCEDURES  Imaging and Procedures for @TODAY @  OCT, Retina - OU - Both Eyes       Right Eye Quality was good. Central Foveal Thickness: 246. Progression has no prior data. Findings include normal foveal contour, no IRF, no SRF (Trace vitreous opacities).   Left Eye Quality was good. Central Foveal Thickness: 250. Progression has no prior data. Findings include normal foveal contour, no IRF, no SRF, vitreomacular adhesion .    Notes *Images captured and stored on drive  Diagnosis / Impression:  NFP, no IRF/SRF OU  Clinical management:  See below  Abbreviations: NFP - Normal foveal profile. CME - cystoid macular edema. PED - pigment epithelial detachment. IRF - intraretinal fluid. SRF - subretinal fluid. EZ - ellipsoid zone. ERM - epiretinal membrane. ORA - outer retinal atrophy. ORT - outer retinal tubulation. SRHM - subretinal hyper-reflective material                 ASSESSMENT/PLAN:    ICD-10-CM   1. Posterior vitreous detachment of right eye  H43.811   2. Retinal edema  H35.81 OCT, Retina - OU - Both Eyes  3. History of repair of retinal tear by laser photocoagulation  Z98.890   4. Combined forms of age-related cataract of both eyes  H25.813     1,2. PVD / vitreous syneresis OD   - Discussed findings and prognosis  - No RT or RD on 360 scleral depressed exam  - Reviewed s/s of RT/RD  - Strict return precautions for any such RT/RD signs/symptoms  - f/u 4-6 weeks  3. History of retinal tear OD  - s/p laser ([place])  4. Mixed cataracts OU  - The symptoms of cataract, surgical options, and treatments and risks were discussed with patient.  - discussed diagnosis and progression  - not yet visually significant  - monitor for now   Ophthalmic Meds Ordered this visit:  No orders of the defined types were placed in this encounter.      Return for f/u 4-6 weeks, PVD OD, DFE, OCT.  There are no Patient Instructions on file for this visit.   Explained the diagnoses, plan, and follow up with the patient and they expressed understanding.  Patient  expressed understanding of the importance of proper follow up care.   This document serves as a record of services personally performed by Gardiner Sleeper, MD, PhD. It was created on their behalf by Ernest Mallick, OA, an ophthalmic assistant. The creation of this record is the provider's dictation and/or activities during the visit.     Electronically signed by: Ernest Mallick, OA 04.28.2021 11:58 PM  Gardiner Sleeper, M.D., Ph.D. Diseases & Surgery of the Retina and Vitreous [facility]  I have reviewed the above documentation for accuracy and completeness, and I agree with the above. Gardiner Sleeper, M.D., Ph.D. 03/09/20 11:58 PM   Abbreviations: M myopia (nearsighted); A astigmatism; H hyperopia (farsighted); P presbyopia; Mrx spectacle prescription;  CTL contact lenses; OD right eye; OS left eye; OU both eyes  XT exotropia; ET esotropia; PEK punctate epithelial keratitis; PEE punctate epithelial erosions; DES dry eye syndrome; MGD meibomian gland dysfunction; ATs artificial tears; PFAT's preservative free artificial tears; [place] nuclear sclerotic cataract; PSC posterior subcapsular cataract; ERM epi-retinal membrane; PVD posterior vitreous detachment; RD retinal detachment; DM diabetes mellitus; DR diabetic retinopathy; NPDR non-proliferative diabetic retinopathy; PDR proliferative diabetic retinopathy; CSME clinically significant macular edema; DME diabetic macular edema; dbh dot blot hemorrhages; CWS cotton wool spot; POAG primary open angle glaucoma; C/D cup-to-disc ratio; HVF humphrey visual field; GVF goldmann visual field; OCT optical coherence tomography; IOP intraocular pressure; BRVO Branch retinal vein occlusion; CRVO central retinal vein occlusion; CRAO central retinal artery occlusion; BRAO branch retinal artery occlusion; RT retinal tear; SB scleral buckle; PPV pars plana vitrectomy; VH Vitreous hemorrhage; PRP panretinal laser photocoagulation; IVK intravitreal kenalog; VMT vitreomacular traction; MH Macular hole;  NVD neovascularization of the disc; NVE neovascularization elsewhere; AREDS age related eye disease study; ARMD age related macular degeneration; POAG primary open angle glaucoma; EBMD epithelial/anterior basement membrane dystrophy; ACIOL anterior chamber intraocular lens; IOL  intraocular lens; PCIOL posterior chamber intraocular lens; Phaco/IOL phacoemulsification with intraocular lens placement; [place] photorefractive keratectomy; LASIK laser assisted in situ keratomileusis; HTN hypertension; DM diabetes mellitus; COPD chronic obstructive pulmonary disease

## 2020-03-08 ENCOUNTER — Ambulatory Visit (INDEPENDENT_AMBULATORY_CARE_PROVIDER_SITE_OTHER): Payer: Medicare Other | Admitting: Ophthalmology

## 2020-03-08 ENCOUNTER — Encounter (INDEPENDENT_AMBULATORY_CARE_PROVIDER_SITE_OTHER): Payer: Self-pay | Admitting: Ophthalmology

## 2020-03-08 DIAGNOSIS — H3581 Retinal edema: Secondary | ICD-10-CM

## 2020-03-08 DIAGNOSIS — H25813 Combined forms of age-related cataract, bilateral: Secondary | ICD-10-CM

## 2020-03-08 DIAGNOSIS — Z9889 Other specified postprocedural states: Secondary | ICD-10-CM | POA: Diagnosis not present

## 2020-03-08 DIAGNOSIS — H43811 Vitreous degeneration, right eye: Secondary | ICD-10-CM | POA: Diagnosis not present

## 2020-03-09 ENCOUNTER — Encounter (INDEPENDENT_AMBULATORY_CARE_PROVIDER_SITE_OTHER): Payer: Self-pay | Admitting: Ophthalmology

## 2020-03-13 ENCOUNTER — Other Ambulatory Visit: Payer: Self-pay | Admitting: Family Medicine

## 2020-03-13 DIAGNOSIS — Z1231 Encounter for screening mammogram for malignant neoplasm of breast: Secondary | ICD-10-CM

## 2020-04-04 NOTE — Progress Notes (Signed)
[facility] Note  04/07/2020     CHIEF COMPLAINT Patient presents for Retina Follow Up   HISTORY OF PRESENT ILLNESS: [name] is a 64 y.o. female who presents to the clinic today for:   HPI    Retina Follow Up    Patient presents with  PVD.  In right eye.  This started weeks ago.  Severity is moderate.  Duration of weeks.  Since onset it is stable.  I, the attending physician,  performed the HPI with the patient and updated documentation appropriately.          Comments    Pt states her floater in her right eye is still present and staying in one spot.  Pt states floater OD is more pronounced and darker now than it was.  Patient states floater does not go away and is always present.  Only has FOL OD when looking to the left.  Pt denies eye pain.       Last edited by Bernarda Caffey, MD on 04/09/2020 10:45 PM. (History)    pt has states her floaters have not gotten better, but they have changed shape, she has not noticed any new flashes, but still has the same vertical light that comes when she moves her eye  Referring physician: Bernerd Limbo, MD [place] [address],  [place] [address]  HISTORICAL INFORMATION:   Selected notes from the MEDICAL RECORD NUMBER Referred by Dr. Virgina Evener for PVD OD in pt with history of RT s/p laser retinopexy OD in [place]: No current outpatient medications on file. (Ophthalmic Drugs)   No current facility-administered medications for this visit. (Ophthalmic Drugs)   Current Outpatient Medications (Other)  Medication Sig  . alprazolam (XANAX) 2 MG tablet Take 2 mg by mouth 3 (three) times daily as needed.    Marland Kitchen aspirin 81 MG tablet Take 81 mg by mouth every other day.   . Calcium 500 MG tablet Take 500 mg by mouth daily.  . clobetasol cream (TEMOVATE) [id number] % Apply 1 application topically 2 (two) times daily.  Marland Kitchen estradiol (ESTRACE) 0.1 MG/GM vaginal cream Place 1  Applicatorful vaginally 2 (two) times a week. (Patient not taking: Reported on 03/24/2019)  . HYDROcodone-acetaminophen (NORCO) 5-325 MG per tablet Take 1 tablet by mouth every 6 (six) hours as needed.  . hydrocortisone 2.5 % cream Apply 1 application topically as directed.  Astrid Drafts Omega-3 300 MG CAPS Take by mouth.  . polyethylene glycol (MIRALAX) 17 g packet Take by mouth as directed.  . triamcinolone ointment (KENALOG) 0.1 % Apply 1 application topically as directed.  . venlafaxine (EFFEXOR) 37.5 MG tablet Take 37.5 mg by mouth 2 (two) times daily.   No current facility-administered medications for this visit. (Other)      REVIEW OF SYSTEMS: ROS    Positive for: Musculoskeletal, Eyes   Negative for: Constitutional, Gastrointestinal, Neurological, Skin, Genitourinary, HENT, Endocrine, Cardiovascular, Respiratory, Psychiatric, Allergic/Imm, Heme/Lymph   Last edited by Doneen Poisson on 04/07/2020  2:19 PM. (History)       ALLERGIES Allergies  Allergen Reactions  . Codeine Nausea Only  . Diphenhydramine Hcl     PAST MEDICAL HISTORY Past Medical History:  Diagnosis Date  . Anxiety   . Bipolar disorder ([place])   . Chest pain    Myocardial Perfusion Study 02/08/02 - Abnormal study suggesting ischemia in anterior to anteroseptal wall to mid to upper mid  ventricular region w/associated hypocontractibility. Clinical correlation is recommended. EF=46% (Dr. Corky Downs)  . Cystic mesothelioma    LARGE MESOTHELIAL CYST  . Decreased pulse    Lower arterial duplex scan - 02/18/08 - NORMAL  . Depression   . 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 Mental disorder   . Ovarian neoplasm    LARGE OVARIAN NEOPLASM  . Palpitations    Cardiac CTA 03/31/08 - mild cardiomegaly. Small amount of pericardial fluid in the pericardial recess adjacent to the ascending aorta  . Rectocele, female   . SUI (stress urinary incontinence, female)   . Tachycardia    Past Surgical History:  Procedure Laterality Date  .  ABDOMINAL HYSTERECTOMY  1982   TAH  . ANKLE SURGERY Right 1998  . CARDIAC CATHETERIZATION  03/15/02   No evidence of CAD  . CHOLECYSTECTOMY  1998  . KNEE SURGERY Right 1985  . LAMINECTOMY    . OOPHORECTOMY Left 1998   LSO - Benign tumor  . POSTERIOR REPAIR  1985    FAMILY HISTORY Family History  Problem Relation Age of Onset  . Diabetes Paternal Grandmother 59  . Cancer Paternal Grandmother 43  . Heart disease Paternal Grandfather   . Heart attack Paternal Grandfather 15  . Cancer Maternal Grandmother 53  . Heart attack Maternal Grandfather 58  . Heart defect Child   . Diabetes Father   . Heart disease Father   . Heart attack Father   . Lymphoma Maternal Uncle   . Rheumatic fever Maternal Uncle   . Heart attack Cousin 48    SOCIAL HISTORY Social History   Tobacco Use  . Smoking status: Current Every Day Smoker    Packs/day: 1.00    Years: 23.00    Pack years: 23.00    Types: Cigarettes  . Smokeless tobacco: Never Used  . Tobacco comment: VAPING  Substance Use Topics  . Alcohol use: No    Alcohol/week: 0.0 standard drinks  . Drug use: No         OPHTHALMIC EXAM:  Base Eye Exam    Visual Acuity (Snellen - Linear)      Right Left   Dist cc 20/40 +2 20/20 -2   Dist ph cc 20/25 -2    Correction: Glasses       Tonometry (Tonopen, 2:30 PM)      Right Left   Pressure 21 17       Pupils      Dark Light Shape React APD   Right 3 2 Round Brisk 0   Left 3 2 Round Brisk 0       Visual Fields      Left Right    Full Full       Extraocular Movement      Right Left    Full Full       Neuro/Psych    Oriented x3: Yes   Mood/Affect: Normal       Dilation    Both eyes: 1.0% Mydriacyl, 2.5% Phenylephrine @ 2:31PM        Slit Lamp and Fundus Exam    Slit Lamp Exam      Right Left   Lids/Lashes Dermatochalasis - upper lid Dermatochalasis - upper lid, mild Meibomian gland dysfunction   Conjunctiva/Sclera White and quiet White and quiet   Cornea  Arcus, trace Debris in tear film, trace Punctate epithelial erosions Arcus, trace Debris in tear film, trace Punctate epithelial erosions   Anterior Chamber Deep and quiet Deep  and quiet   Iris Round and dilated Round and dilated   Lens 2+ Nuclear sclerosis, 2+ Cortical cataract 2+ Nuclear sclerosis, 2+ Cortical cataract   Vitreous Vitreous syneresis, Posterior vitreous detachment Vitreous syneresis       Fundus Exam      Right Left   Disc Pink and Sharp, mild, temporal PPA Pink and Sharp, +PPP, +cupping   C/D Ratio 0.5 0.5   Macula Flat, Good foveal reflex, mild Retinal pigment epithelial mottling, No heme or edema Flat, Good foveal reflex, mild Retinal pigment epithelial mottling, No heme or edema   Vessels Vascular attenuation, Tortuous Vascular attenuation, Tortuous   Periphery Attached, pigmented laser scars at 0900; no RT/RD on exam Attached        Refraction    Wearing Rx      Sphere Cylinder Axis   Right +3.00 +0.75 103   Left +3.00 +0.50 085          IMAGING AND PROCEDURES  Imaging and Procedures for @TODAY @  OCT, Retina - OU - Both Eyes       Right Eye Quality was good. Central Foveal Thickness: 241. Progression has improved. Findings include normal foveal contour, no IRF, no SRF (Trace vitreous opacities - improved).   Left Eye Quality was good. Central Foveal Thickness: 249. Progression has no prior data. Findings include normal foveal contour, no IRF, no SRF, vitreomacular adhesion .   Notes *Images captured and stored on drive  Diagnosis / Impression:  NFP, no IRF/SRF OU OD: tr vit opacities improved  Clinical management:  See below  Abbreviations: NFP - Normal foveal profile. CME - cystoid macular edema. PED - pigment epithelial detachment. IRF - intraretinal fluid. SRF - subretinal fluid. EZ - ellipsoid zone. ERM - epiretinal membrane. ORA - outer retinal atrophy. ORT - outer retinal tubulation. SRHM - subretinal hyper-reflective material                  ASSESSMENT/PLAN:    ICD-10-CM   1. Posterior vitreous detachment of right eye  H43.811   2. Retinal edema  H35.81 OCT, Retina - OU - Both Eyes  3. History of repair of retinal tear by laser photocoagulation  Z98.890   4. Combined forms of age-related cataract of both eyes  H25.813     1,2. PVD / vitreous syneresis OD   - Discussed findings and prognosis  - No RT or RD on 360 scleral depressed exam  - Reviewed s/s of RT/RD  - Strict return precautions for any such RT/RD signs/symptoms  - f/u 6 weeks  3. History of retinal tear OD  - s/p laser ([place])  4. Mixed cataracts OU  - The symptoms of cataract, surgical options, and treatments and risks were discussed with patient.  - discussed diagnosis and progression  - not yet visually significant  - monitor for now   Ophthalmic Meds Ordered this visit:  No orders of the defined types were placed in this encounter.      Return in about 6 weeks (around 05/19/2020) for f/u 6 weeks, PVD OD, DFE, OCT.  There are no Patient Instructions on file for this visit.   Explained the diagnoses, plan, and follow up with the patient and they expressed understanding.  Patient expressed understanding of the importance of proper follow up care.   This document serves as a record of services personally performed by Gardiner Sleeper, MD, PhD. It was created on their behalf by Ernest Mallick, OA, an ophthalmic  assistant. The creation of this record is the provider's dictation and/or activities during the visit.    Electronically signed by: Ernest Mallick, OA 05.25.2021 10:48 PM  Gardiner Sleeper, M.D., Ph.D. Diseases & Surgery of the Retina and Vitreous [facility]  I have reviewed the above documentation for accuracy and completeness, and I agree with the above. Gardiner Sleeper, M.D., Ph.D. 04/09/20 10:49 PM   Abbreviations: M myopia (nearsighted); A astigmatism; H hyperopia (farsighted); P  presbyopia; Mrx spectacle prescription;  CTL contact lenses; OD right eye; OS left eye; OU both eyes  XT exotropia; ET esotropia; PEK punctate epithelial keratitis; PEE punctate epithelial erosions; DES dry eye syndrome; MGD meibomian gland dysfunction; ATs artificial tears; PFAT's preservative free artificial tears; [place] nuclear sclerotic cataract; PSC posterior subcapsular cataract; ERM epi-retinal membrane; PVD posterior vitreous detachment; RD retinal detachment; DM diabetes mellitus; DR diabetic retinopathy; NPDR non-proliferative diabetic retinopathy; PDR proliferative diabetic retinopathy; CSME clinically significant macular edema; DME diabetic macular edema; dbh dot blot hemorrhages; CWS cotton wool spot; POAG primary open angle glaucoma; C/D cup-to-disc ratio; HVF humphrey visual field; GVF goldmann visual field; OCT optical coherence tomography; IOP intraocular pressure; BRVO Branch retinal vein occlusion; CRVO central retinal vein occlusion; CRAO central retinal artery occlusion; BRAO branch retinal artery occlusion; RT retinal tear; SB scleral buckle; PPV pars plana vitrectomy; VH Vitreous hemorrhage; PRP panretinal laser photocoagulation; IVK intravitreal kenalog; VMT vitreomacular traction; MH Macular hole;  NVD neovascularization of the disc; NVE neovascularization elsewhere; AREDS age related eye disease study; ARMD age related macular degeneration; POAG primary open angle glaucoma; EBMD epithelial/anterior basement membrane dystrophy; ACIOL anterior chamber intraocular lens; IOL intraocular lens; PCIOL posterior chamber intraocular lens; Phaco/IOL phacoemulsification with intraocular lens placement; [place] photorefractive keratectomy; LASIK laser assisted in situ keratomileusis; HTN hypertension; DM diabetes mellitus; COPD chronic obstructive pulmonary disease

## 2020-04-07 ENCOUNTER — Other Ambulatory Visit: Payer: Self-pay

## 2020-04-07 ENCOUNTER — Ambulatory Visit (INDEPENDENT_AMBULATORY_CARE_PROVIDER_SITE_OTHER): Payer: Medicare Other | Admitting: Ophthalmology

## 2020-04-07 DIAGNOSIS — H25813 Combined forms of age-related cataract, bilateral: Secondary | ICD-10-CM

## 2020-04-07 DIAGNOSIS — Z9889 Other specified postprocedural states: Secondary | ICD-10-CM

## 2020-04-07 DIAGNOSIS — H43811 Vitreous degeneration, right eye: Secondary | ICD-10-CM | POA: Diagnosis not present

## 2020-04-07 DIAGNOSIS — H3581 Retinal edema: Secondary | ICD-10-CM | POA: Diagnosis not present

## 2020-04-09 ENCOUNTER — Encounter (INDEPENDENT_AMBULATORY_CARE_PROVIDER_SITE_OTHER): Payer: Self-pay | Admitting: Ophthalmology

## 2020-04-24 ENCOUNTER — Ambulatory Visit
Admission: RE | Admit: 2020-04-24 | Discharge: 2020-04-24 | Disposition: A | Discharge status: Home / Self Care | Payer: Medicare HMO | Source: Ambulatory Visit | Attending: Family Medicine | Admitting: Family Medicine

## 2020-04-24 ENCOUNTER — Other Ambulatory Visit: Payer: Self-pay

## 2020-04-24 DIAGNOSIS — Z1231 Encounter for screening mammogram for malignant neoplasm of breast: Secondary | ICD-10-CM

## 2020-05-11 ENCOUNTER — Encounter (HOSPITAL_COMMUNITY): Payer: Self-pay

## 2020-05-11 ENCOUNTER — Ambulatory Visit (HOSPITAL_COMMUNITY)
Admission: EM | Admit: 2020-05-11 | Discharge: 2020-05-11 | Disposition: A | Discharge status: Home / Self Care | Payer: Medicare Other | Attending: Internal Medicine | Admitting: Internal Medicine

## 2020-05-11 ENCOUNTER — Other Ambulatory Visit: Payer: Self-pay

## 2020-05-11 DIAGNOSIS — Z20822 Contact with and (suspected) exposure to covid-19: Secondary | ICD-10-CM | POA: Insufficient documentation

## 2020-05-11 DIAGNOSIS — J0100 Acute maxillary sinusitis, unspecified: Secondary | ICD-10-CM

## 2020-05-11 LAB — SARS CORONAVIRUS 2 (TAT 6-24 HRS): SARS Coronavirus 2: NEGATIVE

## 2020-05-11 MED ORDER — DOXYCYCLINE HYCLATE 100 MG PO TABS: 100.0000 mg | ORAL_TABLET | Freq: Two times a day (BID) | ORAL | 0 refills | Status: AC

## 2020-05-11 NOTE — ED Triage Notes (Signed)
Pt c/o sinus pain and HAx4 days.

## 2020-05-11 NOTE — ED Provider Notes (Signed)
[place] [facility]    CSN: [id number] Arrival date & time: 05/11/20  1451      History   Chief Complaint Chief Complaint  Patient presents with  . Sinusitis    HPI [name] is a 64 y.o. female with past medical history of anxiety, bipolar disorder presents neck pain and headache.  Patient states symptoms ongoing for 4 days becoming increasingly worse.  Symptoms are associated with nasal congestion and left ear pain.  She denies any fever or chills, no cough, no chest pain or shortness of breath.  Patient received Covid vaccine x2 with last one being March/2021.  Patient has tried Sudafed for symptoms though she states this medication makes her feel "jittery" and no longer taking.   Past Medical History:  Diagnosis Date  . Anxiety   . Bipolar disorder ([place])   . Chest pain    Myocardial Perfusion Study 02/08/02 - Abnormal study suggesting ischemia in anterior to anteroseptal wall to mid to upper mid ventricular region w/associated hypocontractibility. Clinical correlation is recommended. EF=46% (Dr. Corky Downs)  . Cystic mesothelioma    LARGE MESOTHELIAL CYST  . Decreased pulse    Lower arterial duplex scan - 02/18/08 - NORMAL  . Depression   . 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 Mental disorder   . Ovarian neoplasm    LARGE OVARIAN NEOPLASM  . Palpitations    Cardiac CTA 03/31/08 - mild cardiomegaly. Small amount of pericardial fluid in the pericardial recess adjacent to the ascending aorta  . Rectocele, female   . SUI (stress urinary incontinence, female)   . Tachycardia     Patient Active Problem List   Diagnosis Date Noted  . Hearing loss 08/19/2016  . Cystic mesothelioma   . SUI (stress urinary incontinence, female)   . Ovarian neoplasm   . Rectocele, female   . HSV infection   . BACK PAIN 04/15/2008  . CALF PAIN, LEFT 01/29/2008  . PHARYNGITIS, ACUTE 06/24/2007  . SCIATICA, LEFT 04/20/2007  . OBESITY NOS 01/15/2007  . BULIMIA NERVOSA 01/15/2007  . SYMPTOM,  VOMITING ALONE 01/15/2007  . BIPOLAR DISORDER 01/08/2007  . TOBACCO DEPENDENCE 01/08/2007  . ORTHOSTATIC HYPOTENSION 01/08/2007  . PALPITATIONS 01/08/2007    Past Surgical History:  Procedure Laterality Date  . ABDOMINAL HYSTERECTOMY  1982   TAH  . ANKLE SURGERY Right 1998  . CARDIAC CATHETERIZATION  03/15/02   No evidence of CAD  . CHOLECYSTECTOMY  1998  . KNEE SURGERY Right 1985  . LAMINECTOMY    . OOPHORECTOMY Left 1998   LSO - Benign tumor  . POSTERIOR REPAIR  1985    OB History    Gravida  9   Para  5   Term      Preterm      AB  4   Living  4     SAB  4   TAB      Ectopic      Multiple      Live Births               Home Medications    Prior to Admission medications   Medication Sig Start Date End Date Taking? Authorizing Provider  alprazolam Duanne Moron) 2 MG tablet Take 2 mg by mouth 3 (three) times daily as needed.      [provider]  aspirin 81 MG tablet Take 81 mg by mouth every other day.     [provider]  Calcium 500 MG tablet Take  500 mg by mouth daily.    [provider]  clobetasol cream (TEMOVATE) 1.59 % Apply 1 application topically 2 (two) times daily.    [provider]  doxycycline (VIBRA-TABS) 100 MG tablet Take 1 tablet (100 mg total) by mouth 2 (two) times daily for 7 days. 05/11/20 05/18/20  Rudolpho Sevin, NP  estradiol (ESTRACE) 0.1 MG/GM vaginal cream Place 1 Applicatorful vaginally 2 (two) times a week. Patient not taking: Reported on 03/24/2019 08/15/16   Terrance Mass, MD  HYDROcodone-acetaminophen [facility]) 5-325 MG per tablet Take 1 tablet by mouth every 6 (six) hours as needed.    [provider]  hydrocortisone 2.5 % cream Apply 1 application topically as directed. 02/16/19   [provider]  Astrid Drafts Omega-3 300 MG CAPS Take by mouth.    [provider]  polyethylene glycol (MIRALAX) 17 g packet Take by mouth as directed.    [provider]    triamcinolone ointment (KENALOG) 0.1 % Apply 1 application topically as directed. 09/30/16   [provider]  venlafaxine (EFFEXOR) 37.5 MG tablet Take 37.5 mg by mouth 2 (two) times daily.    [provider]    Family History Family History  Problem Relation Age of Onset  . Diabetes Paternal Grandmother 64  . Cancer Paternal Grandmother 39  . Heart disease Paternal Grandfather   . Heart attack Paternal Grandfather 53  . Cancer Maternal Grandmother 44  . Heart attack Maternal Grandfather 58  . Heart defect Child   . Diabetes Father   . Heart disease Father   . Heart attack Father   . Lymphoma Maternal Uncle   . Rheumatic fever Maternal Uncle   . Heart attack Cousin 24    Social History Social History   Tobacco Use  . Smoking status: Former Smoker    Packs/day: 1.00    Years: 23.00    Pack years: 23.00    Types: Cigarettes  . Smokeless tobacco: Never Used  Vaping Use  . Vaping Use: Every day  . Substances: Nicotine  Substance Use Topics  . Alcohol use: Yes    Alcohol/week: 0.0 standard drinks    Comment: occ  . Drug use: No     Allergies   Codeine, Diphenhydramine hcl, and Nasal decongestant [pseudoephedrine]   Review of Systems Review of Systems   Physical Exam Triage Vital Signs ED Triage Vitals  Enc Vitals Group     BP 05/11/20 1506 140/67     Pulse Rate 05/11/20 1506 (!) 111     Resp 05/11/20 1506 16     Temp 05/11/20 1506 98.4 F (36.9 C)     Temp Source 05/11/20 1506 Oral     SpO2 05/11/20 1506 98 %     Weight 05/11/20 1507 197 lb (89.4 kg)     Height 05/11/20 1507 5\' 8"  (1.727 m)     Head Circumference --      Peak Flow --      Pain Score 05/11/20 1506 6     Pain Loc --      Pain Edu? --      Excl. in [place]? --    No data found.  Updated Vital Signs BP 140/67   Pulse (!) 111   Temp 98.4 F (36.9 C) (Oral)   Resp 16   Ht 5\' 8"  (1.727 m)   Wt 89.4 kg   SpO2 98%   BMI 29.95 kg/m      Physical  Exam Constitutional:      Appearance: Normal appearance.  HENT:     Right Ear: Ear canal and external ear normal.     Left Ear: Ear canal and external ear normal.     Ears:     Comments: Clear fluid behind TMs bilaterally left greater than right.  Mild erythema to left TM along bottom edge without any exit    Mouth/Throat:     Mouth: Mucous membranes are moist.     Pharynx: Oropharynx is clear. Posterior oropharyngeal erythema present. No oropharyngeal exudate.  Cardiovascular:     Rate and Rhythm: Normal rate and regular rhythm.  Pulmonary:     Effort: Pulmonary effort is normal.     Breath sounds: Normal breath sounds.  Neurological:     Mental Status: She is alert.  Psychiatric:        Mood and Affect: Mood normal.      UC Treatments / Results  Labs (all labs ordered are listed, but only abnormal results are displayed) Labs Reviewed  SARS CORONAVIRUS 2 (TAT 6-24 HRS)    EKG   Radiology No results found.  Procedures Procedures (including critical care time)  Medications Ordered in UC Medications - No data to display  Initial Impression / Assessment and Plan / UC Course  I have reviewed the triage vital signs and the nursing notes.  Pertinent labs & imaging results that were available during my care of the patient were reviewed by me and considered in my medical decision making (see chart for details).  Acute sinusitis -Likely viral though patient preparing to travel and concerned she may get sicker.  Will prescribe doxycycline twice daily x7 days. -Also discussed use of nasal saline and fluticasone to help relieve symptoms -Fluids -R/o Covid   Final Clinical Impressions(s) / UC Diagnoses   Final diagnoses:  Acute non-recurrent maxillary sinusitis     Discharge Instructions     An antibiotic has been called in for you.  To be careful in the sun when taking this antibiotic as you may burn more easily.  Saline nasal spray and fluticasone nasal spray  would be very helpful.  You can pick these up at the drugstore.  Your Covid test results should be available in MyChart tomorrow.   ED Prescriptions    Medication Sig Dispense Auth. Provider   doxycycline (VIBRA-TABS) 100 MG tablet Take 1 tablet (100 mg total) by mouth 2 (two) times daily for 7 days. 14 tablet Rudolpho Sevin, NP     PDMP not reviewed this encounter.   Rudolpho Sevin, NP 05/11/20 1542

## 2020-05-11 NOTE — Discharge Instructions (Addendum)
An antibiotic has been called in for you.  To be careful in the sun when taking this antibiotic as you may burn more easily.  Saline nasal spray and fluticasone nasal spray would be very helpful.  You can pick these up at the drugstore.  Your Covid test results should be available in MyChart tomorrow.

## 2020-05-31 ENCOUNTER — Encounter (INDEPENDENT_AMBULATORY_CARE_PROVIDER_SITE_OTHER): Payer: Medicare Other | Admitting: Ophthalmology

## 2020-10-09 ENCOUNTER — Encounter: Payer: Self-pay | Admitting: Obstetrics and Gynecology

## 2020-10-09 ENCOUNTER — Other Ambulatory Visit: Payer: Self-pay

## 2020-10-09 ENCOUNTER — Ambulatory Visit: Payer: Medicare Other | Admitting: Obstetrics and Gynecology

## 2020-10-09 VITALS — BP 120/82

## 2020-10-09 DIAGNOSIS — N898 Other specified noninflammatory disorders of vagina: Secondary | ICD-10-CM

## 2020-10-09 DIAGNOSIS — L700 Acne vulgaris: Secondary | ICD-10-CM

## 2020-10-09 LAB — WET PREP FOR TRICH, YEAST, CLUE

## 2020-10-09 NOTE — Progress Notes (Signed)
[name] [birth year] [id number]  SUBJECTIVE:  64 y.o. [id number] female presents for evaluation of a persistent red bump over the last few months in the hairbearing area of the mons pubis.  No pain or itching.  No irritation.  No drainage or fluctuance or purulence. She did not see an ingrown hair on magnifying the area.  Does not shave in this area. Also concerned with a possible abnormal vaginal odor out vaginal discharge or bleeding.  Current Outpatient Medications  Medication Sig Dispense Refill  . alprazolam (XANAX) 2 MG tablet Take 2 mg by mouth 3 (three) times daily as needed.      Marland Kitchen aspirin 81 MG tablet Take 81 mg by mouth every other day.     . Calcium 500 MG tablet Take 500 mg by mouth daily.    . clobetasol cream (TEMOVATE) 6.46 % Apply 1 application topically 2 (two) times daily.    Marland Kitchen HYDROcodone-acetaminophen (NORCO) 5-325 MG per tablet Take 1 tablet by mouth every 6 (six) hours as needed.    . hydrocortisone 2.5 % cream Apply 1 application topically as directed.    [name] Omega-3 300 MG CAPS Take by mouth.    . polyethylene glycol (MIRALAX) 17 g packet Take by mouth as directed.    . triamcinolone ointment (KENALOG) 0.1 % Apply 1 application topically as directed.    . venlafaxine (EFFEXOR) 37.5 MG tablet Take 37.5 mg by mouth 2 (two) times daily.    . Vitamin D, Ergocalciferol, (DRISDOL) 1.25 MG (50000 UNIT) CAPS capsule Take 50,000 Units by mouth every 7 (seven) days.    Marland Kitchen estradiol (ESTRACE) 0.1 MG/GM vaginal cream Place 1 Applicatorful vaginally 2 (two) times a week. (Patient not taking: Reported on 10/09/2020) 42.5 g 8   No current facility-administered medications for this visit.   Allergies: Codeine, Diphenhydramine hcl, and Nasal decongestant [pseudoephedrine]  No LMP recorded. Patient has had a hysterectomy.  Past medical history,surgical history, problem list, medications, allergies, family history and social history were all reviewed and documented as  reviewed in the EPIC chart.  ROS: No urinary symptoms.  Remainder of ROS negative as described in HPI.   OBJECTIVE:  BP 120/82  The patient appears well, alert, oriented, in no distress. PELVIC EXAM: VULVA: normal appearing vulva with no masses, tenderness or lesions, at the right aspect of the mons pubis there is a 5 mm red slightly raised lesion without ulceration or fluctuance.  No drainage or purulence.  Palpation of the area reveals mild tenderness.  VAGINA: normal appearing vagina with normal color and discharge, no lesions, CERVIX: surgically absent, WET MOUNT done - results: negative for pathogens, normal epithelial cells  Chaperone: [name] present during the examination  ASSESSMENT:  64 y.o. [id number] here with suspected comedone in the hairbearing area of the mons pubis  PLAN:  I suggested that she try warm compresses 3-4 times a day on the area for a few weeks to hopefully help allow the presumed oil plug to relieve itself.  We discussed that with a comedone places possible for the skin to continue to have a low-grade infection which results in the slightly erythematous and raised appearance.  If there is any development of purulence, pustules, or spreading erythema then she should be reevaluated. Vaginal wet mount is negative.  The vaginal odor that she is noticing is probably from vaginal pH changes in menopause which we discussed can change the flora and therefore may lead to development of a different  odor than what she is accustomed to.  No abnormal odor is noted on the examination today.   Joseph Pierini MD 10/09/20

## 2021-03-11 ENCOUNTER — Inpatient Hospital Stay (HOSPITAL_COMMUNITY)
Admission: EM | Admit: 2021-03-11 | Discharge: 2021-03-13 | DRG: 637 | Disposition: A | Discharge status: Home / Self Care | Payer: Medicare Other | Attending: Family Medicine | Admitting: Family Medicine

## 2021-03-11 ENCOUNTER — Encounter (HOSPITAL_COMMUNITY): Payer: Self-pay | Admitting: Internal Medicine

## 2021-03-11 ENCOUNTER — Other Ambulatory Visit: Payer: Self-pay

## 2021-03-11 ENCOUNTER — Inpatient Hospital Stay (HOSPITAL_COMMUNITY): Payer: Medicare Other

## 2021-03-11 DIAGNOSIS — Z87891 Personal history of nicotine dependence: Secondary | ICD-10-CM

## 2021-03-11 DIAGNOSIS — D484 Neoplasm of uncertain behavior of peritoneum: Secondary | ICD-10-CM | POA: Diagnosis present

## 2021-03-11 DIAGNOSIS — Z7982 Long term (current) use of aspirin: Secondary | ICD-10-CM

## 2021-03-11 DIAGNOSIS — E1165 Type 2 diabetes mellitus with hyperglycemia: Secondary | ICD-10-CM | POA: Diagnosis present

## 2021-03-11 DIAGNOSIS — E872 Acidosis, unspecified: Secondary | ICD-10-CM | POA: Diagnosis present

## 2021-03-11 DIAGNOSIS — K859 Acute pancreatitis without necrosis or infection, unspecified: Secondary | ICD-10-CM | POA: Diagnosis present

## 2021-03-11 DIAGNOSIS — D751 Secondary polycythemia: Secondary | ICD-10-CM | POA: Diagnosis present

## 2021-03-11 DIAGNOSIS — E1100 Type 2 diabetes mellitus with hyperosmolarity without nonketotic hyperglycemic-hyperosmolar coma (NKHHC): Secondary | ICD-10-CM | POA: Diagnosis present

## 2021-03-11 DIAGNOSIS — R748 Abnormal levels of other serum enzymes: Secondary | ICD-10-CM | POA: Diagnosis not present

## 2021-03-11 DIAGNOSIS — Z79899 Other long term (current) drug therapy: Secondary | ICD-10-CM | POA: Diagnosis not present

## 2021-03-11 DIAGNOSIS — Z20822 Contact with and (suspected) exposure to covid-19: Secondary | ICD-10-CM | POA: Diagnosis present

## 2021-03-11 DIAGNOSIS — R7401 Elevation of levels of liver transaminase levels: Secondary | ICD-10-CM | POA: Diagnosis present

## 2021-03-11 DIAGNOSIS — F419 Anxiety disorder, unspecified: Secondary | ICD-10-CM | POA: Diagnosis present

## 2021-03-11 DIAGNOSIS — F319 Bipolar disorder, unspecified: Secondary | ICD-10-CM | POA: Diagnosis present

## 2021-03-11 DIAGNOSIS — Z9049 Acquired absence of other specified parts of digestive tract: Secondary | ICD-10-CM

## 2021-03-11 DIAGNOSIS — N179 Acute kidney failure, unspecified: Secondary | ICD-10-CM | POA: Diagnosis present

## 2021-03-11 DIAGNOSIS — D72829 Elevated white blood cell count, unspecified: Secondary | ICD-10-CM | POA: Diagnosis not present

## 2021-03-11 DIAGNOSIS — E876 Hypokalemia: Secondary | ICD-10-CM | POA: Diagnosis present

## 2021-03-11 DIAGNOSIS — E860 Dehydration: Secondary | ICD-10-CM | POA: Diagnosis present

## 2021-03-11 DIAGNOSIS — K760 Fatty (change of) liver, not elsewhere classified: Secondary | ICD-10-CM | POA: Diagnosis present

## 2021-03-11 DIAGNOSIS — K858 Other acute pancreatitis without necrosis or infection: Secondary | ICD-10-CM | POA: Diagnosis not present

## 2021-03-11 DIAGNOSIS — R059 Cough, unspecified: Secondary | ICD-10-CM | POA: Diagnosis present

## 2021-03-11 DIAGNOSIS — Z90710 Acquired absence of both cervix and uterus: Secondary | ICD-10-CM

## 2021-03-11 DIAGNOSIS — E119 Type 2 diabetes mellitus without complications: Secondary | ICD-10-CM | POA: Diagnosis not present

## 2021-03-11 DIAGNOSIS — Z833 Family history of diabetes mellitus: Secondary | ICD-10-CM | POA: Diagnosis not present

## 2021-03-11 LAB — BASIC METABOLIC PANEL
Anion gap: 18 — ABNORMAL HIGH (ref 5–15)
Anion gap: 17 — ABNORMAL HIGH (ref 5–15)
Anion gap: 12 (ref 5–15)
BUN: 21 mg/dL (ref 8–23)
BUN: 21 mg/dL (ref 8–23)
BUN: 15 mg/dL (ref 8–23)
CO2: 22 mmol/L (ref 22–32)
CO2: 22 mmol/L (ref 22–32)
CO2: 26 mmol/L (ref 22–32)
Calcium: 9.5 mg/dL (ref 8.9–10.3)
Calcium: 9.4 mg/dL (ref 8.9–10.3)
Calcium: 8.7 mg/dL — ABNORMAL LOW (ref 8.9–10.3)
Chloride: 93 mmol/L — ABNORMAL LOW (ref 98–111)
Chloride: 95 mmol/L — ABNORMAL LOW (ref 98–111)
Chloride: 99 mmol/L (ref 98–111)
Creatinine, Ser: 1.26 mg/dL — ABNORMAL HIGH (ref 0.44–1.00)
Creatinine, Ser: 1.35 mg/dL — ABNORMAL HIGH (ref 0.44–1.00)
Creatinine, Ser: 0.92 mg/dL (ref 0.44–1.00)
GFR, Estimated: 47 mL/min — ABNORMAL LOW (ref >60)
GFR, Estimated: 44 mL/min — ABNORMAL LOW (ref >60)
GFR, Estimated: >60 mL/min (ref >60)
Glucose, Bld: 745 mg/dL (ref 70–99)
Glucose, Bld: 669 mg/dL (ref 70–99)
Glucose, Bld: 209 mg/dL — ABNORMAL HIGH (ref 70–99)
Potassium: 4.7 mmol/L (ref 3.5–5.1)
Potassium: 4.3 mmol/L (ref 3.5–5.1)
Potassium: 3.6 mmol/L (ref 3.5–5.1)
Sodium: 133 mmol/L — ABNORMAL LOW (ref 135–145)
Sodium: 134 mmol/L — ABNORMAL LOW (ref 135–145)
Sodium: 137 mmol/L (ref 135–145)

## 2021-03-11 LAB — CBC WITH DIFFERENTIAL/PLATELET
Abs Immature Granulocytes: 0.07 10*3/uL (ref 0.00–0.07)
Basophils Absolute: 0.1 10*3/uL (ref 0.0–0.1)
Basophils Relative: 0 %
Eosinophils Absolute: 0 10*3/uL (ref 0.0–0.5)
Eosinophils Relative: 0 %
HCT: 48.3 % — ABNORMAL HIGH (ref 36.0–46.0)
Hemoglobin: 15.2 g/dL — ABNORMAL HIGH (ref 12.0–15.0)
Immature Granulocytes: 1 %
Lymphocytes Relative: 8 %
Lymphs Abs: 1.2 10*3/uL (ref 0.7–4.0)
MCH: 33.3 pg (ref 26.0–34.0)
MCHC: 31.5 g/dL (ref 30.0–36.0)
MCV: 105.7 fL — ABNORMAL HIGH (ref 80.0–100.0)
Monocytes Absolute: 0.4 10*3/uL (ref 0.1–1.0)
Monocytes Relative: 3 %
Neutro Abs: 13 10*3/uL — ABNORMAL HIGH (ref 1.7–7.7)
Neutrophils Relative %: 88 %
Platelets: 396 10*3/uL (ref 150–400)
RBC: 4.57 MIL/uL (ref 3.87–5.11)
RDW: 13.8 % (ref 11.5–15.5)
WBC: 14.8 10*3/uL — ABNORMAL HIGH (ref 4.0–10.5)
nRBC: 0 % (ref 0.0–0.2)

## 2021-03-11 LAB — CBG MONITORING, ED
Glucose-Capillary: >600 mg/dL (ref 70–99)
Glucose-Capillary: >600 mg/dL (ref 70–99)

## 2021-03-11 LAB — GLUCOSE, CAPILLARY
Glucose-Capillary: 198 mg/dL — ABNORMAL HIGH (ref 70–99)
Glucose-Capillary: 199 mg/dL — ABNORMAL HIGH (ref 70–99)
Glucose-Capillary: 209 mg/dL — ABNORMAL HIGH (ref 70–99)
Glucose-Capillary: 258 mg/dL — ABNORMAL HIGH (ref 70–99)
Glucose-Capillary: 329 mg/dL — ABNORMAL HIGH (ref 70–99)
Glucose-Capillary: 400 mg/dL — ABNORMAL HIGH (ref 70–99)
Glucose-Capillary: 469 mg/dL — ABNORMAL HIGH (ref 70–99)
Glucose-Capillary: 495 mg/dL — ABNORMAL HIGH (ref 70–99)
Glucose-Capillary: 550 mg/dL (ref 70–99)
Glucose-Capillary: >600 mg/dL (ref 70–99)

## 2021-03-11 LAB — URINALYSIS, ROUTINE W REFLEX MICROSCOPIC
Bilirubin Urine: NEGATIVE
Glucose, UA: >=500 mg/dL — AB
Ketones, ur: 15 mg/dL — AB
Leukocytes,Ua: NEGATIVE
Nitrite: NEGATIVE
Protein, ur: NEGATIVE mg/dL
Specific Gravity, Urine: <1.005 — ABNORMAL LOW (ref 1.005–1.030)
pH: 5 (ref 5.0–8.0)

## 2021-03-11 LAB — COMPREHENSIVE METABOLIC PANEL
ALT: 55 U/L — ABNORMAL HIGH (ref 0–44)
AST: 62 U/L — ABNORMAL HIGH (ref 15–41)
Albumin: 4.1 g/dL (ref 3.5–5.0)
Alkaline Phosphatase: 147 U/L — ABNORMAL HIGH (ref 38–126)
Anion gap: 20 — ABNORMAL HIGH (ref 5–15)
BUN: 23 mg/dL (ref 8–23)
CO2: 22 mmol/L (ref 22–32)
Calcium: 9.6 mg/dL (ref 8.9–10.3)
Chloride: 86 mmol/L — ABNORMAL LOW (ref 98–111)
Creatinine, Ser: 1.63 mg/dL — ABNORMAL HIGH (ref 0.44–1.00)
GFR, Estimated: 35 mL/min — ABNORMAL LOW (ref >60)
Glucose, Bld: 1109 mg/dL (ref 70–99)
Potassium: 4.7 mmol/L (ref 3.5–5.1)
Sodium: 128 mmol/L — ABNORMAL LOW (ref 135–145)
Total Bilirubin: 1 mg/dL (ref 0.3–1.2)
Total Protein: 9.1 g/dL — ABNORMAL HIGH (ref 6.5–8.1)

## 2021-03-11 LAB — I-STAT VENOUS BLOOD GAS, ED
Acid-base deficit: 1 mmol/L (ref 0.0–2.0)
Bicarbonate: 24.6 mmol/L (ref 20.0–28.0)
Calcium, Ion: 1.04 mmol/L — ABNORMAL LOW (ref 1.15–1.40)
HCT: 49 % — ABNORMAL HIGH (ref 36.0–46.0)
Hemoglobin: 16.7 g/dL — ABNORMAL HIGH (ref 12.0–15.0)
O2 Saturation: 72 %
Potassium: 4.6 mmol/L (ref 3.5–5.1)
Sodium: 128 mmol/L — ABNORMAL LOW (ref 135–145)
TCO2: 26 mmol/L (ref 22–32)
pCO2, Ven: 42.1 mmHg — ABNORMAL LOW (ref 44.0–60.0)
pH, Ven: 7.374 (ref 7.250–7.430)
pO2, Ven: 39 mmHg (ref 32.0–45.0)

## 2021-03-11 LAB — URINALYSIS, MICROSCOPIC (REFLEX)

## 2021-03-11 LAB — RESP PANEL BY RT-PCR (FLU A&B, COVID) ARPGX2
Influenza A by PCR: NEGATIVE
Influenza B by PCR: NEGATIVE
SARS Coronavirus 2 by RT PCR: NEGATIVE

## 2021-03-11 LAB — LIPASE, BLOOD: Lipase: 382 U/L — ABNORMAL HIGH (ref 11–51)

## 2021-03-11 LAB — OSMOLALITY: Osmolality: 336 mOsm/kg (ref 275–295)

## 2021-03-11 LAB — BETA-HYDROXYBUTYRIC ACID: Beta-Hydroxybutyric Acid: 2.54 mmol/L — ABNORMAL HIGH (ref 0.05–0.27)

## 2021-03-11 MED ORDER — LACTATED RINGERS IV BOLUS
2000.0000 mL | Freq: Once | INTRAVENOUS | Status: AC
  Administered 2021-03-11: 2000 mL via INTRAVENOUS

## 2021-03-11 MED ORDER — LACTATED RINGERS IV SOLN: INTRAVENOUS | Status: DC

## 2021-03-11 MED ORDER — INSULIN ASPART 100 UNIT/ML IJ SOLN
5.0000 [IU] | Freq: Once | INTRAMUSCULAR | Status: AC
  Administered 2021-03-11: 5 [IU] via INTRAVENOUS

## 2021-03-11 MED ORDER — ASPIRIN EC 81 MG PO TBEC
81.0000 mg | DELAYED_RELEASE_TABLET | ORAL | Status: DC
  Administered 2021-03-12: 81 mg via ORAL
  Filled 2021-03-11: qty 1

## 2021-03-11 MED ORDER — POLYVINYL ALCOHOL 1.4 % OP SOLN
1.0000 [drp] | Freq: Two times a day (BID) | OPHTHALMIC | Status: DC | PRN
  Filled 2021-03-11: qty 15

## 2021-03-11 MED ORDER — SODIUM CHLORIDE 0.9 % IV BOLUS
1000.0000 mL | Freq: Once | INTRAVENOUS | Status: AC
  Administered 2021-03-11: 1000 mL via INTRAVENOUS

## 2021-03-11 MED ORDER — HYDROCORTISONE 1 % EX CREA
1.0000 "application " | TOPICAL_CREAM | Freq: Two times a day (BID) | CUTANEOUS | Status: DC
  Administered 2021-03-12 – 2021-03-13 (×3): 1 via TOPICAL
  Filled 2021-03-11 (×2): qty 28

## 2021-03-11 MED ORDER — ALPRAZOLAM 0.5 MG PO TABS
3.0000 mg | ORAL_TABLET | Freq: Every day | ORAL | Status: DC
  Administered 2021-03-11 – 2021-03-12 (×2): 3 mg via ORAL
  Filled 2021-03-11 (×2): qty 6

## 2021-03-11 MED ORDER — TRIAMCINOLONE ACETONIDE 0.1 % EX OINT
1.0000 "application " | TOPICAL_OINTMENT | Freq: Two times a day (BID) | CUTANEOUS | Status: DC | PRN
  Filled 2021-03-11: qty 15

## 2021-03-11 MED ORDER — POTASSIUM CHLORIDE 10 MEQ/100ML IV SOLN
10.0000 meq | INTRAVENOUS | Status: AC
  Administered 2021-03-11 (×2): 10 meq via INTRAVENOUS
  Filled 2021-03-11 (×2): qty 100

## 2021-03-11 MED ORDER — ENOXAPARIN SODIUM 40 MG/0.4ML IJ SOSY
40.0000 mg | PREFILLED_SYRINGE | INTRAMUSCULAR | Status: DC
  Administered 2021-03-11 – 2021-03-12 (×2): 40 mg via SUBCUTANEOUS
  Filled 2021-03-11 (×2): qty 0.4

## 2021-03-11 MED ORDER — DEXTROSE IN LACTATED RINGERS 5 % IV SOLN: INTRAVENOUS | Status: DC

## 2021-03-11 MED ORDER — INSULIN REGULAR(HUMAN) IN NACL 100-0.9 UT/100ML-% IV SOLN
INTRAVENOUS | Status: DC
  Administered 2021-03-11: 13 [IU]/h via INTRAVENOUS
  Administered 2021-03-11: 7.5 [IU]/h via INTRAVENOUS
  Filled 2021-03-11 (×2): qty 100

## 2021-03-11 MED ORDER — ALPRAZOLAM 0.5 MG PO TABS: 1.0000 mg | ORAL_TABLET | Freq: Every day | ORAL | Status: DC | PRN

## 2021-03-11 MED ORDER — LACTATED RINGERS IV BOLUS: 20.0000 mL/kg | Freq: Once | INTRAVENOUS | Status: DC

## 2021-03-11 MED ORDER — DEXTROSE 50 % IV SOLN: 0.0000 mL | INTRAVENOUS | Status: DC | PRN

## 2021-03-11 MED ORDER — HYDROCODONE-ACETAMINOPHEN 5-325 MG PO TABS
1.0000 | ORAL_TABLET | Freq: Two times a day (BID) | ORAL | Status: DC | PRN
Start: 2021-03-11 — End: 2021-03-13

## 2021-03-11 MED ORDER — CLOBETASOL PROPIONATE 0.05 % EX OINT
1.0000 "application " | TOPICAL_OINTMENT | Freq: Two times a day (BID) | CUTANEOUS | Status: DC | PRN
  Filled 2021-03-11: qty 15

## 2021-03-11 NOTE — ED Provider Notes (Signed)
[place] EMERGENCY DEPARTMENT Provider Note   CSN: [id number] Arrival date & time: 03/11/21  1109     History Chief Complaint  Patient presents with  . Hyperglycemia    [name] is a 65 y.o. female presented for evaluation of polyuria, polydipsia, weight loss, fatigue, nausea/vomiting.  Patient states she has had polyuria and polydipsia for the past 3 weeks.  Today she has worsening fatigue and had an episode of nausea and vomiting.  She is not feeling nauseous currently.  She states she has lost about 10 pounds in the past several weeks.  She reports decreased p.o. intake secondary to loss of taste. She reports 10 day h/o nasal congestion, cough, and loss of taste. Had a negative home covid ag test a few days ago. Husband was also sick.  No previous history of diabetes.  She denies recent fevers, chest pain, shortness of breath, abdominal pain, abnormal bowel movements.  No recent change in medications.  HPI     Past Medical History:  Diagnosis Date  . Anxiety   . Bipolar disorder ([place])   . Chest pain    Myocardial Perfusion Study 02/08/02 - Abnormal study suggesting ischemia in anterior to anteroseptal wall to mid to upper mid ventricular region w/associated hypocontractibility. Clinical correlation is recommended. EF=46% (Dr. Corky Downs)  . Cystic mesothelioma    LARGE MESOTHELIAL CYST  . Decreased pulse    Lower arterial duplex scan - 02/18/08 - NORMAL  . Depression   . 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 Mental disorder   . Ovarian neoplasm    LARGE OVARIAN NEOPLASM  . Palpitations    Cardiac CTA 03/31/08 - mild cardiomegaly. Small amount of pericardial fluid in the pericardial recess adjacent to the ascending aorta  . Rectocele, female   . SUI (stress urinary incontinence, female)   . Tachycardia     Patient Active Problem List   Diagnosis Date Noted  . Hyperosmolar hyperglycemic state (HHS) ([place]) 03/11/2021  . Hearing loss 08/19/2016  . Cystic  mesothelioma   . SUI (stress urinary incontinence, female)   . Ovarian neoplasm   . Rectocele, female   . HSV infection   . BACK PAIN 04/15/2008  . CALF PAIN, LEFT 01/29/2008  . PHARYNGITIS, ACUTE 06/24/2007  . SCIATICA, LEFT 04/20/2007  . OBESITY NOS 01/15/2007  . BULIMIA NERVOSA 01/15/2007  . SYMPTOM, VOMITING ALONE 01/15/2007  . BIPOLAR DISORDER 01/08/2007  . TOBACCO DEPENDENCE 01/08/2007  . ORTHOSTATIC HYPOTENSION 01/08/2007  . PALPITATIONS 01/08/2007    Past Surgical History:  Procedure Laterality Date  . ABDOMINAL HYSTERECTOMY  1982   TAH  . ANKLE SURGERY Right 1998  . CARDIAC CATHETERIZATION  03/15/02   No evidence of CAD  . CHOLECYSTECTOMY  1998  . KNEE SURGERY Right 1985  . LAMINECTOMY    . OOPHORECTOMY Left 1998   LSO - Benign tumor  . POSTERIOR REPAIR  1985     OB History    Gravida  9   Para  5   Term      Preterm      AB  4   Living  4     SAB  4   IAB      Ectopic      Multiple      Live Births              Family History  Problem Relation Age of Onset  . Diabetes Paternal Grandmother 63  . Cancer Paternal Grandmother  4  . Heart disease Paternal Grandfather   . Heart attack Paternal Grandfather 27  . Cancer Maternal Grandmother 36  . Heart attack Maternal Grandfather 58  . Heart defect Child   . Diabetes Father   . Heart disease Father   . Heart attack Father   . Lymphoma Maternal Uncle   . Rheumatic fever Maternal Uncle   . Heart attack Cousin 85    Social History   Tobacco Use  . Smoking status: Former Smoker    Packs/day: 1.00    Years: 23.00    Pack years: 23.00    Types: Cigarettes  . Smokeless tobacco: Never Used  Vaping Use  . Vaping Use: Every day  . Substances: Nicotine  Substance Use Topics  . Alcohol use: Yes    Alcohol/week: 0.0 standard drinks    Comment: occ  . Drug use: No    Home Medications Prior to Admission medications   Medication Sig Start Date End Date Taking? Authorizing Provider   alprazolam Duanne Moron) 2 MG tablet Take 2 mg by mouth 3 (three) times daily as needed.      [provider]  aspirin 81 MG tablet Take 81 mg by mouth every other day.     [provider]  Calcium 500 MG tablet Take 500 mg by mouth daily.    [provider]  clobetasol cream (TEMOVATE) 8.84 % Apply 1 application topically 2 (two) times daily.    [provider]  estradiol (ESTRACE) 0.1 MG/GM vaginal cream Place 1 Applicatorful vaginally 2 (two) times a week. Patient not taking: Reported on 10/09/2020 08/15/16   Terrance Mass, MD  HYDROcodone-acetaminophen [facility]) 5-325 MG per tablet Take 1 tablet by mouth every 6 (six) hours as needed.    [provider]  hydrocortisone 2.5 % cream Apply 1 application topically as directed. 02/16/19   [provider]  Astrid Drafts Omega-3 300 MG CAPS Take by mouth.    [provider]  polyethylene glycol (MIRALAX) 17 g packet Take by mouth as directed.    [provider]  triamcinolone ointment (KENALOG) 0.1 % Apply 1 application topically as directed. 09/30/16   [provider]  venlafaxine (EFFEXOR) 37.5 MG tablet Take 37.5 mg by mouth 2 (two) times daily.    [provider]  Vitamin D, Ergocalciferol, (DRISDOL) 1.25 MG (50000 UNIT) CAPS capsule Take 50,000 Units by mouth every 7 (seven) days.    [provider]    Allergies    Codeine, Diphenhydramine hcl, and Nasal decongestant [pseudoephedrine]  Review of Systems   Review of Systems  Constitutional: Positive for unexpected weight change.  HENT:       Loss of taste  Gastrointestinal: Positive for nausea and vomiting.  Endocrine: Positive for polydipsia and polyuria.  Neurological: Positive for weakness.  All other systems reviewed and are negative.   Physical Exam Updated Vital Signs BP 123/63   Pulse 86   Temp 98 F (36.7 C) (Oral)   Resp 17   SpO2 100%   Physical Exam Vitals and nursing note  reviewed.  Constitutional:      General: She is not in acute distress.    Appearance: She is well-developed.     Comments: Appears nontoxic  HENT:     Head: Normocephalic and atraumatic.     Mouth/Throat:     Mouth: Mucous membranes are dry.     Comments: MM dry Eyes:     Extraocular Movements: Extraocular movements intact.  Conjunctiva/sclera: Conjunctivae normal.     Pupils: Pupils are equal, round, and reactive to light.  Cardiovascular:     Rate and Rhythm: Normal rate and regular rhythm.     Pulses: Normal pulses.  Pulmonary:     Effort: Pulmonary effort is normal. No respiratory distress.     Breath sounds: Normal breath sounds. No wheezing.  Abdominal:     General: There is no distension.     Palpations: Abdomen is soft. There is no mass.     Tenderness: There is no abdominal tenderness. There is no guarding or rebound.     Comments: No ttp  Musculoskeletal:        General: Normal range of motion.     Cervical back: Normal range of motion and neck supple.  Skin:    General: Skin is warm and dry.     Capillary Refill: Capillary refill takes less than 2 seconds.  Neurological:     Mental Status: She is alert and oriented to person, place, and time.     ED Results / Procedures / Treatments   Labs (all labs ordered are listed, but only abnormal results are displayed) Labs Reviewed  URINALYSIS, ROUTINE W REFLEX MICROSCOPIC - Abnormal; Notable for the following components:      Result Value   Specific Gravity, Urine <1.005 (*)    Glucose, UA >=500 (*)    Hgb urine dipstick SMALL (*)    Ketones, ur 15 (*)    All other components within normal limits  CBC WITH DIFFERENTIAL/PLATELET - Abnormal; Notable for the following components:   WBC 14.8 (*)    Hemoglobin 15.2 (*)    HCT 48.3 (*)    MCV 105.7 (*)    Neutro Abs 13.0 (*)    All other components within normal limits  COMPREHENSIVE METABOLIC PANEL - Abnormal; Notable for the following components:   Sodium 128  (*)    Chloride 86 (*)    Glucose, Bld 1,109 (*)    Creatinine, Ser 1.63 (*)    Total Protein 9.1 (*)    AST 62 (*)    ALT 55 (*)    Alkaline Phosphatase 147 (*)    GFR, Estimated 35 (*)    Anion gap 20 (*)    All other components within normal limits  LIPASE, BLOOD - Abnormal; Notable for the following components:   Lipase 382 (*)    All other components within normal limits  URINALYSIS, MICROSCOPIC (REFLEX) - Abnormal; Notable for the following components:   Bacteria, UA RARE (*)    All other components within normal limits  CBG MONITORING, ED - Abnormal; Notable for the following components:   Glucose-Capillary >600 (*)    All other components within normal limits  I-STAT VENOUS BLOOD GAS, ED - Abnormal; Notable for the following components:   pCO2, Ven 42.1 (*)    Sodium 128 (*)    Calcium, Ion 1.04 (*)    HCT 49.0 (*)    Hemoglobin 16.7 (*)    All other components within normal limits  URINE CULTURE  RESP PANEL BY RT-PCR (FLU A&B, COVID) ARPGX2  BETA-HYDROXYBUTYRIC ACID  BASIC METABOLIC PANEL  BASIC METABOLIC PANEL  BASIC METABOLIC PANEL  BASIC METABOLIC PANEL  OSMOLALITY  CBG MONITORING, ED    EKG EKG Interpretation  Date/Time:  Sunday Mar 11 2021 11:18:49 EDT Ventricular Rate:  92 PR Interval:  133 QRS Duration: 103 QT Interval:  476 QTC Calculation: 589 R Axis:   78  Text Interpretation: Sinus rhythm Nonspecific repol abnormality, diffuse leads Prolonged QT interval Confirmed by Dene Gentry [phone]) on 03/11/2021 11:20:08 AM   Radiology No results found.  Procedures Procedures   Medications Ordered in ED Medications  lactated ringers bolus 20 mL/kg (has no administration in time range)  insulin regular, human (MYXREDLIN) 100 units/ 100 mL infusion (has no administration in time range)  lactated ringers infusion (has no administration in time range)  dextrose 5 % in lactated ringers infusion (has no administration in time range)  dextrose 50 %  solution 0-50 mL (has no administration in time range)  potassium chloride 10 mEq in 100 mL IVPB (has no administration in time range)  lactated ringers bolus 2,000 mL (has no administration in time range)  sodium chloride 0.9 % bolus 1,000 mL (1,000 mLs Intravenous New Bag/Given 03/11/21 1152)  insulin aspart (novoLOG) injection 5 Units (5 Units Intravenous Given 03/11/21 1254)    ED Course  I have reviewed the triage vital signs and the nursing notes.  Pertinent labs & imaging results that were available during my care of the patient were reviewed by me and considered in my medical decision making (see chart for details).    MDM Rules/Calculators/A&P                           Patient presented for evaluation of polyuria, polydipsia, generalized weakness, nausea, vomiting.  On exam, patient appears nontoxic.  She has no abdominal tenderness on my exam.  She does have dry mucous membranes, and history is most consistent with new onset diabetes.  Concern for DKA versus HHS versus hyperglycemia.  In the setting of abnormal taste, also consider COVID.  Will obtain labs.   I-STAT shows normal potassium and elevated sugar, will give 5 units of insulin while waiting for the rest the labs and a liter of fluid.  Labs concerning for HHS.  Patient with glucose of 1100, gap and minimal ketones.  Bicarb is normal.  pH is normal.  Doubt DKA.  Will start insulin drip for HHS and plan for admission.  Discussed findings with patient and husband, who are agreeable.  Lipase and LFTs minimally elevated, likely reactive to hyperglycemia.  However in the setting nausea vomiting, will obtain noncontrast CT.  Mild AKI when compared to baseline  Discussed with Dr. Tamala Julian from [facility] hospitalist service, he will admit the patient.  Requests 2 more L of fluid.  Final Clinical Impression(s) / ED Diagnoses Final diagnoses:  Hyperosmolar hyperglycemic state (HHS) ([place])  Elevated lipase  AKI (acute kidney injury) [facility])     Rx / DC Orders ED Discharge Orders    None       Franchot Heidelberg, PA-C 03/11/21 1414    Valarie Merino, MD 03/12/21 [phone]

## 2021-03-11 NOTE — H&P (Addendum)
History and Physical    [name] [id number] DOB: [birth year] DOA: 03/11/2021  Referring MD/NP/PA: Franchot Heidelberg, PA-C  PCP: Bernerd Limbo, MD  Patient coming from: home   Chief Complaint: Nausea and vomiting  I have personally briefly reviewed patient's old medical records in [place]   HPI: [name] is a 65 y.o. female with medical history significant of anxiety, depression, and bipolar disorder who presents after having episode of nausea and vomiting today.  She has not been feeling well for about 3 weeks.  Complains of having fatigue, polyuria, polydipsia, dry mouth, change in taste, and some abdominal discomfort.  Patient reports that everything tastes bad except for watermelon.  She had recently been sick with cough and congestion, but had checked an at home COVID test which was negative at that time.  Patient reports that her symptoms of cough and congestion had resolved sometime last week.  She is vaccinated against COVID-19.  Daughter is present bedside reports that the patient has seemed little confused and is repeating herself seems out of the norm.  She admits that she is started taking all of her bipolar medications approximately 6 months ago as she felt she did not need them.  There is a significant family history on her father side for diabetes and heart disease, but she had no prior personal history of diabetes.  She also notes concern for the possibility of thrush, and it seems she had been on nystatin swish and swallow.    ED Course: Upon admission patient was seen to be afebrile with vital signs relatively stable.  Labs significant for WBC 14.8 hemoglobin 16.7, sodium 128, potassium 4.7, chloride 86, BUN 23, creatinine 1.63, glucose 1109, anion gap 20, alkaline phosphatase 147, lipase 382, AST 62, ALT 55, and total bilirubin 1.  Patient was ordered a total of 3 L of IV fluids, 20 mEq of potassium chloride,  and placed on insulin drip.  Review of  Systems  Constitutional: Positive for malaise/fatigue. Negative for fever.  HENT: Negative for nosebleeds and sinus pain.   Eyes: Negative for double vision and photophobia.  Respiratory: Negative for shortness of breath.   Cardiovascular: Negative for chest pain and leg swelling.  Gastrointestinal: Positive for abdominal pain, nausea and vomiting.  Genitourinary: Positive for frequency. Negative for dysuria.  Musculoskeletal: Negative for falls.  Skin: Negative for rash.  Neurological: Positive for weakness. Negative for focal weakness and loss of consciousness.  Endo/Heme/Allergies: Positive for polydipsia.  Psychiatric/Behavioral: Negative for memory loss and substance abuse.    Past Medical History:  Diagnosis Date  . Anxiety   . Bipolar disorder ([place])   . Chest pain    Myocardial Perfusion Study 02/08/02 - Abnormal study suggesting ischemia in anterior to anteroseptal wall to mid to upper mid ventricular region w/associated hypocontractibility. Clinical correlation is recommended. EF=46% (Dr. Corky Downs)  . Cystic mesothelioma    LARGE MESOTHELIAL CYST  . Decreased pulse    Lower arterial duplex scan - 02/18/08 - NORMAL  . Depression   . 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 Mental disorder   . Ovarian neoplasm    LARGE OVARIAN NEOPLASM  . Palpitations    Cardiac CTA 03/31/08 - mild cardiomegaly. Small amount of pericardial fluid in the pericardial recess adjacent to the ascending aorta  . Rectocele, female   . SUI (stress urinary incontinence, female)   . Tachycardia     Past Surgical History:  Procedure Laterality Date  . ABDOMINAL HYSTERECTOMY  1982  TAH  . ANKLE SURGERY Right 1998  . CARDIAC CATHETERIZATION  03/15/02   No evidence of CAD  . CHOLECYSTECTOMY  1998  . KNEE SURGERY Right 1985  . LAMINECTOMY    . OOPHORECTOMY Left 1998   LSO - Benign tumor  . [place]     reports that she has quit smoking. Her smoking use included cigarettes. She has a 23.00 pack-year  smoking history. She has never used smokeless tobacco. She reports current alcohol use. She reports that she does not use drugs.  Allergies  Allergen Reactions  . Codeine Nausea Only  . Diphenhydramine Hcl   . Nasal Decongestant [Pseudoephedrine]     Nervousness, insomnia    Family History  Problem Relation Age of Onset  . Diabetes Paternal Grandmother 22  . Cancer Paternal Grandmother 42  . Heart disease Paternal Grandfather   . Heart attack Paternal Grandfather 83  . Cancer Maternal Grandmother 1  . Heart attack Maternal Grandfather 58  . Heart defect Child   . Diabetes Father   . Heart disease Father   . Heart attack Father   . Lymphoma Maternal Uncle   . Rheumatic fever Maternal Uncle   . Heart attack Cousin 53    Prior to Admission medications   Medication Sig Start Date End Date Taking? Authorizing Provider  alprazolam Duanne Moron) 2 MG tablet Take 2 mg by mouth 3 (three) times daily as needed.      [provider]  aspirin 81 MG tablet Take 81 mg by mouth every other day.     [provider]  Calcium 500 MG tablet Take 500 mg by mouth daily.    [provider]  clobetasol cream (TEMOVATE) 7.62 % Apply 1 application topically 2 (two) times daily.    [provider]  estradiol (ESTRACE) 0.1 MG/GM vaginal cream Place 1 Applicatorful vaginally 2 (two) times a week. Patient not taking: Reported on 10/09/2020 08/15/16   Terrance Mass, MD  HYDROcodone-acetaminophen [facility]) 5-325 MG per tablet Take 1 tablet by mouth every 6 (six) hours as needed.    [provider]  hydrocortisone 2.5 % cream Apply 1 application topically as directed. 02/16/19   [provider]  Astrid Drafts Omega-3 300 MG CAPS Take by mouth.    [provider]  polyethylene glycol (MIRALAX) 17 g packet Take by mouth as directed.    [provider]  triamcinolone ointment (KENALOG) 0.1 % Apply 1 application topically as directed. 09/30/16    [provider]  venlafaxine (EFFEXOR) 37.5 MG tablet Take 37.5 mg by mouth 2 (two) times daily.    [provider]  Vitamin D, Ergocalciferol, (DRISDOL) 1.25 MG (50000 UNIT) CAPS capsule Take 50,000 Units by mouth every 7 (seven) days.    [provider]    Physical Exam:  Constitutional: Elderly female who appears to be alert and able to follow commands Vitals:   03/11/21 1130 03/11/21 1145 03/11/21 1215  BP: (!) 144/61 (!) 142/61 123/63  Pulse: 94 92 86  Resp: 13 11 17   Temp:  98 F (36.7 C)   TempSrc:  Oral   SpO2: 97% 97% 100%   Eyes: PERRL, lids and conjunctivae normal ENMT: Mucous membranes are dry. Posterior pharynx is erythematous with no signs of thrush appreciated. Neck: normal, supple, no masses, no thyromegaly Respiratory: clear to auscultation bilaterally, no wheezing, no crackles. Normal respiratory effort. No accessory muscle use.  Cardiovascular: Regular rate and rhythm, no murmurs /  rubs / gallops. No extremity edema. 2+ pedal pulses. No carotid bruits.  Abdomen: Mild tenderness, no masses palpated. No hepatosplenomegaly. Bowel sounds positive.  Musculoskeletal: no clubbing / cyanosis. No joint deformity upper and lower extremities. Good ROM, no contractures. Normal muscle tone.  Skin: Poor skin turgor appreciated. Neurologic: CN 2-12 grossly intact. Sensation intact, DTR normal. Strength 5/5 in all 4.  Psychiatric: Patient does seem to be repeating herself.  Alert and oriented x 3. Normal mood.     Labs on Admission: I have personally reviewed following labs and imaging studies  CBC: Recent Labs  Lab 03/11/21 1135 03/11/21 1157  WBC 14.8*  --   NEUTROABS 13.0*  --   HGB 15.2* 16.7*  HCT 48.3* 49.0*  MCV 105.7*  --   PLT 396  --    Basic Metabolic Panel: Recent Labs  Lab 03/11/21 1135 03/11/21 1157  NA 128* 128*  K 4.7 4.6  CL 86*  --   CO2 22  --   GLUCOSE 1,109*  --   BUN 23  --   CREATININE 1.63*  --   CALCIUM  9.6  --    GFR: CrCl cannot be calculated (Unknown ideal weight.). Liver Function Tests: Recent Labs  Lab 03/11/21 1135  AST 62*  ALT 55*  ALKPHOS 147*  BILITOT 1.0  PROT 9.1*  ALBUMIN 4.1   Recent Labs  Lab 03/11/21 1135  LIPASE 382*   No results for input(s): AMMONIA in the last 168 hours. Coagulation Profile: No results for input(s): INR, PROTIME in the last 168 hours. Cardiac Enzymes: No results for input(s): CKTOTAL, CKMB, CKMBINDEX, TROPONINI in the last 168 hours. BNP (last 3 results) No results for input(s): PROBNP in the last 8760 hours. HbA1C: No results for input(s): HGBA1C in the last 72 hours. CBG: Recent Labs  Lab 03/11/21 1124  GLUCAP >600*   Lipid Profile: No results for input(s): CHOL, HDL, LDLCALC, TRIG, CHOLHDL, LDLDIRECT in the last 72 hours. Thyroid Function Tests: No results for input(s): TSH, T4TOTAL, FREET4, T3FREE, THYROIDAB in the last 72 hours. Anemia Panel: No results for input(s): VITAMINB12, FOLATE, FERRITIN, TIBC, IRON, RETICCTPCT in the last 72 hours. Urine analysis:    Component Value Date/Time   COLORURINE YELLOW 03/11/2021 [place] 03/11/2021 1123   LABSPEC <1.005 (L) 03/11/2021 1123   PHURINE 5.0 03/11/2021 1123   GLUCOSEU >=500 (A) 03/11/2021 1123   HGBUR SMALL (A) 03/11/2021 1123   BILIRUBINUR NEGATIVE 03/11/2021 1123   KETONESUR 15 (A) 03/11/2021 1123   PROTEINUR NEGATIVE 03/11/2021 1123   UROBILINOGEN 0.2 03/20/2012 1417   NITRITE NEGATIVE 03/11/2021 1123   LEUKOCYTESUR NEGATIVE 03/11/2021 1123   Sepsis Labs: No results found for this or any previous visit (from the past 240 hour(s)).   Radiological Exams on Admission: No results found.  EKG: Independently reviewed.  Sinus rhythm at 92 bpm  Assessment/Plan HHS: Acute.  Patient presented with complaints of polyuria and polydipsia over the last 3 weeks.  She was found to have glucose elevated to 1109 with CO2 22, and anion gap 20.   Pseudohyponatremia appreciated when attributed for hyperglycemia.  Urinalysis was positive for glucose and 15 ketones, but no signs of infection.  Family reporting some mild confusion, but suspect related to hyperglycemia.  Suspecting patient's symptoms more likely related to hyperosmolar hyperglycemic syndrome  vs. diabetic ketoacidosis. -Admit to a progressive bed -Neurochecks -Hyperglycemia order set utilized  -Check hemoglobin A1c and serum osmolarity (336) -N.p.o. until able to transition  off of insulin drip -Check serial BMPs -Continue insulin drip and lactated Ringer's -Correct electrolytes as needed -Monitor for closure of anion gap and we will likely -Diabetes coordinator  Leukocytosis: Acute WBC elevated at 14.8. Urinalysis did not show significant concern for infection.  Question possibility of underlying infection as cause of symptoms.  Influenza and COVID-19 screening were negative.  Chest x-ray showed no acute abnormalities. -Recheck CBC in a.m.  Metabolic acidosis with elevated anion gap: Acute.  Patient reports she has not been able to drink much of anything due to change in taste. -Continue to trend anion gap  Elevated lipase: Acute.  On admission lipase was elevated into the 300s.  Patient complains of some generalized abdominal pain.  Symptoms can be related with HHS, but question possibility of pancreatitis or other intra-abdominal process. -Follow-up CT scan of the abdomen pelvis without contrast  Acute kidney injury: Patient presents with creatinine of 1.63 and BUN 23.  Creatinine previously 0.84 on 03/29/2020.  Suspect secondary to dehydration related to hyperglycemia. -Continue to monitor kidney function  Polycythemia: Hemoglobin 15.2, but suspect this is likely secondary to hemoconcentration from dehydration. -Continue to monitor  Transaminitis: Acute.  Alkaline phosphatase 147, AST 62, and ALT 55. -Recheck LFTs in a.m. -Add on acute hepatitis panel  Question of  thrush: No thrush appreciated on physical exam lower oropharynx.  Patient is at risk for thrush given uncontrolled diabetes mellitus type 2.  Has been on nystatin swish and swallow. -Consider continuing if needed  Bipolar disorder anxiety and depression: Patient reports that she still taking Xanax as needed, but had self discontinued all of her other bipolar medications about 6 months ago. -Continue Xanax as  DVT prophylaxis: Lovenox Code Status: Full  Family Communication: Patient's daughter and husband updated at bedside Disposition Plan: Hopefully discharge home in 2 days Consults called: None Admission status: Inpatient, require more than 2 midnight stay for new onset diabetes Norval Morton MD [facility]   If 7PM-7AM, please contact night-coverage   03/11/2021, 1:58 PM

## 2021-03-12 DIAGNOSIS — R748 Abnormal levels of other serum enzymes: Secondary | ICD-10-CM

## 2021-03-12 DIAGNOSIS — K858 Other acute pancreatitis without necrosis or infection: Secondary | ICD-10-CM

## 2021-03-12 LAB — GLUCOSE, CAPILLARY
Glucose-Capillary: 153 mg/dL — ABNORMAL HIGH (ref 70–99)
Glucose-Capillary: 157 mg/dL — ABNORMAL HIGH (ref 70–99)
Glucose-Capillary: 171 mg/dL — ABNORMAL HIGH (ref 70–99)
Glucose-Capillary: 185 mg/dL — ABNORMAL HIGH (ref 70–99)
Glucose-Capillary: 191 mg/dL — ABNORMAL HIGH (ref 70–99)
Glucose-Capillary: 199 mg/dL — ABNORMAL HIGH (ref 70–99)
Glucose-Capillary: 204 mg/dL — ABNORMAL HIGH (ref 70–99)
Glucose-Capillary: 206 mg/dL — ABNORMAL HIGH (ref 70–99)
Glucose-Capillary: 208 mg/dL — ABNORMAL HIGH (ref 70–99)
Glucose-Capillary: 209 mg/dL — ABNORMAL HIGH (ref 70–99)
Glucose-Capillary: 219 mg/dL — ABNORMAL HIGH (ref 70–99)
Glucose-Capillary: 234 mg/dL — ABNORMAL HIGH (ref 70–99)
Glucose-Capillary: 256 mg/dL — ABNORMAL HIGH (ref 70–99)
Glucose-Capillary: 397 mg/dL — ABNORMAL HIGH (ref 70–99)

## 2021-03-12 LAB — HEPATIC FUNCTION PANEL
ALT: 41 U/L (ref 0–44)
AST: 54 U/L — ABNORMAL HIGH (ref 15–41)
Albumin: 3.1 g/dL — ABNORMAL LOW (ref 3.5–5.0)
Alkaline Phosphatase: 107 U/L (ref 38–126)
Bilirubin, Direct: 0.1 mg/dL (ref 0.0–0.2)
Indirect Bilirubin: 0.6 mg/dL (ref 0.3–0.9)
Total Bilirubin: 0.7 mg/dL (ref 0.3–1.2)
Total Protein: 7.1 g/dL (ref 6.5–8.1)

## 2021-03-12 LAB — BASIC METABOLIC PANEL
Anion gap: 11 (ref 5–15)
Anion gap: 11 (ref 5–15)
BUN: 13 mg/dL (ref 8–23)
BUN: 13 mg/dL (ref 8–23)
CO2: 25 mmol/L (ref 22–32)
CO2: 29 mmol/L (ref 22–32)
Calcium: 8.5 mg/dL — ABNORMAL LOW (ref 8.9–10.3)
Calcium: 8.8 mg/dL — ABNORMAL LOW (ref 8.9–10.3)
Chloride: 103 mmol/L (ref 98–111)
Chloride: 100 mmol/L (ref 98–111)
Creatinine, Ser: 0.83 mg/dL (ref 0.44–1.00)
Creatinine, Ser: 0.86 mg/dL (ref 0.44–1.00)
GFR, Estimated: >60 mL/min (ref >60)
GFR, Estimated: >60 mL/min (ref >60)
Glucose, Bld: 198 mg/dL — ABNORMAL HIGH (ref 70–99)
Glucose, Bld: 178 mg/dL — ABNORMAL HIGH (ref 70–99)
Potassium: 3.2 mmol/L — ABNORMAL LOW (ref 3.5–5.1)
Potassium: 3.3 mmol/L — ABNORMAL LOW (ref 3.5–5.1)
Sodium: 139 mmol/L (ref 135–145)
Sodium: 140 mmol/L (ref 135–145)

## 2021-03-12 LAB — HEMOGLOBIN A1C
Hgb A1c MFr Bld: 11.3 % — ABNORMAL HIGH (ref 4.8–5.6)
Mean Plasma Glucose: 278 mg/dL

## 2021-03-12 LAB — URINE CULTURE: Culture: NO GROWTH

## 2021-03-12 LAB — LIPID PANEL
Cholesterol: 147 mg/dL (ref 0–200)
HDL: 34 mg/dL — ABNORMAL LOW (ref >40)
LDL Cholesterol: 70 mg/dL (ref 0–99)
Total CHOL/HDL Ratio: 4.3 RATIO
Triglycerides: 216 mg/dL — ABNORMAL HIGH (ref <150)
VLDL: 43 mg/dL — ABNORMAL HIGH (ref 0–40)

## 2021-03-12 LAB — HEPATITIS PANEL, ACUTE
HCV Ab: NONREACTIVE
Hep A IgM: NONREACTIVE
Hep B C IgM: NONREACTIVE
Hepatitis B Surface Ag: NONREACTIVE

## 2021-03-12 LAB — LIPASE, BLOOD: Lipase: 201 U/L — ABNORMAL HIGH (ref 11–51)

## 2021-03-12 LAB — MAGNESIUM: Magnesium: 2 mg/dL (ref 1.7–2.4)

## 2021-03-12 LAB — HIV ANTIBODY (ROUTINE TESTING W REFLEX): HIV Screen 4th Generation wRfx: NONREACTIVE

## 2021-03-12 MED ORDER — INSULIN GLARGINE 100 UNIT/ML ~~LOC~~ SOLN
20.0000 [IU] | Freq: Every day | SUBCUTANEOUS | Status: DC
  Administered 2021-03-12 – 2021-03-13 (×2): 20 [IU] via SUBCUTANEOUS
  Filled 2021-03-12 (×2): qty 0.2

## 2021-03-12 MED ORDER — POTASSIUM CHLORIDE CRYS ER 20 MEQ PO TBCR
40.0000 meq | EXTENDED_RELEASE_TABLET | Freq: Once | ORAL | Status: AC
  Administered 2021-03-12: 40 meq via ORAL
  Filled 2021-03-12: qty 2

## 2021-03-12 MED ORDER — LIVING WELL WITH DIABETES BOOK
Freq: Once | Status: AC
  Filled 2021-03-12: qty 1

## 2021-03-12 MED ORDER — INSULIN GLARGINE 100 UNIT/ML ~~LOC~~ SOLN
10.0000 [IU] | Freq: Every day | SUBCUTANEOUS | Status: DC
  Filled 2021-03-12: qty 0.1

## 2021-03-12 MED ORDER — INSULIN ASPART 100 UNIT/ML IJ SOLN: 0.0000 [IU] | Freq: Every day | INTRAMUSCULAR | Status: DC

## 2021-03-12 MED ORDER — GLUCERNA SHAKE PO LIQD: 237.0000 mL | Freq: Three times a day (TID) | ORAL | Status: DC

## 2021-03-12 MED ORDER — SODIUM CHLORIDE 0.9 % IV SOLN: INTRAVENOUS | Status: DC

## 2021-03-12 MED ORDER — POTASSIUM CHLORIDE 20 MEQ PO PACK
40.0000 meq | PACK | Freq: Once | ORAL | Status: AC
  Administered 2021-03-12: 40 meq via ORAL
  Filled 2021-03-12: qty 2

## 2021-03-12 MED ORDER — METFORMIN HCL 500 MG PO TABS
500.0000 mg | ORAL_TABLET | Freq: Two times a day (BID) | ORAL | Status: DC
  Administered 2021-03-13: 500 mg via ORAL
  Filled 2021-03-12: qty 1

## 2021-03-12 MED ORDER — INSULIN ASPART 100 UNIT/ML IJ SOLN
0.0000 [IU] | Freq: Three times a day (TID) | INTRAMUSCULAR | Status: DC
  Administered 2021-03-12: 9 [IU] via SUBCUTANEOUS
  Administered 2021-03-13 (×2): 7 [IU] via SUBCUTANEOUS

## 2021-03-12 MED ORDER — INSULIN ASPART 100 UNIT/ML IJ SOLN: 0.0000 [IU] | Freq: Three times a day (TID) | INTRAMUSCULAR | Status: DC

## 2021-03-12 MED ORDER — INSULIN ASPART 100 UNIT/ML IJ SOLN
0.0000 [IU] | Freq: Three times a day (TID) | INTRAMUSCULAR | Status: DC
Start: 2021-03-12 — End: 2021-03-12
  Administered 2021-03-12: 7 [IU] via SUBCUTANEOUS

## 2021-03-12 MED ORDER — INSULIN STARTER KIT- PEN NEEDLES (ENGLISH)
1.0000 | Freq: Once | Status: AC
  Administered 2021-03-12: 1
  Filled 2021-03-12: qty 1

## 2021-03-12 NOTE — Progress Notes (Signed)
[facility]  PROGRESS NOTE  [name] GUR:[id number] DOB: 1955-12-12 DOA: 03/11/2021 PCP: Bernerd Limbo, MD   Brief HPI:   65 year old female with medical history of anxiety, depression, bipolar disorder who presented with episodes of nausea and vomiting.  Patient has not been feeling well for past 3 weeks.  Complains of having fatigue, polyuria, polydipsia, dry mouth, change in taste and abdominal discomfort.  In the ED she was found to have blood glucose of 11/12/2007, anion gap 20.  Lipase was elevated to 382.  CT abdomen/pelvis without contrast showed peripancreatic fat stranding suggestive of acute pancreatitis.  Hepatic steatosis.    Subjective   Patient seen and examined, CBG has improved this morning.  Consistently below 200s.  Patient is currently on IV insulin   Assessment/Plan:     1. Hyperosmolar hyperglycemic state-patient presented with polyuria, polydipsia for past 3 weeks.  Found to have elevated blood glucose 1109, CO2 22, anion gap 20.  She was started on IV insulin with IV fluids, Ringer lactate.  At this time HHS has resolved.  CBG is consistently below 200, anion gap is 11.  Will discontinue IV insulin, start Lantus 20 units subcu daily and sliding scale insulin with NovoLog.  Will start on carb modified diet, check CBG before every meal and at bedtime. 2. New onset diabetes mellitus type 2-patient started on Lantus 20 units subcu daily, sensitive sliding scale insulin with NovoLog, will also initiate metformin 500 mg p.o. twice daily.  Hemoglobin A1c is 11.9. 3. Hypokalemia-potassium 3.3, will replace potassium and follow BMP in am. 4. Pancreatitis-CT abdomen pelvis shows peripancreatic fat stranding, lipase was elevated to 382.  Lipase has come down to 201.  Patient started on carb modified diet.  She has no nausea or vomiting.  No history of alcohol abuse, she has had cholecystectomy in the past.  Triglyceride level was 216.  We will continue to  monitor. 5. Acute kidney injury-patient presented with creatinine of 1.63, which has improved to 0.86.  Currently back to baseline, likely from dehydration due to hyperglycemia. 6. Transaminitis-patient had minimal elevation of AST and ALT, liver enzymes have significantly improved, currently close to normal.  Follow LFTs in a.m. 7. Bipolar disorder-continue alprazolam 3 mg p.o. nightly    Scheduled medications:   . ALPRAZolam  3 mg Oral QHS  . aspirin EC  81 mg Oral Q48H  . enoxaparin (LOVENOX) injection  40 mg Subcutaneous Q24H  . feeding supplement (GLUCERNA SHAKE)  237 mL Oral TID BM  . hydrocortisone cream  1 application Topical BID  . insulin aspart  0-9 Units Subcutaneous TID WC  . insulin glargine  20 Units Subcutaneous Daily         Data Reviewed:   CBG:  Recent Labs  Lab 03/12/21 0830 03/12/21 0920 03/12/21 1042 03/12/21 1152 03/12/21 1252  GLUCAP 199* 219* 185* 209* 171*    SpO2: 100 %    Vitals:   03/12/21 0747 03/12/21 0922 03/12/21 1214 03/12/21 1545  BP: 116/73  129/61 (!) 127/55  Pulse: 88  92 96  Resp: 17  18 18   Temp: 98.3 F (36.8 C)  98.7 F (37.1 C) 98.8 F (37.1 C)  TempSrc: Oral  Axillary Axillary  SpO2:   99% 100%  Weight:  89.2 kg       Intake/Output Summary (Last 24 hours) at 03/12/2021 1815 Last data filed at 03/12/2021 1400 Gross per 24 hour  Intake 2476.7 ml  Output 600 ml  Net 1876.7 ml  04/30 1901 - 05/02 0700 In: 2116.7 [P.O.:50; I.V.:2066.7] Out: 600 [Urine:600]  Filed Weights   03/12/21 0922  Weight: 89.2 kg    CBC:  Recent Labs  Lab 03/11/21 1135 03/11/21 1157  WBC 14.8*  --   HGB 15.2* 16.7*  HCT 48.3* 49.0*  PLT 396  --   MCV 105.7*  --   MCH 33.3  --   MCHC 31.5  --   RDW 13.8  --   LYMPHSABS 1.2  --   MONOABS 0.4  --   EOSABS 0.0  --   BASOSABS 0.1  --     Complete metabolic panel:  Recent Labs  Lab 03/11/21 1135 03/11/21 1157 03/11/21 1343 03/11/21 1415 03/11/21 1618 03/11/21 2222  03/12/21 0229 03/12/21 0607  NA 128*   < > 133*  --  134* 137 139 140  K 4.7   < > 4.7  --  4.3 3.6 3.2* 3.3*  CL 86*  --  93*  --  95* 99 103 100  CO2 22  --  22  --  22 26 25 29   GLUCOSE 1,109*  --  745*  --  669* 209* 198* 178*  BUN 23  --  21  --  21 15 13 13   CREATININE 1.63*  --  1.26*  --  1.35* 0.92 0.83 0.86  CALCIUM 9.6  --  9.5  --  9.4 8.7* 8.5* 8.8*  AST 62*  --   --   --   --   --   --  54*  ALT 55*  --   --   --   --   --   --  41  ALKPHOS 147*  --   --   --   --   --   --  107  BILITOT 1.0  --   --   --   --   --   --  0.7  ALBUMIN 4.1  --   --   --   --   --   --  3.1*  MG  --   --   --   --   --   --   --  2.0  HGBA1C  --   --   --  11.3*  --   --   --   --    < > = values in this interval not displayed.    Recent Labs  Lab 03/11/21 1135 03/12/21 0607  LIPASE 382* 201*    Recent Labs  Lab 03/11/21 1136  SARSCOV2NAA NEGATIVE    ------------------------------------------------------------------------------------------------------------------ Recent Labs    03/12/21 0607  CHOL 147  HDL 34*  LDLCALC 70  TRIG 216*  CHOLHDL 4.3    Lab Results  Component Value Date   HGBA1C 11.3 (H) 03/11/2021   ------------------------------------------------------------------------------------------------------------------ No results for input(s): TSH, T4TOTAL, T3FREE, THYROIDAB in the last 72 hours.  Invalid input(s): FREET3 ------------------------------------------------------------------------------------------------------------------ No results for input(s): VITAMINB12, FOLATE, FERRITIN, TIBC, IRON, RETICCTPCT in the last 72 hours.  Coagulation profile No results for input(s): INR, PROTIME in the last 168 hours. No results for input(s): DDIMER in the last 72 hours.  Cardiac Enzymes No results for input(s): CKTOTAL, CKMB, CKMBINDEX, TROPONINI in the last 168  hours.  ------------------------------------------------------------------------------------------------------------------ No results found for: BNP   Antibiotics: Anti-infectives (From admission, onward)   None       Radiology Reports  CT ABDOMEN PELVIS WO CONTRAST  Result Date: 03/11/2021 CLINICAL DATA:  Fatigue.  Dr. Andree Elk.  Abdominal discomfort. EXAM: CT ABDOMEN AND PELVIS WITHOUT CONTRAST TECHNIQUE: Multidetector CT imaging of the abdomen and pelvis was performed following the standard protocol without IV contrast. COMPARISON:  May 20, 2008 FINDINGS: Lower chest: The lung bases are clear. The heart size is normal. Hepatobiliary: There is decreased hepatic attenuation suggestive of hepatic steatosis. Status post cholecystectomy.There is no biliary ductal dilation. Pancreas: There is mild peripancreatic fat stranding. Spleen: Unremarkable. Adrenals/Urinary Tract: --Adrenal glands: Unremarkable. --Right kidney/ureter: No hydronephrosis or radiopaque kidney stones. --Left kidney/ureter: No hydronephrosis or radiopaque kidney stones. --Urinary bladder: Unremarkable. 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 --Small bowel: Unremarkable. --Colon: Unremarkable. --Appendix: Normal. Vascular/Lymphatic: Atherosclerotic calcification is present within the non-aneurysmal abdominal aorta, without hemodynamically significant stenosis. --No retroperitoneal lymphadenopathy. --No mesenteric lymphadenopathy. --No pelvic or inguinal lymphadenopathy. Reproductive: Status post hysterectomy. No adnexal mass. Other: No ascites or free air. The abdominal wall is normal. Musculoskeletal. No acute displaced fractures. IMPRESSION: 1. Peripancreatic fat stranding suggestive of acute pancreatitis. Correlation with lipase is recommended. 2. Hepatic steatosis. Aortic Atherosclerosis (ICD10-I70.0). Electronically Signed   By: Constance Holster M.D.   On: 03/11/2021 22:44   DG CHEST  PORT 1 VIEW  Result Date: 03/11/2021 CLINICAL DATA:  Weakness and cough. EXAM: PORTABLE CHEST 1 VIEW COMPARISON:  04/26/2015 FINDINGS: The cardiomediastinal silhouette is unremarkable. There is no evidence of focal airspace disease, pulmonary edema, suspicious pulmonary nodule/mass, pleural effusion, or pneumothorax. No acute bony abnormalities are identified. IMPRESSION: No active disease. Electronically Signed   By: Margarette Canada M.D.   On: 03/11/2021 15:33      DVT prophylaxis: Lovenox  Code Status: Full code  Family Communication: Discussed with patient's daughter on phone   Consultants:    Procedures:      Objective    Physical Examination:    General-appears in no acute distress  Heart-S1-S2, regular, no murmur auscultated  Lungs-clear to auscultation bilaterally, no wheezing or crackles auscultated  Abdomen-soft, nontender, no organomegaly  Extremities-no edema in the lower extremities  Neuro-alert, oriented x3, no focal deficit noted   Status is: Inpatient  Dispo: The patient is from: Home              Anticipated d/c is to: Home              Anticipated d/c date is: 03/13/2021              Patient currently not stable for discharge  Barrier to discharge-monitoring of patient's blood glucose, adjusting insulin for new onset diabetes  COVID-19 Labs  No results for input(s): DDIMER, FERRITIN, LDH, CRP in the last 72 hours.  Lab Results  Component Value Date   [place] NEGATIVE 03/11/2021   [place] NEGATIVE 05/11/2020    Microbiology  Recent Results (from the past 240 hour(s))  Urine culture     Status: None   Collection Time: 03/11/21 11:23 AM   Specimen: Urine, Random  Result Value Ref Range Status   Specimen Description URINE, RANDOM  Final   Special Requests NONE  Final   Culture   Final    NO GROWTH Performed at [facility], [address] [place], [place] [address]    Report Status 03/12/2021 FINAL  Final  Resp Panel by  RT-PCR (Flu A&B, Covid) Nasopharyngeal Swab     Status: None   Collection Time: 03/11/21 11:36 AM   Specimen: Nasopharyngeal Swab; Nasopharyngeal(NP) swabs in vial transport medium  Result Value Ref Range Status   SARS Coronavirus  2 by RT PCR NEGATIVE NEGATIVE Final    Comment: (NOTE) SARS-CoV-2 target nucleic acids are NOT DETECTED.  The SARS-CoV-2 RNA is generally detectable in upper respiratory specimens during the acute phase of infection. The lowest concentration of SARS-CoV-2 viral copies this assay can detect is 138 copies/mL. A negative result does not preclude SARS-Cov-2 infection and should not be used as the sole basis for treatment or other patient management decisions. A negative result may occur with  improper specimen collection/handling, submission of specimen other than nasopharyngeal swab, presence of viral mutation(s) within the areas targeted by this assay, and inadequate number of viral copies(<138 copies/mL). A negative result must be combined with clinical observations, patient history, and epidemiological information. The expected result is Negative.  Fact Sheet for Patients:  EntrepreneurPulse.com.au  Fact Sheet for Healthcare Providers:  IncredibleEmployment.be  This test is no t yet approved or cleared by the [place] FDA and  has been authorized for detection and/or diagnosis of SARS-CoV-2 by FDA under an Emergency Use Authorization (EUA). This EUA will remain  in effect (meaning this test can be used) for the duration of the COVID-19 declaration under Section 564(b)(1) of the Act, 21 U.S.C.section 360bbb-3(b)(1), unless the authorization is terminated  or revoked sooner.       Influenza A by PCR NEGATIVE NEGATIVE Final   Influenza B by PCR NEGATIVE NEGATIVE Final    Comment: (NOTE) The Xpert Xpress SARS-CoV-2/FLU/RSV plus assay is intended as an aid in the diagnosis of influenza from Nasopharyngeal swab  specimens and should not be used as a sole basis for treatment. Nasal washings and aspirates are unacceptable for Xpert Xpress SARS-CoV-2/FLU/RSV testing.  Fact Sheet for Patients: EntrepreneurPulse.com.au  Fact Sheet for Healthcare Providers: IncredibleEmployment.be  This test is not yet approved or cleared by the [place] FDA and has been authorized for detection and/or diagnosis of SARS-CoV-2 by FDA under an Emergency Use Authorization (EUA). This EUA will remain in effect (meaning this test can be used) for the duration of the COVID-19 declaration under Section 564(b)(1) of the Act, 21 U.S.C. section 360bbb-3(b)(1), unless the authorization is terminated or revoked.  Performed at [facility], [place] [address] [place], [place] [address]              [facility] If 7PM-7AM, please contact night-coverage at [email or web address], Office  [phone]   03/12/2021, 6:15 PM  LOS: 1 day

## 2021-03-12 NOTE — Progress Notes (Addendum)
Initial Nutrition Assessment  DOCUMENTATION CODES:   Not applicable  INTERVENTION:  Provide Glucerna Shake po TID, each supplement provides 220 kcal and 10 grams of protein  Encourage adequate PO intake.   NUTRITION DIAGNOSIS:   Increased nutrient needs related to acute illness as evidenced by estimated needs.  GOAL:   Patient will meet greater than or equal to 90% of their needs  MONITOR:   PO intake,Supplement acceptance,Labs,I & O's,Skin,Weight trends  REASON FOR ASSESSMENT:   Malnutrition Screening Tool    ASSESSMENT:   65 y.o. female with medical history significant of bipolar disorder who presents after having episode of nausea and vomiting. Pt with fatigue, polyuria, polydipsia, abdominal discomfort for the past 3 weeks. HgA1C 11.3% and was started on IV insulin. Plans to transition to lantus and novolog.   Pt unavailable during attempted time of visit. RD unable to obtain pt nutrition history at this time. Diet has just been advanced. RD to order nutritional supplements to aid in caloric and protein needs. Pt with no significant weight loss per weight records. Unable to complete Nutrition-Focused physical exam at this time.   Labs and medications reviewed. Lipase elevated at 201.  Diet Order:   Diet Order            Diet Carb Modified Fluid consistency: Thin; Room service appropriate? Yes  Diet effective now                 EDUCATION NEEDS:   Not appropriate for education at this time  Skin:  Skin Assessment: Reviewed RN Assessment  Last BM:  4/30  Height:   Ht Readings from Last 1 Encounters:  05/11/20 5\' 8"  (1.727 m)    Weight:   Wt Readings from Last 1 Encounters:  03/12/21 89.2 kg    BMI:  Body mass index is 29.89 kg/m.  Estimated Nutritional Needs:   Kcal:  1850-2050  Protein:  95-105 grams  Fluid:  >/= 1.8 L/day   Emma Parker, MS, RD, LDN RD pager number/after hours weekend pager number on Amion.

## 2021-03-12 NOTE — Progress Notes (Addendum)
Inpatient Diabetes Program Recommendations  AACE/ADA: New Consensus Statement on Inpatient Glycemic Control (2015)  Target Ranges:  Prepandial:   less than 140 mg/dL      Peak postprandial:   less than 180 mg/dL (1-2 hours)      Critically ill patients:  140 - 180 mg/dL   Lab Results  Component Value Date   GLUCAP 185 (H) 03/12/2021   HGBA1C 11.3 (H) 03/11/2021    Review of Glycemic Control  Diabetes history: none Outpatient Diabetes medications: none Current orders for Inpatient glycemic control: IV insulin  Inpatient Diabetes Program Recommendations:   Spoke with Dr. Darrick Meigs on the phone.   Recommend Lantus 20 units daily (0.3 units/kg= 26.76 units weight based) and Novolog SENSITIVE correction scale every 4 hours while NPO and then TID and add Novolog HS scale (0-5 units). Titrate dosages as needed.   Living Well with Diabetes booklet ordered. Diabetes coordinator to follow up with education. Noted that HgbA1C is 11.3%.   Will continue to monitor blood sugars while in the hospital.   Harvel Ricks RN BSN CDE Diabetes Coordinator Pager: [phone]  8am-5pm

## 2021-03-12 NOTE — Progress Notes (Addendum)
Inpatient Diabetes Program Recommendations  AACE/ADA: New Consensus Statement on Inpatient Glycemic Control (2015)  Target Ranges:  Prepandial:   less than 140 mg/dL      Peak postprandial:   less than 180 mg/dL (1-2 hours)      Critically ill patients:  140 - 180 mg/dL   Lab Results  Component Value Date   GLUCAP 199 (H) 03/12/2021    Review of Glycemic Control  Diabetes history: no  Current orders for Inpatient glycemic control: IV insulin  Gtt rates 2.5-11 units/hour  Inpatient Diabetes Program Recommendations:    -  Need current weight to dose insulin. Called nursing staff.  - keep pt on iv insulin until 4 glucose readings between 140-180.  At time of transition consider: -  Transition pt on 0.3-0.4 unit/kg of basal insulin -   Novolog 0-15 units tid + hs  Will order education material and speak with pt this admission. New to insulin. Will need to instruct on insulin pen administration.  Addendum 303 pm: Spoke with patient about new diabetes diagnosis.  Discussed A1C results (11.3%) and explained what an A1C is. Discussed basic pathophysiology of DM Type 2, basic home care, importance of checking CBGs and maintaining good CBG control to prevent long-term and short-term complications. Reviewed glucose and A1C goals. Reviewed signs and symptoms of hyperglycemia and hypoglycemia along with treatment for both.  Discussed impact of nutrition, exercise, stress, sickness, and medications on diabetes control. Reviewed Living Well with diabetes booklet and encouraged patient to read through entire book. Informed patient that she will be prescribed basal insulin in addition to Metformin at time of d/c. discussed when and how to take both.  Asked patient to check her glucose 4 times per day (before meals and at bedtime) and to keep a log book of glucose readings and insulin taken. Explained how the doctor he follows up with can use the log book to continue to make insulin adjustments if  needed. Reviewed and demonstrated how to administer insulin with vial and syringe and insulin pen.   Patient verbalized understanding of information discussed and has no further questions at this time related to diabetes.   RNs to provide ongoing basic DM education at bedside with this patient and engage patient to actively check blood glucose and administer insulin injections.    Levemir seems to be preferred with her insurance  -  Levemir flex pen order # [id number] -  Insulin pen needles order # [id number] -  Glucose meter kit order #[id number] -  Ambulatory for outpt Diabetes education   Thanks,  Tama Headings RN, MSN, BC-ADM Inpatient Diabetes Coordinator Team Pager [phone] (8a-5p)

## 2021-03-13 DIAGNOSIS — E119 Type 2 diabetes mellitus without complications: Secondary | ICD-10-CM

## 2021-03-13 LAB — CBC
HCT: 38.2 % (ref 36.0–46.0)
Hemoglobin: 12.5 g/dL (ref 12.0–15.0)
MCH: 32.9 pg (ref 26.0–34.0)
MCHC: 32.7 g/dL (ref 30.0–36.0)
MCV: 100.5 fL — ABNORMAL HIGH (ref 80.0–100.0)
Platelets: 241 10*3/uL (ref 150–400)
RBC: 3.8 MIL/uL — ABNORMAL LOW (ref 3.87–5.11)
RDW: 14.1 % (ref 11.5–15.5)
WBC: 11.9 10*3/uL — ABNORMAL HIGH (ref 4.0–10.5)
nRBC: 0 % (ref 0.0–0.2)

## 2021-03-13 LAB — COMPREHENSIVE METABOLIC PANEL
ALT: 33 U/L (ref 0–44)
AST: 42 U/L — ABNORMAL HIGH (ref 15–41)
Albumin: 2.6 g/dL — ABNORMAL LOW (ref 3.5–5.0)
Alkaline Phosphatase: 99 U/L (ref 38–126)
Anion gap: 7 (ref 5–15)
BUN: 11 mg/dL (ref 8–23)
CO2: 24 mmol/L (ref 22–32)
Calcium: 8.1 mg/dL — ABNORMAL LOW (ref 8.9–10.3)
Chloride: 101 mmol/L (ref 98–111)
Creatinine, Ser: 0.86 mg/dL (ref 0.44–1.00)
GFR, Estimated: >60 mL/min (ref >60)
Glucose, Bld: 358 mg/dL — ABNORMAL HIGH (ref 70–99)
Potassium: 4.4 mmol/L (ref 3.5–5.1)
Sodium: 132 mmol/L — ABNORMAL LOW (ref 135–145)
Total Bilirubin: 0.4 mg/dL (ref 0.3–1.2)
Total Protein: 6.1 g/dL — ABNORMAL LOW (ref 6.5–8.1)

## 2021-03-13 LAB — GLUCOSE, CAPILLARY
Glucose-Capillary: 345 mg/dL — ABNORMAL HIGH (ref 70–99)
Glucose-Capillary: 344 mg/dL — ABNORMAL HIGH (ref 70–99)
Glucose-Capillary: 306 mg/dL — ABNORMAL HIGH (ref 70–99)

## 2021-03-13 MED ORDER — INSULIN GLARGINE 100 UNIT/ML ~~LOC~~ SOLN: 25.0000 [IU] | Freq: Every day | SUBCUTANEOUS | Status: DC

## 2021-03-13 MED ORDER — BLOOD GLUCOSE MONITOR KIT: PACK | 0 refills | Status: AC

## 2021-03-13 MED ORDER — METFORMIN HCL 500 MG PO TABS: 500.0000 mg | ORAL_TABLET | Freq: Two times a day (BID) | ORAL | 2 refills | Status: DC

## 2021-03-13 MED ORDER — INSULIN GLARGINE 100 UNIT/ML ~~LOC~~ SOLN
10.0000 [IU] | Freq: Once | SUBCUTANEOUS | Status: AC
  Administered 2021-03-13: 10 [IU] via SUBCUTANEOUS
  Filled 2021-03-13: qty 0.1

## 2021-03-13 MED ORDER — INSULIN GLARGINE 100 UNIT/ML ~~LOC~~ SOLN
5.0000 [IU] | Freq: Once | SUBCUTANEOUS | Status: AC
  Administered 2021-03-13: 5 [IU] via SUBCUTANEOUS
  Filled 2021-03-13: qty 0.05

## 2021-03-13 MED ORDER — INSULIN GLARGINE 100 UNIT/ML SOLOSTAR PEN: 35.0000 [IU] | PEN_INJECTOR | Freq: Every day | SUBCUTANEOUS | 2 refills | Status: DC

## 2021-03-13 NOTE — Discharge Summary (Signed)
Physician Discharge Summary  [name] MLJ:[id number] DOB: 10/27/1956 DOA: 03/11/2021  PCP: Bernerd Limbo, MD  Admit date: 03/11/2021 Discharge date: 03/13/2021  Time spent: 50 minutes  Recommendations for Outpatient Follow-up:  1. Follow-up PCP in 1 week 2. Follow-up with GI in 2 months   Discharge Diagnoses:  Principal Problem:   Hyperosmolar hyperglycemic state (HHS) ([place]) Active Problems:   Bipolar I disorder ([place])   Metabolic acidosis with increased anion gap and accumulation of organic acids   Leukocytosis   Polycythemia   Transaminitis   AKI (acute kidney injury) ([place])   Discharge Condition: Stable  Diet recommendation: Carb modified diet  Filed Weights   03/12/21 0922 03/13/21 0415  Weight: 89.2 kg (P) 91.4 kg    History of present illness:  65 year old female with medical history of anxiety, depression, bipolar disorder who presented with episodes of nausea and vomiting.  Patient has not been feeling well for past 3 weeks.  Complains of having fatigue, polyuria, polydipsia, dry mouth, change in taste and abdominal discomfort.  In the ED she was found to have blood glucose of 11/12/2007, anion gap 20.  Lipase was elevated to 382.  CT abdomen/pelvis without contrast showed peripancreatic fat stranding suggestive of acute pancreatitis.  Hepatic steatosis.   Hospital Course:  1. Hyperosmolar hyperglycemic state-patient presented with polyuria, polydipsia for past 3 weeks.  Found to have elevated blood glucose 1109, CO2 22, anion gap 20.  She was started on IV insulin with IV fluids, Ringer lactate.  At this time HHS has resolved.  CBG is consistently below 200, anion gap is 11.  IV insulin was discontinued and patient started on NovoLog sliding scale and Lantus 20 units subcu daily.  Due to significantly elevated CBG in 400s last night, Lantus dose will be increased to 35 units subcu daily.  CBG around 300.  2. New onset diabetes mellitus type 2-patient was started on  Lantus 21 subcu daily, sensitive sliding scale NovoLog.  Hemoglobin A1c is 11.9.  Will discharge on Lantus 35 units subcu daily, metformin 500 mg p.o. twice daily.   3. Hypokalemia-.  Replete  4. Pancreatitis-CT abdomen pelvis shows peripancreatic fat stranding, lipase was elevated to 382.  Lipase has come down to 201.  Patient started on carb modified diet.  She has no nausea or vomiting.  No history of alcohol abuse, she has had cholecystectomy in the past.  Triglyceride level was 216.    Gastroenterology was consulted, no further intervention.  They will follow-up as outpatient for MRI abdomen.  5. Acute kidney injury-patient presented with creatinine of 1.63, which has improved to 0.86.  Currently back to baseline, likely from dehydration due to hyperglycemia.  6. Transaminitis-patient had minimal elevation of AST and ALT, liver enzymes have significantly improved, currently close to normal.    7. Bipolar disorder-continue alprazolam 3 mg p.o. nightly   Procedures:    Consultations:  Gastroenterology  Discharge Exam: Vitals:   03/12/21 2325 03/13/21 0438  BP: 105/64 117/61  Pulse: 91 91  Resp: 15 19  Temp: 98.4 F (36.9 C) 98 F (36.7 C)  SpO2: 90% 96%    General: Appears in no acute distress Cardiovascular: S1-S2, regular Respiratory: Clear to auscultation bilaterally  Discharge Instructions   Discharge Instructions    Diet - low sodium heart healthy   Complete by: As directed    Increase activity slowly   Complete by: As directed      Allergies as of 03/13/2021  Reactions   Codeine Nausea Only   Diphenhydramine Hcl Other (See Comments)   Makes heart race   Nasal Decongestant [pseudoephedrine] Other (See Comments)   Nervousness, insomnia      Medication List    TAKE these medications   alprazolam 2 MG tablet Commonly known as: XANAX Take 2 mg by mouth See admin instructions. Take 1 1/2 tablets (3 mg) by mouth daily at bedtime, may also take 1/2  tablet (1 mg) during the day as needed for anxiety   aspirin EC 81 MG tablet Take 81 mg by mouth See admin instructions. Take one tablet (81 mg) by mouth every other night.  Swallow whole.   blood glucose meter kit and supplies Kit Dispense based on patient and insurance preference. Use up to four times daily as directed.   clobetasol ointment 0.05 % Commonly known as: TEMOVATE Apply 1 application topically 2 (two) times daily as needed (itching/psoriasis).   HYDROcodone-acetaminophen 5-325 MG tablet Commonly known as: NORCO/VICODIN Take 1 tablet by mouth 2 (two) times daily as needed (pain).   hydrocortisone 2.5 % cream Apply 1 application topically 2 (two) times daily. Apply to face   insulin glargine 100 UNIT/ML Solostar Pen Commonly known as: LANTUS Inject 35 Units into the skin daily. Start taking on: Mar 14, 2021   metFORMIN 500 MG tablet Commonly known as: GLUCOPHAGE Take 1 tablet (500 mg total) by mouth 2 (two) times daily with a meal.   nystatin 100000 UNIT/ML suspension Commonly known as: MYCOSTATIN Take 5 mLs by mouth 4 (four) times daily. Swish and spit   polyethylene glycol 17 g packet Commonly known as: MIRALAX / GLYCOLAX Take 17 g by mouth 2 (two) times daily. Mix in 8 oz liquid and drink   polyvinyl alcohol 1.4 % ophthalmic solution Commonly known as: LIQUIFILM TEARS Place 1 drop into the left eye 2 (two) times daily as needed for dry eyes.   triamcinolone ointment 0.1 % Commonly known as: KENALOG Apply 1 application topically 2 (two) times daily as needed (itching/psoriasis).   Vitamin D (Ergocalciferol) 1.25 MG (50000 UNIT) Caps capsule Commonly known as: DRISDOL Take 50,000 Units by mouth every Wednesday.      Allergies  Allergen Reactions  . Codeine Nausea Only  . Diphenhydramine Hcl Other (See Comments)    Makes heart race  . Nasal Decongestant [Pseudoephedrine] Other (See Comments)    Nervousness, insomnia    Follow-up Information     Bernerd Limbo, MD Follow up in 1 week(s).   Specialty: Family Medicine Contact information: [place] [address] [phone]        Clarene Essex, MD Follow up in 2 month(s).   Specialty: Gastroenterology Contact information: [address] [place] [address] [phone]                The results of significant diagnostics from this hospitalization (including imaging, microbiology, ancillary and laboratory) are listed below for reference.    Significant Diagnostic Studies: CT ABDOMEN PELVIS WO CONTRAST  Result Date: 03/11/2021 CLINICAL DATA:  Fatigue.  Dr. Andree Elk.  Abdominal discomfort. EXAM: CT ABDOMEN AND PELVIS WITHOUT CONTRAST TECHNIQUE: Multidetector CT imaging of the abdomen and pelvis was performed following the standard protocol without IV contrast. COMPARISON:  May 20, 2008 FINDINGS: Lower chest: The lung bases are clear. The heart size is normal. Hepatobiliary: There is decreased hepatic attenuation suggestive of hepatic steatosis. Status post cholecystectomy.There is no biliary ductal dilation. Pancreas: There is  mild peripancreatic fat stranding. Spleen: Unremarkable. Adrenals/Urinary Tract: --Adrenal glands: Unremarkable. --Right kidney/ureter: No hydronephrosis or radiopaque kidney stones. --Left kidney/ureter: No hydronephrosis or radiopaque kidney stones. --Urinary bladder: Unremarkable. 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 --Small bowel: Unremarkable. --Colon: Unremarkable. --Appendix: Normal. Vascular/Lymphatic: Atherosclerotic calcification is present within the non-aneurysmal abdominal aorta, without hemodynamically significant stenosis. --No retroperitoneal lymphadenopathy. --No mesenteric lymphadenopathy. --No pelvic or inguinal lymphadenopathy. Reproductive: Status post hysterectomy. No adnexal mass. Other: No ascites or free air. The abdominal wall is normal.  Musculoskeletal. No acute displaced fractures. IMPRESSION: 1. Peripancreatic fat stranding suggestive of acute pancreatitis. Correlation with lipase is recommended. 2. Hepatic steatosis. Aortic Atherosclerosis (ICD10-I70.0). Electronically Signed   By: Constance Holster M.D.   On: 03/11/2021 22:44   DG CHEST PORT 1 VIEW  Result Date: 03/11/2021 CLINICAL DATA:  Weakness and cough. EXAM: PORTABLE CHEST 1 VIEW COMPARISON:  04/26/2015 FINDINGS: The cardiomediastinal silhouette is unremarkable. There is no evidence of focal airspace disease, pulmonary edema, suspicious pulmonary nodule/mass, pleural effusion, or pneumothorax. No acute bony abnormalities are identified. IMPRESSION: No active disease. Electronically Signed   By: Margarette Canada M.D.   On: 03/11/2021 15:33    Microbiology: Recent Results (from the past 240 hour(s))  Urine culture     Status: None   Collection Time: 03/11/21 11:23 AM   Specimen: Urine, Random  Result Value Ref Range Status   Specimen Description URINE, RANDOM  Final   Special Requests NONE  Final   Culture   Final    NO GROWTH Performed at [facility], [address] [place], [place] [address]    Report Status 03/12/2021 FINAL  Final  Resp Panel by RT-PCR (Flu A&B, Covid) Nasopharyngeal Swab     Status: None   Collection Time: 03/11/21 11:36 AM   Specimen: Nasopharyngeal Swab; Nasopharyngeal(NP) swabs in vial transport medium  Result Value Ref Range Status   SARS Coronavirus 2 by RT PCR NEGATIVE NEGATIVE Final    Comment: (NOTE) SARS-CoV-2 target nucleic acids are NOT DETECTED.  The SARS-CoV-2 RNA is generally detectable in upper respiratory specimens during the acute phase of infection. The lowest concentration of SARS-CoV-2 viral copies this assay can detect is 138 copies/mL. A negative result does not preclude SARS-Cov-2 infection and should not be used as the sole basis for treatment or other patient management decisions. A negative result may occur  with  improper specimen collection/handling, submission of specimen other than nasopharyngeal swab, presence of viral mutation(s) within the areas targeted by this assay, and inadequate number of viral copies(<138 copies/mL). A negative result must be combined with clinical observations, patient history, and epidemiological information. The expected result is Negative.  Fact Sheet for Patients:  EntrepreneurPulse.com.au  Fact Sheet for Healthcare Providers:  IncredibleEmployment.be  This test is no t yet approved or cleared by the [place] FDA and  has been authorized for detection and/or diagnosis of SARS-CoV-2 by FDA under an Emergency Use Authorization (EUA). This EUA will remain  in effect (meaning this test can be used) for the duration of the COVID-19 declaration under Section 564(b)(1) of the Act, 21 U.S.C.section 360bbb-3(b)(1), unless the authorization is terminated  or revoked sooner.       Influenza A by PCR NEGATIVE NEGATIVE Final   Influenza B by PCR NEGATIVE NEGATIVE Final    Comment: (NOTE) The Xpert Xpress SARS-CoV-2/FLU/RSV plus assay is intended as an aid in the diagnosis of influenza from Nasopharyngeal swab specimens and should not be used as a  sole basis for treatment. Nasal washings and aspirates are unacceptable for Xpert Xpress SARS-CoV-2/FLU/RSV testing.  Fact Sheet for Patients: EntrepreneurPulse.com.au  Fact Sheet for Healthcare Providers: IncredibleEmployment.be  This test is not yet approved or cleared by the [place] FDA and has been authorized for detection and/or diagnosis of SARS-CoV-2 by FDA under an Emergency Use Authorization (EUA). This EUA will remain in effect (meaning this test can be used) for the duration of the COVID-19 declaration under Section 564(b)(1) of the Act, 21 U.S.C. section 360bbb-3(b)(1), unless the authorization is terminated  or revoked.  Performed at [facility], [place] [address] [place], [place] [address]      Labs: Basic Metabolic Panel: Recent Labs  Lab 03/11/21 1618 03/11/21 2222 03/12/21 0229 03/12/21 0607 03/13/21 0300  NA 134* 137 139 140 132*  K 4.3 3.6 3.2* 3.3* 4.4  CL 95* 99 103 100 101  CO2 $Re'22 26 25 29 24  'MXF$ GLUCOSE 669* 209* 198* 178* 358*  BUN $Re'21 15 13 13 11  'ZjB$ CREATININE 1.35* 0.92 0.83 0.86 0.86  CALCIUM 9.4 8.7* 8.5* 8.8* 8.1*  MG  --   --   --  2.0  --    Liver Function Tests: Recent Labs  Lab 03/11/21 1135 03/12/21 0607 03/13/21 0300  AST 62* 54* 42*  ALT 55* 41 33  ALKPHOS 147* 107 99  BILITOT 1.0 0.7 0.4  PROT 9.1* 7.1 6.1*  ALBUMIN 4.1 3.1* 2.6*   Recent Labs  Lab 03/11/21 1135 03/12/21 0607  LIPASE 382* 201*   No results for input(s): AMMONIA in the last 168 hours. CBC: Recent Labs  Lab 03/11/21 1135 03/11/21 1157 03/13/21 0300  WBC 14.8*  --  11.9*  NEUTROABS 13.0*  --   --   HGB 15.2* 16.7* 12.5  HCT 48.3* 49.0* 38.2  MCV 105.7*  --  100.5*  PLT 396  --  241    CBG: Recent Labs  Lab 03/12/21 1252 03/12/21 1711 03/12/21 2119 03/13/21 0742 03/13/21 1201  GLUCAP 171* 345* 397* 344* 306*       Signed:  Oswald Hillock MD.  [facility] 03/13/2021, 2:47 PM

## 2021-03-13 NOTE — Consult Note (Signed)
Referring Provider: Dr. Eleonore Chiquito Primary Care Physician:  Bernerd Limbo, MD Primary Gastroenterologist:  Dr. Watt Climes [facility])  Reason for Consultation:  Acute pancreatitis  HPI: [name] is a 65 y.o. female with history of hepatic steatosis and remote cholecystectomy presenting for consultation of acute pancreatitis.  Patient presented to the ED on 5/1 with nausea and vomiting (she has not had any nausea/vomiting since 5/1 per her report).  She reports feeling fatigued and having a decreased appetite for 1-2 weeks prior to presentation.  She also reports epigastric abdominal discomfort for several days.  Denies recent fever.  CT 5/1 revealed acute pancreatitis.  Lipase was elevated to 382 on arrival.  She denies any alcohol use, any new medications, or prior episodes of pancreatitis.  he reports some dysphagia to pills and discomfort in her chest that has been ongoing for weeks to months.  Reports approximately 10 pound weight loss in the last 2 weeks.  Denies any changes in bowel habits, denies diarrhea, melena, or hematochezia.  Patient was also found to be hyperglycemic on arrival with glycose of 1109.  Past Medical History:  Diagnosis Date  . Anxiety   . Bipolar disorder ([place])   . Chest pain    Myocardial Perfusion Study 02/08/02 - Abnormal study suggesting ischemia in anterior to anteroseptal wall to mid to upper mid ventricular region w/associated hypocontractibility. Clinical correlation is recommended. EF=46% (Dr. Corky Downs)  . Cystic mesothelioma    LARGE MESOTHELIAL CYST  . Decreased pulse    Lower arterial duplex scan - 02/18/08 - NORMAL  . Depression   . 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 Mental disorder   . Ovarian neoplasm    LARGE OVARIAN NEOPLASM  . Palpitations    Cardiac CTA 03/31/08 - mild cardiomegaly. Small amount of pericardial fluid in the pericardial recess adjacent to the ascending aorta  . Rectocele, female   . SUI (stress urinary incontinence, female)   .  Tachycardia     Past Surgical History:  Procedure Laterality Date  . ABDOMINAL HYSTERECTOMY  1982   TAH  . ANKLE SURGERY Right 1998  . BACK SURGERY    . CARDIAC CATHETERIZATION  03/15/02   No evidence of CAD  . CHOLECYSTECTOMY  1998  . KNEE SURGERY Right 1985  . LAMINECTOMY    . OOPHORECTOMY Left 1998   LSO - Benign tumor  . [place]    Prior to Admission medications   Medication Sig Start Date End Date Taking? Authorizing Provider  alprazolam Duanne Moron) 2 MG tablet Take 2 mg by mouth See admin instructions. Take 1 1/2 tablets (3 mg) by mouth daily at bedtime, may also take 1/2 tablet (1 mg) during the day as needed for anxiety   Yes [provider]  aspirin EC 81 MG tablet Take 81 mg by mouth See admin instructions. Take one tablet (81 mg) by mouth every other night.  Swallow whole.   Yes [provider]  clobetasol ointment (TEMOVATE) 2.68 % Apply 1 application topically 2 (two) times daily as needed (itching/psoriasis). 11/22/20  Yes [provider]  HYDROcodone-acetaminophen (NORCO) 5-325 MG per tablet Take 1 tablet by mouth 2 (two) times daily as needed (pain).   Yes [provider]  hydrocortisone 2.5 % cream Apply 1 application topically 2 (two) times daily. Apply to face 02/16/19  Yes [provider]  nystatin (MYCOSTATIN) 100000 UNIT/ML suspension Take 5 mLs by mouth 4 (four) times daily. Swish and spit 03/09/21  Yes  [provider]  polyethylene glycol (MIRALAX / GLYCOLAX) 17 g packet Take 17 g by mouth 2 (two) times daily. Mix in 8 oz liquid and drink   Yes [provider]  polyvinyl alcohol (LIQUIFILM TEARS) 1.4 % ophthalmic solution Place 1 drop into the left eye 2 (two) times daily as needed for dry eyes.   Yes [provider]  triamcinolone ointment (KENALOG) 0.1 % Apply 1 application topically 2 (two) times daily as needed (itching/psoriasis). 09/30/16  Yes [provider]  Vitamin D,  Ergocalciferol, (DRISDOL) 1.25 MG (50000 UNIT) CAPS capsule Take 50,000 Units by mouth every Wednesday.   Yes [provider]    Scheduled Meds: . ALPRAZolam  3 mg Oral QHS  . aspirin EC  81 mg Oral Q48H  . enoxaparin (LOVENOX) injection  40 mg Subcutaneous Q24H  . feeding supplement (GLUCERNA SHAKE)  237 mL Oral TID BM  . hydrocortisone cream  1 application Topical BID  . insulin aspart  0-9 Units Subcutaneous TID AC & HS  . [START ON 03/14/2021] insulin glargine  25 Units Subcutaneous Daily  . metFORMIN  500 mg Oral BID WC   Continuous Infusions: . sodium chloride 75 mL/hr at 03/13/21 0311  . lactated ringers Stopped (03/11/21 2248)   PRN Meds:.ALPRAZolam, clobetasol ointment, dextrose, HYDROcodone-acetaminophen, polyvinyl alcohol, triamcinolone ointment  Allergies as of 03/11/2021 - Review Complete 03/11/2021  Allergen Reaction Noted  . Codeine Nausea Only 06/08/2008  . Diphenhydramine hcl Other (See Comments) 06/08/2008  . Nasal decongestant [pseudoephedrine] Other (See Comments) 05/11/2020    Family History  Problem Relation Age of Onset  . Diabetes Paternal Grandmother 26  . Cancer Paternal Grandmother 64  . Heart disease Paternal Grandfather   . Heart attack Paternal Grandfather 28  . Cancer Maternal Grandmother 11  . Heart attack Maternal Grandfather 58  . Heart defect Child   . Diabetes Father   . Heart disease Father   . Heart attack Father   . Lymphoma Maternal Uncle   . Rheumatic fever Maternal Uncle   . Heart attack Cousin 65    Social History   Socioeconomic History  . Marital status: Married    Spouse name: Not on file  . Number of children: Not on file  . Years of education: Not on file  . Highest education level: Not on file  Occupational History  . Not on file  Tobacco Use  . Smoking status: Former Smoker    Packs/day: 1.00    Years: 23.00    Pack years: 23.00    Types: Cigarettes  . Smokeless tobacco: Never Used  Vaping Use  .  Vaping Use: Every day  . Substances: Nicotine  Substance and Sexual Activity  . Alcohol use: Yes    Alcohol/week: 0.0 standard drinks    Comment: occ  . Drug use: No  . Sexual activity: Yes    Birth control/protection: Post-menopausal    Comment: 1st intercourse 65 yo-More than 5 partners  Other Topics Concern  . Not on file  Social History Narrative  . Not on file   Social Determinants of Health   Financial Resource Strain: Not on file  Food Insecurity: Not on file  Transportation Needs: Not on file  Physical Activity: Not on file  Stress: Not on file  Social Connections: Not on file  Intimate Partner Violence: Not on file    Review of Systems: Review of Systems  Constitutional: Positive for malaise/fatigue and weight loss. Negative for chills and fever.  HENT: Negative for hearing loss and tinnitus.   Eyes: Negative for pain and redness.  Respiratory: Negative for cough and shortness of breath.   Cardiovascular: Negative for chest pain and palpitations.  Gastrointestinal: Positive for abdominal pain. Negative for blood in stool, constipation, diarrhea, heartburn, melena, nausea and vomiting.  Genitourinary: Negative for flank pain and hematuria.  Musculoskeletal: Negative for falls and joint pain.  Skin: Negative for itching and rash.  Neurological: Negative for seizures and loss of consciousness.  Endo/Heme/Allergies: Positive for polydipsia. Does not bruise/bleed easily.  Psychiatric/Behavioral: Negative for substance abuse. The patient is not nervous/anxious.      Physical Exam: Vital signs: Vitals:   03/12/21 2325 03/13/21 0438  BP: 105/64 117/61  Pulse: 91 91  Resp: 15 19  Temp: 98.4 F (36.9 C) 98 F (36.7 C)  SpO2: 90% 96%   Last BM Date: 03/12/21  Physical Exam Vitals reviewed.  Constitutional:      General: She is not in acute distress. HENT:     Head: Normocephalic and atraumatic.     Nose: Nose normal. No congestion.     Mouth/Throat:      Mouth: Mucous membranes are moist.     Pharynx: Oropharynx is clear.  Eyes:     General: No scleral icterus.    Extraocular Movements: Extraocular movements intact.     Conjunctiva/sclera: Conjunctivae normal.  Cardiovascular:     Rate and Rhythm: Normal rate and regular rhythm.     Heart sounds: Normal heart sounds.  Pulmonary:     Effort: Pulmonary effort is normal. No respiratory distress.     Breath sounds: Normal breath sounds.  Abdominal:     General: Bowel sounds are normal. There is no distension.     Palpations: Abdomen is soft. There is no mass.     Tenderness: There is abdominal tenderness (mild epigastric). There is no guarding or rebound.     Hernia: No hernia is present.  Musculoskeletal:        General: No swelling or tenderness.     Cervical back: Normal range of motion and neck supple.  Skin:    General: Skin is warm and dry.  Neurological:     General: No focal deficit present.     Mental Status: She is alert and oriented to person, place, and time.  Psychiatric:        Mood and Affect: Mood normal.        Behavior: Behavior normal. Behavior is cooperative.      GI:  Lab Results: Recent Labs    03/11/21 1135 03/11/21 1157 03/13/21 0300  WBC 14.8*  --  11.9*  HGB 15.2* 16.7* 12.5  HCT 48.3* 49.0* 38.2  PLT 396  --  241   BMET Recent Labs    03/12/21 0229 03/12/21 0607 03/13/21 0300  NA 139 140 132*  K 3.2* 3.3* 4.4  CL 103 100 101  CO2 25 29 24   GLUCOSE 198* 178* 358*  BUN 13 13 11   CREATININE 0.83 0.86 0.86  CALCIUM 8.5* 8.8* 8.1*   LFT Recent Labs    03/12/21 0607 03/13/21 0300  PROT 7.1 6.1*  ALBUMIN 3.1* 2.6*  AST 54* 42*  ALT 41 33  ALKPHOS 107 99  BILITOT 0.7 0.4  BILIDIR 0.1  --   IBILI 0.6  --    PT/INR No results for input(s): LABPROT, INR in the last 72 hours.   Studies/Results: CT ABDOMEN PELVIS WO CONTRAST  Result Date: 03/11/2021 CLINICAL  DATA:  Fatigue.  Dr. Andree Elk.  Abdominal discomfort. EXAM: CT ABDOMEN  AND PELVIS WITHOUT CONTRAST TECHNIQUE: Multidetector CT imaging of the abdomen and pelvis was performed following the standard protocol without IV contrast. COMPARISON:  May 20, 2008 FINDINGS: Lower chest: The lung bases are clear. The heart size is normal. Hepatobiliary: There is decreased hepatic attenuation suggestive of hepatic steatosis. Status post cholecystectomy.There is no biliary ductal dilation. Pancreas: There is mild peripancreatic fat stranding. Spleen: Unremarkable. Adrenals/Urinary Tract: --Adrenal glands: Unremarkable. --Right kidney/ureter: No hydronephrosis or radiopaque kidney stones. --Left kidney/ureter: No hydronephrosis or radiopaque kidney stones. --Urinary bladder: Unremarkable. 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 --Small bowel: Unremarkable. --Colon: Unremarkable. --Appendix: Normal. Vascular/Lymphatic: Atherosclerotic calcification is present within the non-aneurysmal abdominal aorta, without hemodynamically significant stenosis. --No retroperitoneal lymphadenopathy. --No mesenteric lymphadenopathy. --No pelvic or inguinal lymphadenopathy. Reproductive: Status post hysterectomy. No adnexal mass. Other: No ascites or free air. The abdominal wall is normal. Musculoskeletal. No acute displaced fractures. IMPRESSION: 1. Peripancreatic fat stranding suggestive of acute pancreatitis. Correlation with lipase is recommended. 2. Hepatic steatosis. Aortic Atherosclerosis (ICD10-I70.0). Electronically Signed   By: Constance Holster M.D.   On: 03/11/2021 22:44   DG CHEST PORT 1 VIEW  Result Date: 03/11/2021 CLINICAL DATA:  Weakness and cough. EXAM: PORTABLE CHEST 1 VIEW COMPARISON:  04/26/2015 FINDINGS: The cardiomediastinal silhouette is unremarkable. There is no evidence of focal airspace disease, pulmonary edema, suspicious pulmonary nodule/mass, pleural effusion, or pneumothorax. No acute bony abnormalities are identified. IMPRESSION: No active  disease. Electronically Signed   By: Margarette Canada M.D.   On: 03/11/2021 15:33    Impression: Acute pancreatitis. Unknown etiology.  Triglycerides mildly elevated to 216.  She has had remote cholecystectomy, no biliary dilation on CT.  No alcohol use.  No new medications. -Patient clinically improving and is tolerating a regular diet. -Normal renal function: BUN 11/ Cr 0.86 -Mildly elevated AST (42) with normal ALT (33), ALP (99), and T bili (0.4), improved from admission T. Bili 1.0/ AST 62/ ALT 55/ ALP 147  Plan:  Continue supportive care.   Consider outpatient MRI/MRCP to rule out biliary etiology.    Follow up in office with Dr. Watt Climes in next 1-2 months, can also discuss possible EGD at time of upcoming colonoscopy.    [facility] will sign off.  Please contact us if we can be of any further assistance during this hospital stay.   LOS: 2 days   Salley Slaughter  PA-C 03/13/2021, 12:21 PM  Contact #  [phone]

## 2021-03-13 NOTE — Progress Notes (Signed)
Pt awakened by phlebotomy.  No c/o at this time.  Pt education videos reviewed and currently watching how to check her glucose.  Patient also has [facility] Diabetes at bedside.

## 2021-03-13 NOTE — Progress Notes (Signed)
Inpatient Diabetes Program Recommendations  AACE/ADA: New Consensus Statement on Inpatient Glycemic Control (2015)  Target Ranges:  Prepandial:   less than 140 mg/dL      Peak postprandial:   less than 180 mg/dL (1-2 hours)      Critically ill patients:  140 - 180 mg/dL   Lab Results  Component Value Date   GLUCAP 344 (H) 03/13/2021   HGBA1C 11.3 (H) 03/11/2021    Review of Glycemic Control Results for BIJAL, SIGLIN (MRN [id number]) as of 03/13/2021 11:55  Ref. Range 03/12/2021 12:52 03/12/2021 17:11 03/12/2021 21:19 03/13/2021 07:42  Glucose-Capillary Latest Ref Range: 70 - 99 mg/dL 171 (H) 345 (H) 397 (H) 344 (H)   Diabetes history: New Diagnosis this admission  Current orders for Inpatient glycemic control:  Lantus 25 units Novolog   Pt was on 6 units/hour on IV insulin will require large dose of basal insulin  Inpatient Diabetes Program Recommendations:   Note lantus increased to 25 units. Glucose 300's. Based on IV insulin gtt rates and current glucose trends consider:  -  Increase Lantus to 35 units  Thanks,  Tama Headings RN, MSN, BC-ADM Inpatient Diabetes Coordinator Team Pager [phone] (8a-5p)

## 2021-03-13 NOTE — Progress Notes (Signed)
Late Entry:  Pt CBG 397 at HS.  No HS coverage ordered. Notified Dr. Marlyce Huge and new orders received.  Correction insulin per orders given.  Will continue to monitor pt closely.

## 2021-03-28 ENCOUNTER — Other Ambulatory Visit: Payer: Self-pay | Admitting: Family Medicine

## 2021-03-28 DIAGNOSIS — Z1231 Encounter for screening mammogram for malignant neoplasm of breast: Secondary | ICD-10-CM

## 2021-04-19 ENCOUNTER — Encounter: Payer: Medicare Other | Attending: Family Medicine | Admitting: Dietician

## 2021-04-19 ENCOUNTER — Encounter: Payer: Self-pay | Admitting: Dietician

## 2021-04-19 ENCOUNTER — Other Ambulatory Visit: Payer: Self-pay

## 2021-04-19 DIAGNOSIS — E1169 Type 2 diabetes mellitus with other specified complication: Secondary | ICD-10-CM | POA: Diagnosis present

## 2021-04-19 DIAGNOSIS — E119 Type 2 diabetes mellitus without complications: Secondary | ICD-10-CM | POA: Insufficient documentation

## 2021-04-19 NOTE — Progress Notes (Signed)
Patient was seen on 04/19/2021 for the first of a series of three diabetes self-management courses at [facility].  Patient Education Plan per assessed needs and concerns is to attend three course education program for Diabetes Self Management Education.  The following learning objectives were met by the patient during this class: Describe diabetes, types of diabetes and pathophysiology State some common risk factors for diabetes Defines the role of glucose and insulin Describe the relationship between diabetes and cardiovascular and other risks State the members of the Healthcare Team States the rationale for glucose monitoring and when to test State their individual Target Range State the importance of logging glucose readings and how to interpret the readings Identifies A1C target Explain the correlation between A1c and eAG values State symptoms and treatment of high blood glucose and low blood glucose Explain proper technique for glucose testing and identify proper sharps disposal  Handouts given during class include: How to Thrive:  A Guide for Your Journey with Diabetes by the ADA Meal Plan Card and carbohydrate content list Dietary intake form Low Sodium Flavoring Tips Types of Fats Dining Out Label reading Snack list Planning a balanced meal The diabetes portion plate Diabetes Resources A1c to eAG Conversion Chart Blood Glucose Log Diabetes Recommended Care Schedule Support Group Diabetes Success Plan Core Class Satisfaction Survey   Follow-Up Plan: Attend core 2   

## 2021-04-25 ENCOUNTER — Other Ambulatory Visit: Payer: Self-pay | Admitting: Gastroenterology

## 2021-04-25 DIAGNOSIS — K861 Other chronic pancreatitis: Secondary | ICD-10-CM

## 2021-04-25 DIAGNOSIS — R109 Unspecified abdominal pain: Secondary | ICD-10-CM

## 2021-05-07 ENCOUNTER — Ambulatory Visit
Admission: RE | Admit: 2021-05-07 | Discharge: 2021-05-07 | Disposition: A | Discharge status: Home / Self Care | Payer: Medicare Other | Source: Ambulatory Visit | Attending: Gastroenterology | Admitting: Gastroenterology

## 2021-05-07 DIAGNOSIS — R109 Unspecified abdominal pain: Secondary | ICD-10-CM

## 2021-05-07 DIAGNOSIS — K861 Other chronic pancreatitis: Secondary | ICD-10-CM

## 2021-05-07 MED ORDER — GADOBENATE DIMEGLUMINE 529 MG/ML IV SOLN
20.0000 mL | Freq: Once | INTRAVENOUS | Status: AC | PRN
  Administered 2021-05-07: 20 mL via INTRAVENOUS

## 2021-05-17 ENCOUNTER — Encounter: Payer: Medicare Other | Attending: Family Medicine

## 2021-05-17 DIAGNOSIS — E119 Type 2 diabetes mellitus without complications: Secondary | ICD-10-CM | POA: Insufficient documentation

## 2021-05-17 DIAGNOSIS — E1169 Type 2 diabetes mellitus with other specified complication: Secondary | ICD-10-CM | POA: Insufficient documentation

## 2021-05-24 ENCOUNTER — Ambulatory Visit: Payer: Medicare Other

## 2021-06-08 ENCOUNTER — Ambulatory Visit (INDEPENDENT_AMBULATORY_CARE_PROVIDER_SITE_OTHER): Payer: Medicare Other | Admitting: Endocrinology

## 2021-06-08 ENCOUNTER — Telehealth: Payer: Self-pay | Admitting: Endocrinology

## 2021-06-08 ENCOUNTER — Other Ambulatory Visit: Payer: Self-pay

## 2021-06-08 ENCOUNTER — Other Ambulatory Visit: Payer: Self-pay | Admitting: Endocrinology

## 2021-06-08 VITALS — BP 130/70 | HR 102 | Ht 68.0 in | Wt 188.4 lb

## 2021-06-08 DIAGNOSIS — Z794 Long term (current) use of insulin: Secondary | ICD-10-CM | POA: Diagnosis not present

## 2021-06-08 DIAGNOSIS — E119 Type 2 diabetes mellitus without complications: Secondary | ICD-10-CM

## 2021-06-08 LAB — POCT GLYCOSYLATED HEMOGLOBIN (HGB A1C): Hemoglobin A1C: 6.1 % — AB (ref 4.0–5.6)

## 2021-06-08 MED ORDER — FREESTYLE LIBRE 2 SENSOR MISC
1.0000 | 3 refills | Status: DC
Start: 2021-06-08 — End: 2022-01-09

## 2021-06-08 MED ORDER — FREESTYLE LIBRE 2 READER DEVI: 1.0000 | Freq: Once | 1 refills | Status: DC

## 2021-06-08 MED ORDER — INSULIN GLARGINE 100 UNIT/ML SOLOSTAR PEN: 30.0000 [IU] | PEN_INJECTOR | SUBCUTANEOUS | 2 refills | Status: DC

## 2021-06-08 NOTE — Telephone Encounter (Signed)
Pt calling back, after app. She forgot to ask during the app if she is able to have a continuous glucose monitor.  Patient would like a call back either way if this is possible

## 2021-06-08 NOTE — Patient Instructions (Signed)
good diet and exercise significantly improve the control of your diabetes.  please let me know if you wish to be referred to a dietician.  high blood sugar is very risky to your health.  you should see an eye doctor and dentist every year.  It is very important to get all recommended vaccinations.  Controlling your blood pressure and cholesterol drastically reduces the damage diabetes does to your body.  Those who smoke should quit.  Please discuss these with your doctor.  check your blood sugar twice a day.  vary the time of day when you check, between before the 3 meals, and at bedtime.  also check if you have symptoms of your blood sugar being too high or too low.  please keep a record of the readings and bring it to your next appointment here (or you can bring the meter itself).  You can write it on any piece of paper.  please call us sooner if your blood sugar goes below 70, or if most of your readings are over 200. I have sent a prescription to your pharmacy, to reduce the Lantus to 30 units each morning, and:  Please continue the same metformin.   Please come back for a follow-up appointment in 2 months.

## 2021-06-08 NOTE — Progress Notes (Addendum)
Subjective:    Patient ID: [name], female    DOB: 05-06-1956, 65 y.o.   MRN: [id number]  HPI pt is referred by Dr Everlene Other, for diabetes.  Pt states DM was dx'ed in 2022; she is unaware of any chronic complications; she has been on insulin since dx; pt says her diet and exercise are good; she has never had GDM, pancreatic surgery, severe hypoglycemia or DKA.  She has lost 15 lbs x a few months.  She says cbg varies from 69-123.   Past Medical History:  Diagnosis Date   Anxiety    Bipolar disorder (HCC)    Chest pain    Myocardial Perfusion Study 02/08/02 - Abnormal study suggesting ischemia in anterior to anteroseptal wall to mid to upper mid ventricular region w/associated hypocontractibility. Clinical correlation is recommended. EF=46% (Dr. 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 Small amount of pericardial fluid in the pericardial recess adjacent to the ascending aorta   Rectocele, female    SUI (stress urinary incontinence, female)    Tachycardia     Past Surgical History:  Procedure Laterality Date   ABDOMINAL HYSTERECTOMY  1982   TAH   ANKLE SURGERY Right 1998   BACK SURGERY     CARDIAC CATHETERIZATION  03/15/02   No evidence of CAD   CHOLECYSTECTOMY  1998   KNEE SURGERY Right 1985   LAMINECTOMY     OOPHORECTOMY Left 1998   LSO - Benign tumor   POSTERIOR REPAIR  1985    Social History   Socioeconomic History   Marital status: Married    Spouse name: Not on file   Number of children: Not on file   Years of education: Not on file   Highest education level: Not on file  Occupational History   Not on file  Tobacco Use   Smoking status: Former    Packs/day: 1.00    Years: 23.00    Pack years: 23.00    Types:  Cigarettes   Smokeless tobacco: Never  Vaping Use   Vaping Use: Every day   Substances: Nicotine  Substance and Sexual Activity   Alcohol use: Yes    Alcohol/week: 0.0 standard drinks    Comment: occ   Drug use: No   Sexual activity: Yes    Birth control/protection: Post-menopausal    Comment: 1st intercourse 65 yo-More than 5 partners  Other Topics Concern   Not on file  Social History Narrative   Not on file   Social Determinants of Health   Financial Resource Strain: Not on file  Food Insecurity: Not on file  Transportation Needs: Not on file  Physical Activity: Not on file  Stress: Not on file  Social Connections: Not on file  Intimate Partner Violence: Not on file    Current Outpatient Medications on File Prior to Visit  Medication Sig Dispense Refill   alprazolam (XANAX) 2 MG tablet Take 2 mg by mouth See admin instructions. Take 1 1/2 tablets (3 mg) by mouth daily at bedtime, may also take 1/2 tablet (1 mg) during the day as needed for anxiety     aspirin EC 81 MG tablet Take 81  mg by mouth See admin instructions. Take one tablet (81 mg) by mouth every other night.  Swallow whole.     blood glucose meter kit and supplies KIT Dispense based on patient and insurance preference. Use up to four times daily as directed. 1 each 0   clobetasol ointment (TEMOVATE) 0.05 % Apply 1 application topically 2 (two) times daily as needed (itching/psoriasis).     HYDROcodone-acetaminophen (NORCO) 5-325 MG per tablet Take 1 tablet by mouth 2 (two) times daily as needed (pain).     hydrocortisone 2.5 % cream Apply 1 application topically 2 (two) times daily. Apply to face     metFORMIN (GLUCOPHAGE) 500 MG tablet Take 1 tablet (500 mg total) by mouth 2 (two) times daily with a meal. 60 tablet 2   nystatin (MYCOSTATIN) 100000 UNIT/ML suspension Take 5 mLs by mouth 4 (four) times daily. Swish and spit     polyethylene glycol (MIRALAX / GLYCOLAX) 17 g packet Take 17 g by mouth 2 (two) times  daily. Mix in 8 oz liquid and drink     polyvinyl alcohol (LIQUIFILM TEARS) 1.4 % ophthalmic solution Place 1 drop into the left eye 2 (two) times daily as needed for dry eyes.     triamcinolone ointment (KENALOG) 0.1 % Apply 1 application topically 2 (two) times daily as needed (itching/psoriasis).     Vitamin D, Ergocalciferol, (DRISDOL) 1.25 MG (50000 UNIT) CAPS capsule Take 50,000 Units by mouth every Wednesday.     No current facility-administered medications on file prior to visit.    Allergies  Allergen Reactions   Codeine Nausea Only   Diphenhydramine Hcl Other (See Comments)    Makes heart race   Nasal Decongestant [Pseudoephedrine] Other (See Comments)    Nervousness, insomnia    Family History  Problem Relation Age of Onset   Diabetes Paternal Grandmother 47   Cancer Paternal Grandmother 71   Heart disease Paternal Grandfather    Heart attack Paternal Grandfather 64   Cancer Maternal Grandmother 61   Heart attack Maternal Grandfather 58   Heart defect Child    Diabetes Father    Heart disease Father    Heart attack Father    Lymphoma Maternal Uncle    Rheumatic fever Maternal Uncle    Heart attack Cousin 53    BP 130/70 (BP Location: Right Arm, Patient Position: Sitting, Cuff Size: Normal)   Pulse (!) 102   Ht 5\' 8"  (1.727 m)   Wt 188 lb 6.4 oz (85.5 kg)   SpO2 97%   BMI 28.65 kg/m     Review of Systems denies sob, n/v, and memory loss.  depression is well-controlled.      Objective:   Physical Exam Pulses: dorsalis pedis intact bilat.   MSK: no deformity of the feet CV: no leg edema Skin:  no ulcer on the feet.  normal color and temp on the feet.  Severe eczematous rash of the feet.   Neuro: sensation is intact to touch on the feet.  Lab Results  Component Value Date   HGBA1C 6.1 (A) 06/08/2021    I have reviewed outside records, and summarized: Pt was noted to have elevated A1c, and referred here.  Pt was admitted in 2022 with HONK but not  DKA.  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  Psoriasis: this suggests, along with HONK, that she is at risk for evolving type 1.  Patient Instructions  good diet and exercise significantly improve the control of your diabetes.  please let me know if you wish to be referred to a dietician.  high blood sugar is very risky to your health.  you should see an eye doctor and dentist every year.  It is very important to get all recommended vaccinations.  Controlling your blood pressure and cholesterol drastically reduces the damage diabetes does to your body.  Those who smoke should quit.  Please discuss these with your doctor.  check your blood sugar twice a day.  vary the time of day when you check, between before the 3 meals, and at bedtime.  also check if you have symptoms of your blood sugar being too high or too low.  please keep a record of the readings and bring it to your next appointment here (or you can bring the meter itself).  You can write it on any piece of paper.  please call us sooner if your blood sugar goes below 70, or if most of your readings are over 200. I have sent a prescription to your pharmacy, to reduce the Lantus to 30 units each morning, and:  Please continue the same metformin.   Please come back for a follow-up appointment in 2 months.

## 2021-06-08 NOTE — Telephone Encounter (Signed)
error 

## 2021-06-10 DIAGNOSIS — E119 Type 2 diabetes mellitus without complications: Secondary | ICD-10-CM | POA: Insufficient documentation

## 2021-06-12 ENCOUNTER — Telehealth: Payer: Self-pay | Admitting: Endocrinology

## 2021-06-12 NOTE — Telephone Encounter (Signed)
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

## 2021-06-12 NOTE — Telephone Encounter (Signed)
Spoke with pt to let her know that she needs to contact [facility] to request her device and sensors from them and then they will fax [place] the request and we will fill the papers out and fax back.

## 2021-07-12 ENCOUNTER — Telehealth: Payer: Self-pay | Admitting: Endocrinology

## 2021-07-12 NOTE — Telephone Encounter (Signed)
Pt calling in stating that she was to call the office back if blood sugar got below 70 and blood sugar is 69 at 9:27am pt has not taken any insulin this morning . Pt last took blood sugar at 6:32 pm on 07/11/2021 92. Yesterday morning it was 96. Pt would like a call back at phone number [phone] on regarding what to do.

## 2021-07-12 NOTE — Telephone Encounter (Signed)
Just an FYI..  Pt stated that she has been watching her carbs and has lost 8 more lbs.

## 2021-07-12 NOTE — Telephone Encounter (Signed)
Please advise 

## 2021-07-17 ENCOUNTER — Ambulatory Visit: Payer: Medicare Other

## 2021-08-10 ENCOUNTER — Ambulatory Visit: Payer: Medicare Other | Admitting: Endocrinology

## 2021-08-13 ENCOUNTER — Encounter: Payer: Self-pay | Admitting: Endocrinology

## 2021-08-13 ENCOUNTER — Ambulatory Visit (INDEPENDENT_AMBULATORY_CARE_PROVIDER_SITE_OTHER): Payer: Medicare Other | Admitting: Endocrinology

## 2021-08-13 ENCOUNTER — Other Ambulatory Visit: Payer: Self-pay

## 2021-08-13 VITALS — BP 128/80 | HR 74 | Ht 68.0 in | Wt 173.0 lb

## 2021-08-13 DIAGNOSIS — E119 Type 2 diabetes mellitus without complications: Secondary | ICD-10-CM

## 2021-08-13 MED ORDER — INSULIN GLARGINE 100 UNIT/ML SOLOSTAR PEN: 18.0000 [IU] | PEN_INJECTOR | SUBCUTANEOUS | 2 refills | Status: DC

## 2021-08-13 NOTE — Patient Instructions (Addendum)
check your blood sugar twice a day.  vary the time of day when you check, between before the 3 meals, and at bedtime.  also check if you have symptoms of your blood sugar being too high or too low.  please keep a record of the readings and bring it to your next appointment here (or you can bring the meter itself).  You can write it on any piece of paper.  please call us sooner if your blood sugar goes below 70, or if most of your readings are over 200.   I have sent a prescription to your pharmacy, to reduce the Lantus to 18 units each morning, and:  Please continue the same metformin.   Please come back for a follow-up appointment in 2 months.

## 2021-08-13 NOTE — Progress Notes (Signed)
Subjective:    Patient ID: [name], female    DOB: 1955/12/29, 65 y.o.   MRN: [id number]  HPI Pt returns for f/u of diabetes mellitus:  DM type: Insulin-requiring type 2 (but she is prob evolving T1--psoriasis and HONK; chronic pancreatitis could contribute) Dx'ed: 2022 Complications: none Therapy: insulin since dx, and metformin.   GDM: never DKA: never Severe hypoglycemia: never.   Pancreatitis: never Pancreatic imaging: chronic pancreatitis SDOH: none Other: she uses FL2 CGM.   Interval history: She takes 24 units qam.  I reviewed continuous glucose monitor data.  Glucose varies from 60-100.  It is in general lowest 10PM-6AM, but there is little trend throughout the day. Past Medical History:  Diagnosis Date   Anxiety    Bipolar disorder (HCC)    Chest pain    Myocardial Perfusion Study 02/08/02 - Abnormal study suggesting ischemia in anterior to anteroseptal wall to mid to upper mid ventricular region w/associated hypocontractibility. Clinical correlation is recommended. EF=46% (Dr. 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 Small amount of pericardial fluid in the pericardial recess adjacent to the ascending aorta   Rectocele, female    SUI (stress urinary incontinence, female)    Tachycardia     Past Surgical History:  Procedure Laterality Date   ABDOMINAL HYSTERECTOMY  1982   TAH   ANKLE SURGERY Right 1998   BACK SURGERY     CARDIAC CATHETERIZATION  03/15/02   No evidence of CAD   CHOLECYSTECTOMY  1998   KNEE SURGERY Right 1985   LAMINECTOMY     OOPHORECTOMY Left 1998   LSO - Benign tumor   POSTERIOR REPAIR  1985    Social History   Socioeconomic History   Marital status: Married    Spouse name: Not on  file   Number of children: Not on file   Years of education: Not on file   Highest education level: Not on file  Occupational History   Not on file  Tobacco Use   Smoking status: Former    Packs/day: 1.00    Years: 23.00    Pack years: 23.00    Types: Cigarettes   Smokeless tobacco: Never  Vaping Use   Vaping Use: Every day   Substances: Nicotine  Substance and Sexual Activity   Alcohol use: Yes    Alcohol/week: 0.0 standard drinks    Comment: occ   Drug use: No   Sexual activity: Yes    Birth control/protection: Post-menopausal    Comment: 1st intercourse 65 yo-More than 5 partners  Other Topics Concern   Not on file  Social History Narrative   Not on file   Social Determinants of Health   Financial Resource Strain: Not on file  Food Insecurity: Not on file  Transportation Needs: Not on file  Physical Activity: Not on file  Stress: Not on file  Social Connections: Not on file  Intimate Partner Violence: Not on file    Current Outpatient Medications on File Prior to Visit  Medication Sig Dispense Refill   alprazolam (XANAX) 2 MG tablet Take 2 mg by mouth See admin instructions. Take 1 1/2 tablets (3 mg) by mouth daily  at bedtime, may also take 1/2 tablet (1 mg) during the day as needed for anxiety     aspirin EC 81 MG tablet Take 81 mg by mouth See admin instructions. Take one tablet (81 mg) by mouth every other night.  Swallow whole.     blood glucose meter kit and supplies KIT Dispense based on patient and insurance preference. Use up to four times daily as directed. 1 each 0   clobetasol ointment (TEMOVATE) 0.05 % Apply 1 application topically 2 (two) times daily as needed (itching/psoriasis).     Continuous Blood Gluc Receiver (FREESTYLE LIBRE 2 READER) [place] USE AS DIRECTED 1 each 1   Continuous Blood Gluc Sensor (FREESTYLE LIBRE 2 SENSOR) MISC 1 Device by Does not apply route every 14 (fourteen) days. 6 each 3   HYDROcodone-acetaminophen (NORCO) 5-325 MG per  tablet Take 1 tablet by mouth 2 (two) times daily as needed (pain).     hydrocortisone 2.5 % cream Apply 1 application topically 2 (two) times daily. Apply to face     metFORMIN (GLUCOPHAGE) 500 MG tablet Take 1 tablet (500 mg total) by mouth 2 (two) times daily with a meal. 60 tablet 2   nystatin (MYCOSTATIN) 100000 UNIT/ML suspension Take 5 mLs by mouth 4 (four) times daily. Swish and spit     polyethylene glycol (MIRALAX / GLYCOLAX) 17 g packet Take 17 g by mouth 2 (two) times daily. Mix in 8 oz liquid and drink     polyvinyl alcohol (LIQUIFILM TEARS) 1.4 % ophthalmic solution Place 1 drop into the left eye 2 (two) times daily as needed for dry eyes.     triamcinolone ointment (KENALOG) 0.1 % Apply 1 application topically 2 (two) times daily as needed (itching/psoriasis).     Vitamin D, Ergocalciferol, (DRISDOL) 1.25 MG (50000 UNIT) CAPS capsule Take 50,000 Units by mouth every Wednesday.     No current facility-administered medications on file prior to visit.    Allergies  Allergen Reactions   Codeine Nausea Only   Diphenhydramine Hcl Other (See Comments)    Makes heart race   Nasal Decongestant [Pseudoephedrine] Other (See Comments)    Nervousness, insomnia    Family History  Problem Relation Age of Onset   Diabetes Paternal Grandmother 85   Cancer Paternal Grandmother 75   Heart disease Paternal Grandfather    Heart attack Paternal Grandfather 55   Cancer Maternal Grandmother 61   Heart attack Maternal Grandfather 58   Heart defect Child    Diabetes Father    Heart disease Father    Heart attack Father    Lymphoma Maternal Uncle    Rheumatic fever Maternal Uncle    Heart attack Cousin 53    BP 128/80 (BP Location: Right Arm, Patient Position: Sitting, Cuff Size: Normal)   Pulse 74   Ht 5\' 8"  (1.727 m)   Wt 173 lb (78.5 kg)   SpO2 97%   BMI 26.30 kg/m    Review of Systems She has cold intolerance, of the feet.  She has lost 15 lbs since last ov here.      Objective:   Physical Exam VITAL SIGNS:  See vs page GENERAL: no distress Pulses: dorsalis pedis intact bilat.   MSK: no deformity of the feet CV: no leg edema Skin:  no ulcer on the feet.  normal color and temp on the feet.  Psoriasis is noted.   Neuro: sensation is intact to touch on the feet, but decreased from normal.  A1c=5.1%    Assessment & Plan:  Insulin-requiring type 2 DM: overcontrolled . Patient Instructions  check your blood sugar twice a day.  vary the time of day when you check, between before the 3 meals, and at bedtime.  also check if you have symptoms of your blood sugar being too high or too low.  please keep a record of the readings and bring it to your next appointment here (or you can bring the meter itself).  You can write it on any piece of paper.  please call us sooner if your blood sugar goes below 70, or if most of your readings are over 200.   I have sent a prescription to your pharmacy, to reduce the Lantus to 18 units each morning, and:  Please continue the same metformin.   Please come back for a follow-up appointment in 2 months.

## 2021-08-22 ENCOUNTER — Ambulatory Visit: Payer: Medicare Other

## 2021-08-29 ENCOUNTER — Encounter: Payer: Self-pay | Admitting: Endocrinology

## 2021-08-30 ENCOUNTER — Encounter: Payer: Medicare Other | Attending: Endocrinology | Admitting: Dietician

## 2021-08-30 ENCOUNTER — Encounter: Payer: Self-pay | Admitting: Dietician

## 2021-08-30 ENCOUNTER — Other Ambulatory Visit: Payer: Self-pay

## 2021-08-30 DIAGNOSIS — E119 Type 2 diabetes mellitus without complications: Secondary | ICD-10-CM | POA: Insufficient documentation

## 2021-08-30 DIAGNOSIS — Z794 Long term (current) use of insulin: Secondary | ICD-10-CM | POA: Diagnosis present

## 2021-08-30 NOTE — Progress Notes (Signed)
Patient was seen on 08/30/2021 for the second of a series of three diabetes self-management courses at the [facility]. The following learning objectives were met by the patient during this class:  Describe the role of different macronutrients on glucose Explain how carbohydrates affect blood glucose State what foods contain the most carbohydrates Demonstrate carbohydrate counting Demonstrate how to read Nutrition Facts food label Describe effects of various fats on heart health Describe the importance of good nutrition for health and healthy eating strategies Describe techniques for managing your shopping, cooking and meal planning List strategies to follow meal plan when dining out Describe the effects of alcohol on glucose and how to use it safely  Goals:  Follow Diabetes Meal Plan as instructed  Aim to spread carbs evenly throughout the day  Aim for 3 meals per day and snacks as needed Include lean protein foods to meals/snacks  Monitor glucose levels as instructed by your doctor   Follow-Up Plan: Attend Core 3 Work towards following your personal food plan.   

## 2021-09-19 ENCOUNTER — Ambulatory Visit: Payer: Medicare Other

## 2021-10-18 ENCOUNTER — Ambulatory Visit: Payer: Medicare Other | Admitting: Endocrinology

## 2021-10-18 ENCOUNTER — Other Ambulatory Visit: Payer: Self-pay

## 2021-10-18 VITALS — BP 120/70 | HR 84 | Ht 68.0 in | Wt 164.0 lb

## 2021-10-18 DIAGNOSIS — E119 Type 2 diabetes mellitus without complications: Secondary | ICD-10-CM

## 2021-10-18 LAB — POCT GLYCOSYLATED HEMOGLOBIN (HGB A1C): Hemoglobin A1C: 5.1 % (ref 4.0–5.6)

## 2021-10-18 MED ORDER — INSULIN GLARGINE 100 UNIT/ML SOLOSTAR PEN: 16.0000 [IU] | PEN_INJECTOR | SUBCUTANEOUS | 2 refills | Status: DC

## 2021-10-18 NOTE — Progress Notes (Signed)
Subjective:    Patient ID: [name], female    DOB: [birth year], 65 y.o.   MRN: [id number]  HPI Pt returns for f/u of diabetes mellitus:  DM type: Insulin-requiring type 2 (but she is prob evolving T1--psoriasis and HONK; chronic pancreatitis could contribute).  Dx'ed: 7124 Complications: none Therapy: insulin since dx, and metformin.   GDM: never DKA: never Severe hypoglycemia: never.   Pancreatitis: never Pancreatic imaging: chronic pancreatitis SDOH: none Other: she uses FL2 CGM.   Interval history: I reviewed continuous glucose monitor data.  Glucose varies from 65-120.  There is no trend throughout the day.  She reduced the insulin to 18 units, just 2 weeks ago.   Past Medical History:  Diagnosis Date   Anxiety    Bipolar disorder ([place])    Chest pain    Myocardial Perfusion Study 02/08/02 - Abnormal study suggesting ischemia in anterior to anteroseptal wall to mid to upper mid ventricular region w/associated hypocontractibility. Clinical correlation is recommended. EF=46% (Dr. 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 Small amount of pericardial fluid in the pericardial recess adjacent to the ascending aorta   Rectocele, female    SUI (stress urinary incontinence, female)    Tachycardia     Past Surgical History:  Procedure Laterality Date   ABDOMINAL HYSTERECTOMY  1982   TAH   ANKLE SURGERY Right 1998   BACK SURGERY     CARDIAC CATHETERIZATION  03/15/02   No evidence of CAD   CHOLECYSTECTOMY  1998   KNEE SURGERY Right 1985   LAMINECTOMY     OOPHORECTOMY Left 1998   LSO - Benign tumor   POSTERIOR REPAIR  1985    Social History   Socioeconomic History   Marital status: Married    Spouse name: Not on file    Number of children: Not on file   Years of education: Not on file   Highest education level: Not on file  Occupational History   Not on file  Tobacco Use   Smoking status: Former    Packs/day: 1.00    Years: 23.00    Pack years: 23.00    Types: Cigarettes   Smokeless tobacco: Never  Vaping Use   Vaping Use: Every day   Substances: Nicotine  Substance and Sexual Activity   Alcohol use: Yes    Alcohol/week: 0.0 standard drinks    Comment: occ   Drug use: No   Sexual activity: Yes    Birth control/protection: Post-menopausal    Comment: 1st intercourse 65 yo-More than 5 partners  Other Topics Concern   Not on file  Social History Narrative   Not on file   Social Determinants of Health   Financial Resource Strain: Not on file  Food Insecurity: Not on file  Transportation Needs: Not on file  Physical Activity: Not on file  Stress: Not on file  Social Connections: Not on file  Intimate Partner Violence: Not on file    Current Outpatient Medications on File Prior to Visit  Medication Sig Dispense Refill   alprazolam (XANAX) 2 MG tablet Take 2 mg by mouth See admin instructions. Take 1 1/2 tablets (3 mg) by  mouth daily at bedtime, may also take 1/2 tablet (1 mg) during the day as needed for anxiety     aspirin EC 81 MG tablet Take 81 mg by mouth See admin instructions. Take one tablet (81 mg) by mouth every other night.  Swallow whole.     blood glucose meter kit and supplies KIT Dispense based on patient and insurance preference. Use up to four times daily as directed. 1 each 0   clobetasol ointment (TEMOVATE) 4.43 % Apply 1 application topically 2 (two) times daily as needed (itching/psoriasis).     Continuous Blood Gluc Receiver (FREESTYLE LIBRE 2 READER) [place] USE AS DIRECTED 1 each 1   Continuous Blood Gluc Sensor (FREESTYLE LIBRE 2 SENSOR) MISC 1 Device by Does not apply route every 14 (fourteen) days. 6 each 3   HYDROcodone-acetaminophen (NORCO) 5-325 MG per tablet Take 1  tablet by mouth 2 (two) times daily as needed (pain).     hydrocortisone 2.5 % cream Apply 1 application topically 2 (two) times daily. Apply to face     metFORMIN (GLUCOPHAGE) 500 MG tablet Take 1 tablet (500 mg total) by mouth 2 (two) times daily with a meal. 60 tablet 2   nystatin (MYCOSTATIN) 100000 UNIT/ML suspension Take 5 mLs by mouth 4 (four) times daily. Swish and spit     polyethylene glycol (MIRALAX / GLYCOLAX) 17 g packet Take 17 g by mouth 2 (two) times daily. Mix in 8 oz liquid and drink     polyvinyl alcohol (LIQUIFILM TEARS) 1.4 % ophthalmic solution Place 1 drop into the left eye 2 (two) times daily as needed for dry eyes.     triamcinolone ointment (KENALOG) 0.1 % Apply 1 application topically 2 (two) times daily as needed (itching/psoriasis).     Vitamin D, Ergocalciferol, (DRISDOL) 1.25 MG (50000 UNIT) CAPS capsule Take 50,000 Units by mouth every Wednesday.     No current facility-administered medications on file prior to visit.    Allergies  Allergen Reactions   Codeine Nausea Only   Diphenhydramine Hcl Other (See Comments)    Makes heart race   Nasal Decongestant [Pseudoephedrine] Other (See Comments)    Nervousness, insomnia    Family History  Problem Relation Age of Onset   Diabetes Paternal Grandmother 32   Cancer Paternal Grandmother 73   Heart disease Paternal Grandfather    Heart attack Paternal Grandfather 37   Cancer Maternal Grandmother 61   Heart attack Maternal Grandfather 58   Heart defect Child    Diabetes Father    Heart disease Father    Heart attack Father    Lymphoma Maternal Uncle    Rheumatic fever Maternal Uncle    Heart attack Cousin 53    BP 120/70   Pulse 84   Ht $R'5\' 8"'nY$  (1.727 m)   Wt 164 lb (74.4 kg)   SpO2 97%   BMI 24.94 kg/m   Review of Systems     Objective:   Physical Exam    Lab Results  Component Value Date   HGBA1C 5.1 10/18/2021      Assessment & Plan:  Insulin-requiring type 2 DM: overcontrolled.    Patient Instructions  check your blood sugar twice a day.  vary the time of day when you check, between before the 3 meals, and at bedtime.  also check if you have symptoms of your blood sugar being too high or too low.  please keep a record of the readings and bring it to your next  appointment here (or you can bring the meter itself).  You can write it on any piece of paper.  please call us sooner if your blood sugar goes below 70, or if most of your readings are over 200.   I have sent a prescription to your pharmacy, to reduce the Lantus to 16 units each morning, and:  Please continue the same metformin.   To prevent the sensors from falling off, you should apply benzoin to the area. After it dries you can put on the sensors.   Please come back for a follow-up appointment in 2 months.

## 2021-10-18 NOTE — Patient Instructions (Addendum)
check your blood sugar twice a day.  vary the time of day when you check, between before the 3 meals, and at bedtime.  also check if you have symptoms of your blood sugar being too high or too low.  please keep a record of the readings and bring it to your next appointment here (or you can bring the meter itself).  You can write it on any piece of paper.  please call us sooner if your blood sugar goes below 70, or if most of your readings are over 200.   I have sent a prescription to your pharmacy, to reduce the Lantus to 16 units each morning, and:  Please continue the same metformin.   To prevent the sensors from falling off, you should apply benzoin to the area. After it dries you can put on the sensors.   Please come back for a follow-up appointment in 2 months.

## 2021-10-30 ENCOUNTER — Ambulatory Visit: Payer: Medicare Other

## 2021-11-03 ENCOUNTER — Other Ambulatory Visit: Payer: Self-pay | Admitting: Endocrinology

## 2021-11-06 ENCOUNTER — Other Ambulatory Visit (HOSPITAL_COMMUNITY): Payer: Self-pay

## 2021-11-06 ENCOUNTER — Other Ambulatory Visit: Payer: Self-pay | Admitting: Endocrinology

## 2021-11-06 MED ORDER — LEVEMIR FLEXTOUCH 100 UNIT/ML ~~LOC~~ SOPN: 20.0000 [IU] | PEN_INJECTOR | Freq: Every morning | SUBCUTANEOUS | 3 refills | Status: DC

## 2021-12-19 ENCOUNTER — Encounter: Payer: Self-pay | Admitting: Endocrinology

## 2021-12-19 ENCOUNTER — Ambulatory Visit: Payer: HMO | Admitting: Endocrinology

## 2021-12-19 ENCOUNTER — Other Ambulatory Visit: Payer: Self-pay

## 2021-12-19 VITALS — BP 114/64 | HR 78 | Ht 68.0 in | Wt 161.0 lb

## 2021-12-19 DIAGNOSIS — E119 Type 2 diabetes mellitus without complications: Secondary | ICD-10-CM

## 2021-12-19 LAB — POCT GLYCOSYLATED HEMOGLOBIN (HGB A1C): Hemoglobin A1C: 5.3 % (ref 4.0–5.6)

## 2021-12-19 MED ORDER — INSULIN GLARGINE 100 UNIT/ML SOLOSTAR PEN: 14.0000 [IU] | PEN_INJECTOR | SUBCUTANEOUS | 2 refills | Status: DC

## 2021-12-19 NOTE — Patient Instructions (Addendum)
check your blood sugar twice a day.  vary the time of day when you check, between before the 3 meals, and at bedtime.  also check if you have symptoms of your blood sugar being too high or too low.  please keep a record of the readings and bring it to your next appointment here (or you can bring the meter itself).  You can write it on any piece of paper.  please call us sooner if your blood sugar goes below 70, or if most of your readings are over 200.   I have sent a prescription to your pharmacy, to reduce the Lantus to 14 units each morning, and:   Please continue the same metformin.   Please come back for a follow-up appointment in 2 months.

## 2021-12-19 NOTE — Progress Notes (Signed)
Subjective:    Patient ID: [name], female    DOB: 1956/01/24, 66 y.o.   MRN: [id number]  HPI Pt returns for f/u of diabetes mellitus:  DM type: Insulin-requiring type 2 (but she is prob evolving T1--psoriasis and HONK; chronic pancreatitis could contribute).  Dx'ed: 2426 Complications: none Therapy: insulin since dx, and metformin.   GDM: never DKA: never Severe hypoglycemia: never.   Pancreatitis: never Pancreatic imaging: chronic pancreatitis.   SDOH: none Other: she uses FL2 CGM.   Interval history: I reviewed continuous glucose monitor data.  Glucose varies from 75-130.  It increases slightly overnight, but there is no trend throughout the day.  She declines to reduce the insulin more than 2 units at a time.   Past Medical History:  Diagnosis Date   Anxiety    Bipolar disorder ([place])    Chest pain    Myocardial Perfusion Study 02/08/02 - Abnormal study suggesting ischemia in anterior to anteroseptal wall to mid to upper mid ventricular region w/associated hypocontractibility. Clinical correlation is recommended. EF=46% (Dr. 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 Small amount of pericardial fluid in the pericardial recess adjacent to the ascending aorta   Rectocele, female    SUI (stress urinary incontinence, female)    Tachycardia     Past Surgical History:  Procedure Laterality Date   ABDOMINAL HYSTERECTOMY  1982   TAH   ANKLE SURGERY Right 1998   BACK SURGERY     CARDIAC CATHETERIZATION  03/15/02   No evidence of CAD   CHOLECYSTECTOMY  1998   KNEE SURGERY Right 1985   LAMINECTOMY     OOPHORECTOMY Left 1998   LSO - Benign tumor   POSTERIOR REPAIR  1985    Social History   Socioeconomic History   Marital  status: Married    Spouse name: Not on file   Number of children: Not on file   Years of education: Not on file   Highest education level: Not on file  Occupational History   Not on file  Tobacco Use   Smoking status: Former    Packs/day: 1.00    Years: 23.00    Pack years: 23.00    Types: Cigarettes   Smokeless tobacco: Never  Vaping Use   Vaping Use: Every day   Substances: Nicotine  Substance and Sexual Activity   Alcohol use: Yes    Alcohol/week: 0.0 standard drinks    Comment: occ   Drug use: No   Sexual activity: Yes    Birth control/protection: Post-menopausal    Comment: 1st intercourse 66 yo-More than 5 partners  Other Topics Concern   Not on file  Social History Narrative   Not on file   Social Determinants of Health   Financial Resource Strain: Not on file  Food Insecurity: Not on file  Transportation Needs: Not on file  Physical Activity: Not on file  Stress: Not on file  Social Connections: Not on file  Intimate Partner Violence: Not on file    Current Outpatient Medications on File Prior to Visit  Medication Sig Dispense Refill   alprazolam (XANAX) 2 MG tablet Take 2 mg by mouth See  admin instructions. Take 1 1/2 tablets (3 mg) by mouth daily at bedtime, may also take 1/2 tablet (1 mg) during the day as needed for anxiety     aspirin EC 81 MG tablet Take 81 mg by mouth See admin instructions. Take one tablet (81 mg) by mouth every other night.  Swallow whole.     blood glucose meter kit and supplies KIT Dispense based on patient and insurance preference. Use up to four times daily as directed. 1 each 0   clobetasol ointment (TEMOVATE) 1.61 % Apply 1 application topically 2 (two) times daily as needed (itching/psoriasis).     Continuous Blood Gluc Receiver (FREESTYLE LIBRE 2 READER) DEVI USE AS DIRECTED 1 each 1   Continuous Blood Gluc Sensor (FREESTYLE LIBRE 2 SENSOR) MISC 1 Device by Does not apply route every 14 (fourteen) days. 6 each 3    HYDROcodone-acetaminophen (NORCO) 5-325 MG per tablet Take 1 tablet by mouth 2 (two) times daily as needed (pain).     hydrocortisone 2.5 % cream Apply 1 application topically 2 (two) times daily. Apply to face     metFORMIN (GLUCOPHAGE) 500 MG tablet Take 1 tablet (500 mg total) by mouth 2 (two) times daily with a meal. 60 tablet 2   nystatin (MYCOSTATIN) 100000 UNIT/ML suspension Take 5 mLs by mouth 4 (four) times daily. Swish and spit     polyethylene glycol (MIRALAX / GLYCOLAX) 17 g packet Take 17 g by mouth 2 (two) times daily. Mix in 8 oz liquid and drink     polyvinyl alcohol (LIQUIFILM TEARS) 1.4 % ophthalmic solution Place 1 drop into the left eye 2 (two) times daily as needed for dry eyes.     triamcinolone ointment (KENALOG) 0.1 % Apply 1 application topically 2 (two) times daily as needed (itching/psoriasis).     Vitamin D, Ergocalciferol, (DRISDOL) 1.25 MG (50000 UNIT) CAPS capsule Take 50,000 Units by mouth every Wednesday.     No current facility-administered medications on file prior to visit.    Allergies  Allergen Reactions   Codeine Nausea Only   Diphenhydramine Hcl Other (See Comments)    Makes heart race   Nasal Decongestant [Pseudoephedrine] Other (See Comments)    Nervousness, insomnia    Family History  Problem Relation Age of Onset   Diabetes Paternal Grandmother 82   Cancer Paternal Grandmother 11   Heart disease Paternal Grandfather    Heart attack Paternal Grandfather 43   Cancer Maternal Grandmother 61   Heart attack Maternal Grandfather 58   Heart defect Child    Diabetes Father    Heart disease Father    Heart attack Father    Lymphoma Maternal Uncle    Rheumatic fever Maternal Uncle    Heart attack Cousin 53    BP 114/64    Pulse 78    Ht _0  (1.727 m)    Wt 161 lb (73 kg)    SpO2 96%    BMI 24.48 kg/m    Review of Systems     Objective:   Physical Exam    Lab Results  Component Value Date   HGBA1C 5.3 12/19/2021      Assessment  & Plan:  Insulin-requiring type 2 DM: overcontrolled.    Patient Instructions  check your blood sugar twice a day.  vary the time of day when you check, between before the 3 meals, and at bedtime.  also check if you have symptoms of your blood sugar being too high or  too low.  please keep a record of the readings and bring it to your next appointment here (or you can bring the meter itself).  You can write it on any piece of paper.  please call us sooner if your blood sugar goes below 70, or if most of your readings are over 200.   I have sent a prescription to your pharmacy, to reduce the Lantus to 14 units each morning, and:   Please continue the same metformin.   Please come back for a follow-up appointment in 2 months.

## 2021-12-20 ENCOUNTER — Ambulatory Visit: Payer: Medicare Other

## 2022-01-09 ENCOUNTER — Other Ambulatory Visit: Payer: Self-pay

## 2022-01-09 DIAGNOSIS — E119 Type 2 diabetes mellitus without complications: Secondary | ICD-10-CM

## 2022-01-09 MED ORDER — FREESTYLE LIBRE 2 SENSOR MISC: 1.0000 | 3 refills | Status: DC

## 2022-01-10 ENCOUNTER — Ambulatory Visit (HOSPITAL_COMMUNITY)
Admission: EM | Admit: 2022-01-10 | Discharge: 2022-01-10 | Disposition: A | Discharge status: Home / Self Care | Payer: HMO | Attending: Physician Assistant | Admitting: Physician Assistant

## 2022-01-10 ENCOUNTER — Other Ambulatory Visit: Payer: Self-pay

## 2022-01-10 ENCOUNTER — Encounter (HOSPITAL_COMMUNITY): Payer: Self-pay

## 2022-01-10 DIAGNOSIS — K1120 Sialoadenitis, unspecified: Secondary | ICD-10-CM

## 2022-01-10 LAB — POCT RAPID STREP A, ED / UC: Streptococcus, Group A Screen (Direct): NEGATIVE

## 2022-01-10 MED ORDER — AMOXICILLIN-POT CLAVULANATE 875-125 MG PO TABS: 1.0000 | ORAL_TABLET | Freq: Two times a day (BID) | ORAL | 0 refills | Status: DC

## 2022-01-10 NOTE — ED Triage Notes (Signed)
Pt c/o rt sided swollen lymphnode radiating to rt ear and rt eye. States some tingling in lt ear. Pain when chewing.  ?

## 2022-01-10 NOTE — ED Provider Notes (Signed)
[facility]    CSN: [id number] Arrival date & time: 01/10/22  1848      History   Chief Complaint Chief Complaint  Patient presents with   Lymphadenopathy    HPI [name] is a 66 y.o. female.   Patient presents today with a several hour history of right submandibular pain.  Reports that she was going down when she suddenly had pain that has spread and began shooting towards her right ear.  She does report associated sore throat but denies additional symptoms including fever, cough, nasal congestion.  She has not tried any over-the-counter medication for symptom management.  She does have a history of dry mouth related to her diabetes but reports she is currently making an normal amount of saliva.  Denies any known sick contacts including exposure to strep pharyngitis.  She is able to swallow without difficulty.   Past Medical History:  Diagnosis Date   Anxiety    Bipolar disorder ([place])    Chest pain    Myocardial Perfusion Study 02/08/02 - Abnormal study suggesting ischemia in anterior to anteroseptal wall to mid to upper mid ventricular region w/associated hypocontractibility. Clinical correlation is recommended. EF=46% (Dr. 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 Small amount of pericardial fluid in the pericardial recess adjacent to the ascending aorta   Rectocele, female    SUI (stress urinary incontinence, female)    Tachycardia     Patient Active Problem List   Diagnosis Date Noted   Diabetes ([place]) 06/10/2021   Hyperosmolar hyperglycemic state (HHS) ([place]) 69/45/0388   Metabolic acidosis with increased anion gap and accumulation of organic acids 03/11/2021   Leukocytosis 03/11/2021   Polycythemia  03/11/2021   Transaminitis 03/11/2021   AKI (acute kidney injury) ([place]) 03/11/2021   Hearing loss 08/19/2016   Cystic mesothelioma    SUI (stress urinary incontinence, female)    Ovarian neoplasm    Rectocele, female    HSV infection    BACK PAIN 04/15/2008   CALF PAIN, LEFT 01/29/2008   PHARYNGITIS, ACUTE 06/24/2007   SCIATICA, LEFT 04/20/2007   OBESITY NOS 01/15/2007   BULIMIA NERVOSA 01/15/2007   SYMPTOM, VOMITING ALONE 01/15/2007   Bipolar I disorder ([place]) 01/08/2007   TOBACCO DEPENDENCE 01/08/2007   ORTHOSTATIC HYPOTENSION 01/08/2007   PALPITATIONS 01/08/2007    Past Surgical History:  Procedure Laterality Date   ABDOMINAL HYSTERECTOMY  1982   TAH   ANKLE SURGERY Right 1998   BACK SURGERY     CARDIAC CATHETERIZATION  03/15/02   No evidence of CAD   CHOLECYSTECTOMY  1998   KNEE SURGERY Right 1985   LAMINECTOMY     OOPHORECTOMY Left 1998   LSO - Benign tumor   POSTERIOR REPAIR  1985    OB History     Gravida  9   Para  5   Term      Preterm      AB  4   Living  4      SAB  4   IAB      Ectopic      Multiple      Live Births  Home Medications    Prior to Admission medications   Medication Sig Start Date End Date Taking? Authorizing Provider  amoxicillin-clavulanate (AUGMENTIN) 875-125 MG tablet Take 1 tablet by mouth every 12 (twelve) hours. 01/10/22  Yes Heliodoro Domagalski, Derry Skill, PA-C  alprazolam (XANAX) 2 MG tablet Take 2 mg by mouth See admin instructions. Take 1 1/2 tablets (3 mg) by mouth daily at bedtime, may also take 1/2 tablet (1 mg) during the day as needed for anxiety    [provider]  aspirin EC 81 MG tablet Take 81 mg by mouth See admin instructions. Take one tablet (81 mg) by mouth every other night.  Swallow whole.    [provider]  blood glucose meter kit and supplies KIT Dispense based on patient and insurance preference. Use up to four times daily as directed. 03/13/21   Oswald Hillock, MD  clobetasol  ointment (TEMOVATE) 4.81 % Apply 1 application topically 2 (two) times daily as needed (itching/psoriasis). 11/22/20   [provider]  Continuous Blood Gluc Receiver (FREESTYLE LIBRE 2 READER) DEVI USE AS DIRECTED 06/11/21   Renato Shin, MD  Continuous Blood Gluc Sensor (FREESTYLE LIBRE 2 SENSOR) MISC 1 Device by Does not apply route every 14 (fourteen) days. 01/09/22   Renato Shin, MD  HYDROcodone-acetaminophen (NORCO) 5-325 MG per tablet Take 1 tablet by mouth 2 (two) times daily as needed (pain).    [provider]  hydrocortisone 2.5 % cream Apply 1 application topically 2 (two) times daily. Apply to face 02/16/19   [provider]  insulin glargine (LANTUS) 100 UNIT/ML Solostar Pen Inject 14 Units into the skin every morning. 12/19/21   Renato Shin, MD  metFORMIN (GLUCOPHAGE) 500 MG tablet Take 1 tablet (500 mg total) by mouth 2 (two) times daily with a meal. 03/13/21   Oswald Hillock, MD  nystatin (MYCOSTATIN) 100000 UNIT/ML suspension Take 5 mLs by mouth 4 (four) times daily. Swish and spit 03/09/21   [provider]  polyethylene glycol (MIRALAX / GLYCOLAX) 17 g packet Take 17 g by mouth 2 (two) times daily. Mix in 8 oz liquid and drink    [provider]  polyvinyl alcohol (LIQUIFILM TEARS) 1.4 % ophthalmic solution Place 1 drop into the left eye 2 (two) times daily as needed for dry eyes.    [provider]  triamcinolone ointment (KENALOG) 0.1 % Apply 1 application topically 2 (two) times daily as needed (itching/psoriasis). 09/30/16   [provider]  Vitamin D, Ergocalciferol, (DRISDOL) 1.25 MG (50000 UNIT) CAPS capsule Take 50,000 Units by mouth every Wednesday.    [provider]    Family History Family History  Problem Relation Age of Onset   Diabetes Paternal Grandmother 98   Cancer Paternal Grandmother 94   Heart disease Paternal Grandfather    Heart attack Paternal Grandfather 42   Cancer Maternal Grandmother  61   Heart attack Maternal Grandfather 58   Heart defect Child    Diabetes Father    Heart disease Father    Heart attack Father    Lymphoma Maternal Uncle    Rheumatic fever Maternal Uncle    Heart attack Cousin 53    Social History Social History   Tobacco Use   Smoking status: Former    Packs/day: 1.00    Years: 23.00    Pack years: 23.00    Types: Cigarettes   Smokeless tobacco: Never  Vaping Use   Vaping Use: Every day   Substances: Nicotine  Substance Use Topics   Alcohol use: Yes    Alcohol/week: 0.0 standard drinks    Comment: occ   Drug use: No     Allergies   Codeine, Diphenhydramine hcl, and Nasal decongestant [pseudoephedrine]   Review of Systems Review of Systems  Constitutional:  Positive for activity change. Negative for appetite change, fatigue and fever.  HENT:  Positive for sore throat. Negative for congestion, sinus pressure and sneezing.   Respiratory:  Negative for cough and shortness of breath.   Cardiovascular:  Negative for chest pain.  Gastrointestinal:  Negative for abdominal pain, diarrhea, nausea and vomiting.  Neurological:  Negative for dizziness, light-headedness and headaches.  Hematological:  Positive for adenopathy.    Physical Exam Triage Vital Signs ED Triage Vitals [01/10/22 1913]  Enc Vitals Group     BP 127/77     Pulse Rate 79     Resp 18     Temp 98.2 F (36.8 C)     Temp Source Oral     SpO2 95 %     Weight      Height      Head Circumference      Peak Flow      Pain Score 4     Pain Loc      Pain Edu?      Excl. in [place]?    No data found.  Updated Vital Signs BP 127/77 (BP Location: Left Arm)    Pulse 79    Temp 98.2 F (36.8 C) (Oral)    Resp 18    SpO2 95%   Visual Acuity Right Eye Distance:   Left Eye Distance:   Bilateral Distance:    Right Eye Near:   Left Eye Near:    Bilateral Near:     Physical Exam Vitals reviewed.  Constitutional:      General: She is awake. She is not in acute  distress.    Appearance: Normal appearance. She is well-developed. She is not ill-appearing.     Comments: Very pleasant female appears stated age in no acute distress  HENT:     Head: Normocephalic and atraumatic.     Right Ear: Tympanic membrane, ear canal and external ear normal. Tympanic membrane is not erythematous or bulging.     Left Ear: Tympanic membrane, ear canal and external ear normal. Tympanic membrane is not erythematous or bulging.     Nose:     Right Sinus: No maxillary sinus tenderness or frontal sinus tenderness.     Left Sinus: No maxillary sinus tenderness or frontal sinus tenderness.     Mouth/Throat:     Pharynx: Uvula midline. Posterior oropharyngeal erythema present. No oropharyngeal exudate.     Tonsils: No tonsillar exudate or tonsillar abscesses.  Cardiovascular:     Rate and Rhythm: Normal rate and regular rhythm.     Heart sounds: Normal heart sounds, S1 normal and S2 normal. No murmur heard. Pulmonary:     Effort: Pulmonary effort is normal.     Breath sounds: Normal breath sounds. No wheezing, rhonchi or rales.     Comments: Clear to auscultation bilaterally Lymphadenopathy:     Head:     Right side of head: Submandibular adenopathy present. No submental or tonsillar adenopathy.     Left side of head: No submental, submandibular or tonsillar adenopathy.     Cervical: No cervical adenopathy.     Comments: 1 cm by half centimeter mobile nodule in right submandibular area.  Psychiatric:  Behavior: Behavior is cooperative.     UC Treatments / Results  Labs (all labs ordered are listed, but only abnormal results are displayed) Labs Reviewed  POCT RAPID STREP A, ED / UC    EKG   Radiology No results found.  Procedures Procedures (including critical care time)  Medications Ordered in UC Medications - No data to display  Initial Impression / Assessment and Plan / UC Course  I have reviewed the triage vital signs and the nursing  notes.  Pertinent labs & imaging results that were available during my care of the patient were reviewed by me and considered in my medical decision making (see chart for details).     I suspect symptoms are more related to infected submandibular salivary gland and less likely lymphadenopathy.  Will cover with Augmentin.  Strep testing was obtained given associated sore throat which was negative.  Recommended patient use sour candies to encourage saliva production and manage symptoms.  She can use over-the-counter analgesics for pain relief.  She is to gargle with warm salt water.  Discussed that if symptoms persist she should follow-up with ENT and was given contact information for local provider.  Discussed that if she has any sudden worsening of symptoms including swelling involving her neck/throat, high fever, difficulty swallowing, shortness of breath, nausea/vomiting she needs to go to the emergency room.  Strict return precautions given to which she expressed understanding.  Final Clinical Impressions(s) / UC Diagnoses   Final diagnoses:  Salivary gland infection     Discharge Instructions      I believe that you have an infected salivary gland.  Please start Augmentin twice daily.  Use sour things to increase saliva production to improve symptoms.  Use Tylenol ibuprofen for pain.  Follow-up with ENT as soon as possible.  If you develop any worsening symptoms including increased swelling, fever, nausea, vomiting you need to go to the emergency room.     ED Prescriptions     Medication Sig Dispense Auth. Provider   amoxicillin-clavulanate (AUGMENTIN) 875-125 MG tablet Take 1 tablet by mouth every 12 (twelve) hours. 14 tablet Aireanna Luellen, Derry Skill, PA-C      PDMP not reviewed this encounter.   Terrilee Croak, PA-C 01/10/22 2004

## 2022-01-10 NOTE — Discharge Instructions (Signed)
I believe that you have an infected salivary gland.  Please start Augmentin twice daily.  Use sour things to increase saliva production to improve symptoms.  Use Tylenol ibuprofen for pain.  Follow-up with ENT as soon as possible.  If you develop any worsening symptoms including increased swelling, fever, nausea, vomiting you need to go to the emergency room. ?

## 2022-01-24 ENCOUNTER — Encounter: Payer: Self-pay | Admitting: Dietician

## 2022-01-24 ENCOUNTER — Encounter: Payer: HMO | Attending: Family Medicine | Admitting: Dietician

## 2022-01-24 ENCOUNTER — Other Ambulatory Visit: Payer: Self-pay

## 2022-01-24 DIAGNOSIS — Z794 Long term (current) use of insulin: Secondary | ICD-10-CM | POA: Diagnosis present

## 2022-01-24 DIAGNOSIS — E119 Type 2 diabetes mellitus without complications: Secondary | ICD-10-CM | POA: Diagnosis present

## 2022-01-24 NOTE — Patient Instructions (Addendum)
Moderate your portion sizes when having your apple and PB. Have an apple about the size of a baseball, and a serving of PB about the size of a golf ball (2 Tbsp). ? ?Increase your carb intake to around 30 g a meal and total close to 90-100 per day. ? ?Go out and walk in the grocery store or department stores in the evenings. ?

## 2022-01-24 NOTE — Progress Notes (Signed)
Appointment start:  1520 Appointment End:  1550 ? ?Patient attended Diabetes Core Classes 1-3 between 04/19/2021 and 10/30/2021 at [facility]. ?The purpose of the meeting today is to review information learned during those classes as well as review patient application and goals. ? ? ?What are one or two positive things that you are doing right now to manage your diabetes?  Taking medicine as prescribed, monitoring food intake. ? ?What is the hardest part about your diabetes right now, causing you the most concern, or is the most worrisome to you about your diabetes?  Eating enough carbs without being restrictive. ? ?What questions do you have today?  How many carbs and calories are appropriate? ? ?Have you participated in any diabetes support group?  No ? ?History:  T2DM, AKI ?A1C:  5.3 (12/19/2021) ?Medications include:  Lantus, Metformin ?Sleep:  Sleeps well ?Weight:  161 lbs ?Blood Glucose: ? Fasting:  ~100 ? 2 hours after starting a meal:  85 - 105 ? Have you had any low blood sugar readings in the past month?  4-5 times ? ?Social History:  Pt states that they have been bored since 2019, and it has gotten worse recently. Pt states they have fallen off the wagon in the last 5 weeks. Pt is eating out of boredom in the evening. Pt will have an apple and 4 Tbsp of peanut butter. Pt states that their husband will snack on sweets in the evening. ?Pt has been eating legume pastas, high fiber tortillas during the day. Meals are usually proteins, 2 vegetables, and occasionally a starchy vegetable. ?Pt reports having bad psoriasis flare-ups that keep them from being active, pt is doing upper body exercises when they can. ?Exercise:  Dances occasionally ? ?24 hour diet recall: ?Breakfast:  Atkins shake ?Snack:  none ?Lunch:  1/2 [employer] bean and cheese burrito ?Snack:  none ?Dinner:  Dani Gobble, 6 shrimp, salad ?Snack:  none ?Beverages:  water ? ? ?Specific focus but not limited to the  following: ?Review of blood glucose monitoring and interpretation including the recommended target ranges and Hgb A1c.  ?Review of carbohydrate counting, importance of regularly scheduled meals/snacks, and meal planning to improve quality of diet. ?Review of the effects of physical activity on glucose levels and long-term glucose control.  Recommended goal of 150 minutes of physical activity/week. ?Review of patient medications and discussed role of medication on blood glucose and possible side effects. ?Discussion of strategies to manage stress, psychosocial issues, and other obstacles to diabetes management. ?Review of short-term complications: hyper- and hypo-glycemia (causes, symptoms, and treatment options) ?Review of prevention, detection, and treatment of long-term complications.  Discussion of the role of prolonged elevated glucose levels on body systems. ? ?Continuing Goals: ?Continue to keep an eye on your A1c, keep it in the mid to low 5% range ?Moderate your portion sizes when having your apple and PB. Have an apple about the size of a baseball, and a serving of PB about the size of a golf ball (2 Tbsp). ?Increase your carb intake to around 30 g a meal and total close to 90-100 per day. ?Go out and walk in the grocery store or department stores in the evenings. ? ?Future Follow up:  6-8 weeks ? ?

## 2022-02-28 ENCOUNTER — Ambulatory Visit (INDEPENDENT_AMBULATORY_CARE_PROVIDER_SITE_OTHER): Payer: HMO | Admitting: Ophthalmology

## 2022-02-28 ENCOUNTER — Encounter: Payer: Self-pay | Admitting: Endocrinology

## 2022-02-28 ENCOUNTER — Encounter (INDEPENDENT_AMBULATORY_CARE_PROVIDER_SITE_OTHER): Payer: Medicare Other | Admitting: Ophthalmology

## 2022-02-28 ENCOUNTER — Encounter (INDEPENDENT_AMBULATORY_CARE_PROVIDER_SITE_OTHER): Payer: Self-pay | Admitting: Ophthalmology

## 2022-02-28 ENCOUNTER — Ambulatory Visit: Payer: HMO | Admitting: Endocrinology

## 2022-02-28 VITALS — BP 122/70 | HR 90 | Ht 68.0 in | Wt 160.2 lb

## 2022-02-28 VITALS — BP 116/73

## 2022-02-28 DIAGNOSIS — Z9889 Other specified postprocedural states: Secondary | ICD-10-CM | POA: Diagnosis not present

## 2022-02-28 DIAGNOSIS — H43813 Vitreous degeneration, bilateral: Secondary | ICD-10-CM

## 2022-02-28 DIAGNOSIS — H25813 Combined forms of age-related cataract, bilateral: Secondary | ICD-10-CM | POA: Diagnosis not present

## 2022-02-28 DIAGNOSIS — E119 Type 2 diabetes mellitus without complications: Secondary | ICD-10-CM | POA: Insufficient documentation

## 2022-02-28 LAB — POCT GLYCOSYLATED HEMOGLOBIN (HGB A1C): Hemoglobin A1C: 5.4 % (ref 4.0–5.6)

## 2022-02-28 MED ORDER — INSULIN GLARGINE 100 UNIT/ML SOLOSTAR PEN: 12.0000 [IU] | PEN_INJECTOR | SUBCUTANEOUS | 2 refills | Status: DC

## 2022-02-28 NOTE — Progress Notes (Shared)
?[facility] Note ? ?02/28/2022 ? ?  ? ?CHIEF COMPLAINT ?Patient presents for No chief complaint on file. ? ? ?HISTORY OF PRESENT ILLNESS: ?[name] is a 66 y.o. female who presents to the clinic today for:  ? ? ? ?Referring physician: ?Bernerd Limbo, MD ?[address] ?[address] ?[place],  [place] [address] ? ?HISTORICAL INFORMATION:  ? ?Selected notes from the [facility] ?Referred by Dr. Virgina Evener for PVD OD in pt with history of RT s/p laser retinopexy OD in 1985  ? ?CURRENT MEDICATIONS: ?Current Outpatient Medications (Ophthalmic Drugs)  ?Medication Sig  ? polyvinyl alcohol (LIQUIFILM TEARS) 1.4 % ophthalmic solution Place 1 drop into the left eye 2 (two) times daily as needed for dry eyes.  ? ?No current facility-administered medications for this visit. (Ophthalmic Drugs)  ? ?Current Outpatient Medications (Other)  ?Medication Sig  ? alprazolam (XANAX) 2 MG tablet Take 2 mg by mouth See admin instructions. Take 1 1/2 tablets (3 mg) by mouth daily at bedtime, may also take 1/2 tablet (1 mg) during the day as needed for anxiety  ? amoxicillin-clavulanate (AUGMENTIN) 875-125 MG tablet Take 1 tablet by mouth every 12 (twelve) hours.  ? aspirin EC 81 MG tablet Take 81 mg by mouth See admin instructions. Take one tablet (81 mg) by mouth every other night.  Swallow whole.  ? blood glucose meter kit and supplies KIT Dispense based on patient and insurance preference. Use up to four times daily as directed.  ? clobetasol ointment (TEMOVATE) 4.09 % Apply 1 application topically 2 (two) times daily as needed (itching/psoriasis).  ? Continuous Blood Gluc Receiver (FREESTYLE LIBRE 2 READER) DEVI USE AS DIRECTED  ? Continuous Blood Gluc Sensor (FREESTYLE LIBRE 2 SENSOR) MISC 1 Device by Does not apply route every 14 (fourteen) days.  ? HYDROcodone-acetaminophen (NORCO) 5-325 MG per tablet Take 1 tablet by mouth 2 (two) times daily as needed (pain).  ? hydrocortisone 2.5 %  cream Apply 1 application topically 2 (two) times daily. Apply to face  ? insulin glargine (LANTUS) 100 UNIT/ML Solostar Pen Inject 14 Units into the skin every morning.  ? metFORMIN (GLUCOPHAGE) 500 MG tablet Take 1 tablet (500 mg total) by mouth 2 (two) times daily with a meal.  ? nystatin (MYCOSTATIN) 100000 UNIT/ML suspension Take 5 mLs by mouth 4 (four) times daily. Swish and spit  ? polyethylene glycol (MIRALAX / GLYCOLAX) 17 g packet Take 17 g by mouth 2 (two) times daily. Mix in 8 oz liquid and drink  ? triamcinolone ointment (KENALOG) 0.1 % Apply 1 application topically 2 (two) times daily as needed (itching/psoriasis).  ? Vitamin D, Ergocalciferol, (DRISDOL) 1.25 MG (50000 UNIT) CAPS capsule Take 50,000 Units by mouth every Wednesday.  ? ?No current facility-administered medications for this visit. (Other)  ? ? ? ? ?REVIEW OF SYSTEMS: ? ? ? ? ?ALLERGIES ?Allergies  ?Allergen Reactions  ? Codeine Nausea Only  ? Diphenhydramine Hcl Other (See Comments)  ?  Makes heart race  ? Nasal Decongestant [Pseudoephedrine] Other (See Comments)  ?  Nervousness, insomnia  ? ? ?PAST MEDICAL HISTORY ?Past Medical History:  ?Diagnosis Date  ? Anxiety   ? Bipolar disorder ([place])   ? Chest pain   ? Myocardial Perfusion Study 02/08/02 - Abnormal study suggesting ischemia in anterior to anteroseptal wall to mid to upper mid ventricular region w/associated hypocontractibility. Clinical correlation is recommended. EF=46% (Dr. Corky Downs)  ? Cystic mesothelioma   ?  LARGE MESOTHELIAL CYST  ? Decreased pulse   ? Lower arterial duplex scan - 02/18/08 - NORMAL  ? Depression   ? 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 Mental disorder   ? Ovarian neoplasm   ? LARGE OVARIAN NEOPLASM  ? Palpitations   ? Cardiac CTA 03/31/08 - mild cardiomegaly. Small amount of pericardial fluid in the pericardial recess adjacent to the ascending aorta  ? Rectocele, female   ? SUI (stress urinary incontinence, female)   ? Tachycardia   ? ?Past Surgical History:  ?Procedure  Laterality Date  ? ABDOMINAL HYSTERECTOMY  1982  ? TAH  ? ANKLE SURGERY Right 1998  ? BACK SURGERY    ? CARDIAC CATHETERIZATION  03/15/02  ? No evidence of CAD  ? CHOLECYSTECTOMY  1998  ? KNEE SURGERY Right 1985  ? LAMINECTOMY    ? OOPHORECTOMY Left 1998  ? LSO - Benign tumor  ? POSTERIOR REPAIR  1985  ? ? ?FAMILY HISTORY ?Family History  ?Problem Relation Age of Onset  ? Diabetes Paternal Grandmother 93  ? Cancer Paternal Grandmother 23  ? Heart disease Paternal Grandfather   ? Heart attack Paternal Grandfather 53  ? Cancer Maternal Grandmother 49  ? Heart attack Maternal Grandfather 58  ? Heart defect Child   ? Diabetes Father   ? Heart disease Father   ? Heart attack Father   ? Lymphoma Maternal Uncle   ? Rheumatic fever Maternal Uncle   ? Heart attack Cousin 53  ? ? ?SOCIAL HISTORY ?Social History  ? ?Tobacco Use  ? Smoking status: Former  ?  Packs/day: 1.00  ?  Years: 23.00  ?  Pack years: 23.00  ?  Types: Cigarettes  ? Smokeless tobacco: Never  ?Vaping Use  ? Vaping Use: Every day  ? Substances: Nicotine  ?Substance Use Topics  ? Alcohol use: Yes  ?  Alcohol/week: 0.0 standard drinks  ?  Comment: occ  ? Drug use: No  ? ?  ? ?  ? ?OPHTHALMIC EXAM: ? ? ?Not recorded ?  ? ? ?IMAGING AND PROCEDURES  ?Imaging and Procedures for _0 @ ? ? ? ? ?  ?  ? ?  ?ASSESSMENT/PLAN: ? ?No diagnosis found. ? ? ?1,2. PVD / vitreous syneresis OD  ? - Discussed findings and prognosis ? - No RT or RD on 360 scleral depressed exam ? - Reviewed s/s of RT/RD ? - Strict return precautions for any such RT/RD signs/symptoms ? - f/u in 6 wks ? ?3. History of retinal tear OD ? - s/p laser ([place]) ? ?4. Mixed cataracts OU ? - The symptoms of cataract, surgical options, and treatments and risks were discussed with patient. ? - discussed diagnosis and progression ? - not yet visually significant ? - monitor for now ? ? ?Ophthalmic Meds Ordered this visit:  ?No orders of the defined types were placed in this encounter. ? ? ?  ? ?No  follow-ups on file. ? ?There are no Patient Instructions on file for this visit. ? ? ?Explained the diagnoses, plan, and follow up with the patient and they expressed understanding.  Patient expressed understanding of the importance of proper follow up care.  ? ?This document serves as a record of services personally performed by Gardiner Sleeper, MD, PhD. It was created on their behalf by Orvan Falconer, an ophthalmic technician. The creation of this record is the provider's dictation and/or activities during the visit.   ? ?Electronically signed by: Orvan Falconer, OA, 02/28/22  8:11 AM ? ? ?Gardiner Sleeper, M.D., Ph.D. ?Diseases & Surgery of the Retina and Vitreous ?[place] ? ?I have reviewed the above documentation for accuracy and completeness, and I agree with the above. Gardiner Sleeper, M.D., Ph.D. 04/09/20 8:11 AM ? ? ?Abbreviations: ?M myopia (nearsighted); A astigmatism; H hyperopia (farsighted); P presbyopia; Mrx spectacle prescription;  CTL contact lenses; OD right eye; OS left eye; OU both eyes  XT exotropia; ET esotropia; PEK punctate epithelial keratitis; PEE punctate epithelial erosions; DES dry eye syndrome; MGD meibomian gland dysfunction; ATs artificial tears; PFAT's preservative free artificial tears; [place] nuclear sclerotic cataract; PSC posterior subcapsular cataract; ERM epi-retinal membrane; PVD posterior vitreous detachment; RD retinal detachment; DM diabetes mellitus; DR diabetic retinopathy; NPDR non-proliferative diabetic retinopathy; PDR proliferative diabetic retinopathy; CSME clinically significant macular edema; DME diabetic macular edema; dbh dot blot hemorrhages; CWS cotton wool spot; POAG primary open angle glaucoma; C/D cup-to-disc ratio; HVF humphrey visual field; GVF goldmann visual field; OCT optical coherence tomography; IOP intraocular pressure; BRVO Branch retinal vein occlusion; CRVO central retinal vein occlusion; CRAO central retinal artery  occlusion; BRAO branch retinal artery occlusion; RT retinal tear; SB scleral buckle; PPV pars plana vitrectomy; VH Vitreous hemorrhage; PRP panretinal laser photocoagulation; IVK intravitreal kenalog; VMT v

## 2022-02-28 NOTE — Progress Notes (Signed)
? ?Subjective:  ? ? Patient ID: [name], female    DOB: [birth year], 66 y.o.   MRN: [id number] ? ?HPI ?Pt returns for f/u of diabetes mellitus:  ?DM type: Insulin-requiring type 2 (but she is prob evolving T1 (psoriasis and HONK); chronic pancreatitis could contribute).   ?Dx'ed: 2022 ?Complications: none.   ?Therapy: insulin since dx, and metformin.    ?GDM: never ?DKA: never ?Severe hypoglycemia: never.   ?Pancreatitis: never ?Pancreatic imaging: chronic pancreatitis.   ?SDOH: none ?Other: she uses FL2 CGM; She declines to reduce the insulin more than 2 units at a time.   ?Interval history: I reviewed continuous glucose monitor data.  Glucose varies from 65-125.  It increases slightly overnight, but there is no trend throughout the day.    ?Past Medical History:  ?Diagnosis Date  ? Anxiety   ? Bipolar disorder ([place])   ? Chest pain   ? Myocardial Perfusion Study 02/08/02 - Abnormal study suggesting ischemia in anterior to anteroseptal wall to mid to upper mid ventricular region w/associated hypocontractibility. Clinical correlation is recommended. EF=46% (Dr. Corky Downs)  ? Cystic mesothelioma   ? LARGE MESOTHELIAL CYST  ? Decreased pulse   ? Lower arterial duplex scan - 02/18/08 - NORMAL  ? Depression   ? Diabetes mellitus without complication ([place])   ? HSV infection 07/13/1979  ? Mental disorder   ? Ovarian neoplasm   ? LARGE OVARIAN NEOPLASM  ? Palpitations   ? Cardiac CTA 03/31/08 - mild cardiomegaly. Small amount of pericardial fluid in the pericardial recess adjacent to the ascending aorta  ? Rectocele, female   ? SUI (stress urinary incontinence, female)   ? Tachycardia   ? ? ?Past Surgical History:  ?Procedure Laterality Date  ? ABDOMINAL HYSTERECTOMY  1982  ? TAH  ? ANKLE SURGERY Right 1998  ? BACK SURGERY    ? CARDIAC CATHETERIZATION  03/15/02  ? No evidence of CAD  ? CHOLECYSTECTOMY  1998  ? KNEE SURGERY Right 1985  ? LAMINECTOMY    ? OOPHORECTOMY Left 1998  ? LSO - Benign tumor  ? POSTERIOR REPAIR  1985   ? ? ?Social History  ? ?Socioeconomic History  ? Marital status: Married  ?  Spouse name: Not on file  ? Number of children: Not on file  ? Years of education: Not on file  ? Highest education level: Not on file  ?Occupational History  ? Not on file  ?Tobacco Use  ? Smoking status: Former  ?  Packs/day: 1.00  ?  Years: 23.00  ?  Pack years: 23.00  ?  Types: Cigarettes  ? Smokeless tobacco: Never  ?Vaping Use  ? Vaping Use: Every day  ? Substances: Nicotine  ?Substance and Sexual Activity  ? Alcohol use: Yes  ?  Alcohol/week: 0.0 standard drinks  ?  Comment: occ  ? Drug use: No  ? Sexual activity: Yes  ?  Birth control/protection: Post-menopausal  ?  Comment: 1st intercourse 66 yo-More than 5 partners  ?Other Topics Concern  ? Not on file  ?Social History Narrative  ? Not on file  ? ?Social Determinants of Health  ? ?Financial Resource Strain: Not on file  ?Food Insecurity: Not on file  ?Transportation Needs: Not on file  ?Physical Activity: Not on file  ?Stress: Not on file  ?Social Connections: Not on file  ?Intimate Partner Violence: Not on file  ? ? ?Current Outpatient Medications on File Prior to Visit  ?Medication Sig Dispense Refill  ?  alprazolam (XANAX) 2 MG tablet Take 2 mg by mouth See admin instructions. Take 1 1/2 tablets (3 mg) by mouth daily at bedtime, may also take 1/2 tablet (1 mg) during the day as needed for anxiety    ? amoxicillin-clavulanate (AUGMENTIN) 875-125 MG tablet Take 1 tablet by mouth every 12 (twelve) hours. 14 tablet 0  ? aspirin EC 81 MG tablet Take 81 mg by mouth See admin instructions. Take one tablet (81 mg) by mouth every other night.  Swallow whole.    ? blood glucose meter kit and supplies KIT Dispense based on patient and insurance preference. Use up to four times daily as directed. 1 each 0  ? clobetasol ointment (TEMOVATE) 1.82 % Apply 1 application topically 2 (two) times daily as needed (itching/psoriasis).    ? Continuous Blood Gluc Receiver (FREESTYLE LIBRE 2 READER)  DEVI USE AS DIRECTED 1 each 1  ? Continuous Blood Gluc Sensor (FREESTYLE LIBRE 2 SENSOR) MISC 1 Device by Does not apply route every 14 (fourteen) days. 6 each 3  ? HYDROcodone-acetaminophen (NORCO) 5-325 MG per tablet Take 1 tablet by mouth 2 (two) times daily as needed (pain).    ? hydrocortisone 2.5 % cream Apply 1 application topically 2 (two) times daily. Apply to face    ? metFORMIN (GLUCOPHAGE) 500 MG tablet Take 1 tablet (500 mg total) by mouth 2 (two) times daily with a meal. 60 tablet 2  ? nystatin (MYCOSTATIN) 100000 UNIT/ML suspension Take 5 mLs by mouth 4 (four) times daily. Swish and spit    ? polyethylene glycol (MIRALAX / GLYCOLAX) 17 g packet Take 17 g by mouth 2 (two) times daily. Mix in 8 oz liquid and drink    ? polyvinyl alcohol (LIQUIFILM TEARS) 1.4 % ophthalmic solution Place 1 drop into the left eye 2 (two) times daily as needed for dry eyes.    ? triamcinolone ointment (KENALOG) 0.1 % Apply 1 application topically 2 (two) times daily as needed (itching/psoriasis).    ? Vitamin D, Ergocalciferol, (DRISDOL) 1.25 MG (50000 UNIT) CAPS capsule Take 50,000 Units by mouth every Wednesday.    ? ?No current facility-administered medications on file prior to visit.  ? ? ?Allergies  ?Allergen Reactions  ? Codeine Nausea Only  ? Diphenhydramine Hcl Other (See Comments)  ?  Makes heart race  ? Nasal Decongestant [Pseudoephedrine] Other (See Comments)  ?  Nervousness, insomnia  ? ? ?Family History  ?Problem Relation Age of Onset  ? Diabetes Paternal Grandmother 50  ? Cancer Paternal Grandmother 92  ? Heart disease Paternal Grandfather   ? Heart attack Paternal Grandfather 82  ? Cancer Maternal Grandmother 59  ? Heart attack Maternal Grandfather 58  ? Heart defect Child   ? Diabetes Father   ? Heart disease Father   ? Heart attack Father   ? Lymphoma Maternal Uncle   ? Rheumatic fever Maternal Uncle   ? Heart attack Cousin 63  ? ? ?BP 122/70 (BP Location: Left Arm, Patient Position: Sitting, Cuff Size:  Normal)   Pulse 90   Ht _0  (1.727 m)   Wt 160 lb 3.2 oz (72.7 kg)   SpO2 98%   BMI 24.36 kg/m?  ? ? ?Review of Systems ? ?   ?Objective:  ? Physical Exam ?VITAL SIGNS:  See vs page ?GENERAL: no distress ? ? ? ?Lab Results  ?Component Value Date  ? HGBA1C 5.4 02/28/2022  ? ?   ?Assessment & Plan:  ?Insulin-requiring type 2 DM:  overcontrolled ?Hypoglycemia, due to insulin: this limits aggressiveness of glycemic control.  ? ?Patient Instructions  ?check your blood sugar twice a day.  vary the time of day when you check, between before the 3 meals, and at bedtime.  also check if you have symptoms of your blood sugar being too high or too low.  please keep a record of the readings and bring it to your next appointment here (or you can bring the meter itself).  You can write it on any piece of paper.  please call us sooner if your blood sugar goes below 70, or if most of your readings are over 200.   ?Please reduce the Lantus to 12 units each morning, and:   ?Please continue the same metformin.   ?You should have an endocrinology follow-up appointment in 3 months.   ? ? ? ?

## 2022-02-28 NOTE — Patient Instructions (Addendum)
check your blood sugar twice a day.  vary the time of day when you check, between before the 3 meals, and at bedtime.  also check if you have symptoms of your blood sugar being too high or too low.  please keep a record of the readings and bring it to your next appointment here (or you can bring the meter itself).  You can write it on any piece of paper.  please call us sooner if your blood sugar goes below 70, or if most of your readings are over 200.   ?Please reduce the Lantus to 12 units each morning, and:   ?Please continue the same metformin.   ?You should have an endocrinology follow-up appointment in 3 months.   ? ?

## 2022-02-28 NOTE — Progress Notes (Signed)
?[facility] Note ? ?02/28/2022 ? ?  ? ?CHIEF COMPLAINT ?Patient presents for Flashes/floaters and Diabetic Eye Exam ? ? ?HISTORY OF PRESENT ILLNESS: ?[name] is a 66 y.o. female who presents to the clinic today for:  ? ?HPI   ? ? Flashes/floaters   ?In both eyes.  This started days ago.  Duration of days.  Duration Constant.  Characterized as spots.  Since onset it is stable.  Associated Symptoms Flashes and Floaters.  Context: recent eye surgery (1985 laser OD).  I, the attending physician,  performed the HPI with the patient and updated documentation appropriately. ? ?  ?  ? ? Diabetic Eye Exam   ?Associated Symptoms Flashes and Floaters.  Diabetes characteristics include Type 2, controlled with diet, on insulin and taking oral medications.  This started 1 year ago.  Blood sugar level is controlled.  Last Blood Glucose 91.  Last A1C 5.3.  I, the attending physician,  performed the HPI with the patient and updated documentation appropriately. ? ?  ?  ? ? Comments   ?Patient states that the left started seeing a yellow/green light yesterday. A1c was 13 last year, down to 5.3. ? ?  ?  ?Last edited by Bernarda Caffey, MD on 02/28/2022  1:21 PM.  ?  ? ?Patient states started seeing yellow/green light OS yesterday. ? ?Referring physician: ?Bernerd Limbo, MD ?[address] ?[address] ?[place],  [place] [address] ? ?HISTORICAL INFORMATION:  ? ?Selected notes from the [facility] ?Referred by Dr. Virgina Evener for PVD OD in pt with history of RT s/p laser retinopexy OD in 1985  ? ?CURRENT MEDICATIONS: ?Current Outpatient Medications (Ophthalmic Drugs)  ?Medication Sig  ? polyvinyl alcohol (LIQUIFILM TEARS) 1.4 % ophthalmic solution Place 1 drop into the left eye 2 (two) times daily as needed for dry eyes.  ? ?No current facility-administered medications for this visit. (Ophthalmic Drugs)  ? ?Current Outpatient Medications (Other)  ?Medication Sig  ? alprazolam (XANAX) 2 MG  tablet Take 2 mg by mouth See admin instructions. Take 1 1/2 tablets (3 mg) by mouth daily at bedtime, may also take 1/2 tablet (1 mg) during the day as needed for anxiety  ? amoxicillin-clavulanate (AUGMENTIN) 875-125 MG tablet Take 1 tablet by mouth every 12 (twelve) hours.  ? aspirin EC 81 MG tablet Take 81 mg by mouth See admin instructions. Take one tablet (81 mg) by mouth every other night.  Swallow whole.  ? blood glucose meter kit and supplies KIT Dispense based on patient and insurance preference. Use up to four times daily as directed.  ? clobetasol ointment (TEMOVATE) 8.65 % Apply 1 application topically 2 (two) times daily as needed (itching/psoriasis).  ? Continuous Blood Gluc Receiver (FREESTYLE LIBRE 2 READER) DEVI USE AS DIRECTED  ? Continuous Blood Gluc Sensor (FREESTYLE LIBRE 2 SENSOR) MISC 1 Device by Does not apply route every 14 (fourteen) days.  ? HYDROcodone-acetaminophen (NORCO) 5-325 MG per tablet Take 1 tablet by mouth 2 (two) times daily as needed (pain).  ? hydrocortisone 2.5 % cream Apply 1 application topically 2 (two) times daily. Apply to face  ? insulin glargine (LANTUS) 100 UNIT/ML Solostar Pen Inject 14 Units into the skin every morning.  ? metFORMIN (GLUCOPHAGE) 500 MG tablet Take 1 tablet (500 mg total) by mouth 2 (two) times daily with a meal.  ? nystatin (MYCOSTATIN) 100000 UNIT/ML suspension Take 5 mLs by mouth 4 (four) times daily. Swish and  spit  ? polyethylene glycol (MIRALAX / GLYCOLAX) 17 g packet Take 17 g by mouth 2 (two) times daily. Mix in 8 oz liquid and drink  ? triamcinolone ointment (KENALOG) 0.1 % Apply 1 application topically 2 (two) times daily as needed (itching/psoriasis).  ? Vitamin D, Ergocalciferol, (DRISDOL) 1.25 MG (50000 UNIT) CAPS capsule Take 50,000 Units by mouth every Wednesday.  ? ?No current facility-administered medications for this visit. (Other)  ? ?REVIEW OF SYSTEMS: ?ROS   ?Positive for: Endocrine, Eyes ?Negative for: Constitutional,  Gastrointestinal, Neurological, Skin, Genitourinary, Musculoskeletal, HENT, Cardiovascular, Respiratory, Psychiatric, Allergic/Imm, Heme/Lymph ?Last edited by Roselee Nova D, COT on 02/28/2022 10:47 AM.  ?  ? ?ALLERGIES ?Allergies  ?Allergen Reactions  ? Codeine Nausea Only  ? Diphenhydramine Hcl Other (See Comments)  ?  Makes heart race  ? Nasal Decongestant [Pseudoephedrine] Other (See Comments)  ?  Nervousness, insomnia  ? ?PAST MEDICAL HISTORY ?Past Medical History:  ?Diagnosis Date  ? Anxiety   ? Bipolar disorder ([place])   ? Chest pain   ? Myocardial Perfusion Study 02/08/02 - Abnormal study suggesting ischemia in anterior to anteroseptal wall to mid to upper mid ventricular region w/associated hypocontractibility. Clinical correlation is recommended. EF=46% (Dr. Corky Downs)  ? Cystic mesothelioma   ? LARGE MESOTHELIAL CYST  ? Decreased pulse   ? Lower arterial duplex scan - 02/18/08 - NORMAL  ? Depression   ? Diabetes mellitus without complication ([place])   ? HSV infection 07/13/1979  ? Mental disorder   ? Ovarian neoplasm   ? LARGE OVARIAN NEOPLASM  ? Palpitations   ? Cardiac CTA 03/31/08 - mild cardiomegaly. Small amount of pericardial fluid in the pericardial recess adjacent to the ascending aorta  ? Rectocele, female   ? SUI (stress urinary incontinence, female)   ? Tachycardia   ? ?Past Surgical History:  ?Procedure Laterality Date  ? ABDOMINAL HYSTERECTOMY  1982  ? TAH  ? ANKLE SURGERY Right 1998  ? BACK SURGERY    ? CARDIAC CATHETERIZATION  03/15/02  ? No evidence of CAD  ? CHOLECYSTECTOMY  1998  ? KNEE SURGERY Right 1985  ? LAMINECTOMY    ? OOPHORECTOMY Left 1998  ? LSO - Benign tumor  ? [place]  ? ?FAMILY HISTORY ?Family History  ?Problem Relation Age of Onset  ? Diabetes Paternal Grandmother 9  ? Cancer Paternal Grandmother 63  ? Heart disease Paternal Grandfather   ? Heart attack Paternal Grandfather 51  ? Cancer Maternal Grandmother 110  ? Heart attack Maternal Grandfather 58  ? Heart defect  Child   ? Diabetes Father   ? Heart disease Father   ? Heart attack Father   ? Lymphoma Maternal Uncle   ? Rheumatic fever Maternal Uncle   ? Heart attack Cousin 53  ? ?SOCIAL HISTORY ?Social History  ? ?Tobacco Use  ? Smoking status: Former  ?  Packs/day: 1.00  ?  Years: 23.00  ?  Pack years: 23.00  ?  Types: Cigarettes  ? Smokeless tobacco: Never  ?Vaping Use  ? Vaping Use: Every day  ? Substances: Nicotine  ?Substance Use Topics  ? Alcohol use: Yes  ?  Alcohol/week: 0.0 standard drinks  ?  Comment: occ  ? Drug use: No  ?  ? ?  ?OPHTHALMIC EXAM: ? ? ?Base Eye Exam   ? ? Visual Acuity (Snellen - Linear)   ? ?   Right Left  ? Dist cc 20/30 -2 20/25 -1  ?  Dist ph cc NI   ? ? Correction: Glasses  ? ?  ?  ? ? Tonometry (Tonopen, 10:12 AM)   ? ?   Right Left  ? Pressure 17 19  ? ?  ?  ? ? Pupils   ? ?   Pupils Dark Light Shape React APD  ? Right PERRL 2 1 Round Sluggish None  ? Left PERRL 2 1 Round Sluggish None  ? ?  ?  ? ? Visual Fields   ? ?   Left Right  ?  Full Full  ? ?  ?  ? ? Extraocular Movement   ? ?   Right Left  ?  Full Full  ? ?  ?  ? ? Neuro/Psych   ? ? Oriented x3: Yes  ? Mood/Affect: Normal  ? ?  ?  ? ? Dilation   ? ? Both eyes: 2.5% Phenylephrine, 1.0% Mydriacyl @ 10:09 AM  ? ?  ?  ? ?  ? ?Slit Lamp and Fundus Exam   ? ? Slit Lamp Exam   ? ?   Right Left  ? Lids/Lashes Dermatochalasis - upper lid Dermatochalasis - upper lid, mild Meibomian gland dysfunction  ? Conjunctiva/Sclera White and quiet White and quiet  ? Cornea Arcus, trace Debris in tear film, trace Punctate epithelial erosions Arcus, trace Debris in tear film, trace Punctate epithelial erosions  ? Anterior Chamber Deep and quiet Deep and quiet  ? Iris Round and dilated Round and dilated  ? Lens 2+ Nuclear sclerosis, 2+ Cortical cataract 2+ Nuclear sclerosis, 2+ Cortical cataract  ? Anterior Vitreous Vitreous syneresis, Posterior vitreous detachment Vitreous syneresis, + PVD  ? ?  ?  ? ? Fundus Exam   ? ?   Right Left  ? Disc Pink and Sharp,  mild, temporal PPA Pink and Sharp, +PPP, +cupping  ? C/D Ratio 0.5 0.6  ? Macula Flat, Good foveal reflex, mild Retinal pigment epithelial mottling, No heme or edema Flat, Good foveal reflex, mild Retinal pigment e

## 2022-03-04 ENCOUNTER — Telehealth: Payer: Self-pay

## 2022-03-04 ENCOUNTER — Other Ambulatory Visit: Payer: Self-pay | Admitting: Endocrinology

## 2022-03-04 DIAGNOSIS — E119 Type 2 diabetes mellitus without complications: Secondary | ICD-10-CM

## 2022-03-04 NOTE — Telephone Encounter (Signed)
Patient LVM and forgot to mention during her appt that Dr. Arville Care would like for her thyroid levels to be checked. Patient would like to know if she can still have these labs done ?

## 2022-03-05 ENCOUNTER — Other Ambulatory Visit (INDEPENDENT_AMBULATORY_CARE_PROVIDER_SITE_OTHER): Payer: HMO

## 2022-03-05 DIAGNOSIS — E119 Type 2 diabetes mellitus without complications: Secondary | ICD-10-CM

## 2022-03-05 DIAGNOSIS — Z794 Long term (current) use of insulin: Secondary | ICD-10-CM | POA: Diagnosis not present

## 2022-03-05 LAB — TSH: TSH: 0.4 u[IU]/mL (ref 0.35–5.50)

## 2022-03-05 LAB — T4, FREE: Free T4: 0.82 ng/dL (ref 0.60–1.60)

## 2022-03-05 NOTE — Telephone Encounter (Signed)
Patient says that she will come get her labs later on today. ?

## 2022-03-08 ENCOUNTER — Telehealth: Payer: Self-pay | Admitting: Endocrinology

## 2022-03-08 ENCOUNTER — Other Ambulatory Visit: Payer: Self-pay

## 2022-03-08 DIAGNOSIS — E119 Type 2 diabetes mellitus without complications: Secondary | ICD-10-CM

## 2022-03-08 MED ORDER — FREESTYLE LIBRE 2 READER DEVI: 1 refills | Status: DC

## 2022-03-08 NOTE — Telephone Encounter (Signed)
Patient called to advise that she uses the [employer] 2 reader and hers has been recalled by [employer]. Patient is requesting a new RX for a [employer] 2 reader and cord to be sent to the [facility] on [address] [place] . ? ?Patients call back number is [phone] ?

## 2022-03-08 NOTE — Telephone Encounter (Signed)
RX now sent and patient has been informed ?

## 2022-03-12 ENCOUNTER — Encounter: Payer: HMO | Attending: Family Medicine | Admitting: Dietician

## 2022-03-12 DIAGNOSIS — Z794 Long term (current) use of insulin: Secondary | ICD-10-CM | POA: Diagnosis present

## 2022-03-12 DIAGNOSIS — E119 Type 2 diabetes mellitus without complications: Secondary | ICD-10-CM | POA: Diagnosis present

## 2022-03-12 NOTE — Progress Notes (Signed)
Diabetes Self-Management Education ? ?Visit Type: Follow-up ? ?Appt. Start Time: 1515 Appt. End Time: 9678 ? ?03/12/2022 ? ?[name], identified by name and date of birth, is a 66 y.o. female with a diagnosis of Diabetes:  .  ? ?ASSESSMENT ?Pt reports being very happy using their Libre 2 CGM. Pt has been watching how their food choices effect their glucose. Pt reports high fiber carbs minimally raise their blood sugar.  ?Pt has lost 2 lbs in the last 2 weeks. Pt reports 5 hypoglycemic events in the last week, mostly at night. Pt 90 day Time in [employer] is 98%, states their reader was ~20 mg/dL ?Pt has begun having only half an apple with a small amount of PB, or a tortilla with chocolate sauce in the evening before bed. Pt states this has helped with dawn phenomenon some. ?Pt endocrinologist has lowered their dose of Lantus from 14 to 12 units QD. ?Pt reports trying to eat 45-60 g of carbs daily. ?There were no vitals taken for this visit. ?There is no height or weight on file to calculate BMI. ? ? Diabetes Self-Management Education - 03/12/22 1747   ? ?  ? Visit Information  ? Visit Type Follow-up   ?  ? Complications  ? How often do you check your blood sugar? > 4 times/day   CGM  ? Fasting Blood glucose range (mg/dL) 70-129   ? Postprandial Blood glucose range (mg/dL) 130-179;70-129   ? Number of hypoglycemic episodes per month 9   ? Can you tell when your blood sugar is low? Yes   ? What do you do if your blood sugar is low? Eat some carbs   ? Number of hyperglycemic episodes ( >'200mg'$ /dL): Never   ?  ? Activity / Exercise  ? Activity / Exercise Type ADL's   ?  ? Individualized Goals (developed by patient)  ? Nutrition Follow meal plan discussed;Carb counting   increase carbs to 90g per day  ? Monitoring  Consistenly use CGM   ?  ? Patient Self-Evaluation of Goals - Patient rates self as meeting previously set goals (% of time)  ? Nutrition 50 - 75 % (half of the time)   ? Physical Activity 25 - 50%  (sometimes)   ? Medications >75% (most of the time)   ? Monitoring >75% (most of the time)   ? Problem Solving and behavior change strategies  25 - 50% (sometimes)   ? Reducing Risk (treating acute and chronic complications) 25 - 93% (sometimes)   ? Health Coping 25 - 50% (sometimes)   ?  ? Post-Education Assessment  ? Patient understands incorporating nutritional management into lifestyle. Comprehends key points   ? Patient undertands incorporating physical activity into lifestyle. Comprehends key points   ? Patient understands using medications safely. Demonstrates understanding / competency   ? Patient understands monitoring blood glucose, interpreting and using results Comprehends key points   Needs to AVOID hypoglycemia  ? Patient understands prevention, detection, and treatment of acute complications. Comprehends key points   ? Patient understands prevention, detection, and treatment of chronic complications. Demonstrates understanding / competency   ? Patient understands how to develop strategies to address psychosocial issues. Comprehends key points   ? Patient understands how to develop strategies to promote health/change behavior. Comprehends key points   ?  ? Outcomes  ? Expected Outcomes Demonstrated interest in learning but significant barriers to change   Focus on weight loss, consistent hypoglycemia  ? Future  DMSE PRN   ? Program Status Not Completed   ?  ? Subsequent Visit  ? Since your last visit have you continued or begun to take your medications as prescribed? Yes   ? Since your last visit have you had your blood pressure checked? No   ? Since your last visit have you experienced any weight changes? Loss   ? Weight Loss (lbs) 4   ? Since your last visit, are you checking your blood glucose at least once a day? Yes   Libre 2 CGM  ? ?  ?  ? ?  ? ? ?Individualized Plan for Diabetes Self-Management Training:  ? ?Learning Objective:  Patient will have a greater understanding of diabetes  self-management. ?Patient education plan is to attend individual and/or group sessions per assessed needs and concerns. ?  ?Plan:  ? ?Patient Instructions  ?Look into Smart Balance plant based butter to mix with your cocoa powder. ? ?Continue to work on reaching a daily goal of at least 25g of fiber per day. ? ?Talk to your doctor about looking into lowering your dose of insulin ? ?Do not be worried if a meal takes you up to 160 mg/dL.  ? ?Check your blood sugar each morning before eating or drinking (fasting). ?Look for numbers between 70-100 mg/dL ?Check your blood sugar 2 hours after you begin eating a meal. ?Look for numbers under 160 mg/dL. ? ?Continue your weight loss at a rate of ~2 pounds in a month. ? ?Aim for 60 - 90 g of carbs per day, spread out evenly. ? ? ?Expected Outcomes:  Demonstrated interest in learning but significant barriers to change (Focus on weight loss, consistent hypoglycemia) ? ?If problems or questions, patient to contact team via:  Phone and Email ? ?Future DSME appointment: PRN ?

## 2022-03-12 NOTE — Patient Instructions (Addendum)
Look into Smart Balance plant based butter to mix with your cocoa powder. ? ?Continue to work on reaching a daily goal of at least 25g of fiber per day. ? ?Talk to your doctor about looking into lowering your dose of insulin ? ?Do not be worried if a meal takes you up to 160 mg/dL.  ? ?Check your blood sugar each morning before eating or drinking (fasting). ?Look for numbers between 70-100 mg/dL ?Check your blood sugar 2 hours after you begin eating a meal. ?Look for numbers under 160 mg/dL. ? ?Continue your weight loss at a rate of ~2 pounds in a month. ? ?Aim for 60 - 90 g of carbs per day, spread out evenly. ? ?

## 2022-03-30 ENCOUNTER — Other Ambulatory Visit: Payer: Self-pay

## 2022-03-30 ENCOUNTER — Encounter (HOSPITAL_COMMUNITY): Payer: Self-pay

## 2022-03-30 ENCOUNTER — Emergency Department (HOSPITAL_COMMUNITY)
Admission: EM | Admit: 2022-03-30 | Discharge: 2022-03-30 | Disposition: A | Discharge status: Home / Self Care | Payer: HMO | Attending: Emergency Medicine | Admitting: Emergency Medicine

## 2022-03-30 DIAGNOSIS — Z8543 Personal history of malignant neoplasm of ovary: Secondary | ICD-10-CM | POA: Diagnosis not present

## 2022-03-30 DIAGNOSIS — K5900 Constipation, unspecified: Secondary | ICD-10-CM | POA: Diagnosis present

## 2022-03-30 DIAGNOSIS — K640 First degree hemorrhoids: Secondary | ICD-10-CM | POA: Diagnosis not present

## 2022-03-30 DIAGNOSIS — Z794 Long term (current) use of insulin: Secondary | ICD-10-CM | POA: Insufficient documentation

## 2022-03-30 DIAGNOSIS — E119 Type 2 diabetes mellitus without complications: Secondary | ICD-10-CM | POA: Insufficient documentation

## 2022-03-30 DIAGNOSIS — Z7982 Long term (current) use of aspirin: Secondary | ICD-10-CM | POA: Diagnosis not present

## 2022-03-30 LAB — CBG MONITORING, ED: Glucose-Capillary: 90 mg/dL (ref 70–99)

## 2022-03-30 MED ORDER — ONDANSETRON HCL 4 MG/2ML IJ SOLN
4.0000 mg | Freq: Once | INTRAMUSCULAR | Status: AC
  Administered 2022-03-30: 4 mg via INTRAVENOUS
  Filled 2022-03-30: qty 2

## 2022-03-30 MED ORDER — HYDROCORTISONE (PERIANAL) 2.5 % EX CREA: 1.0000 "application " | TOPICAL_CREAM | Freq: Two times a day (BID) | CUTANEOUS | 0 refills | Status: AC

## 2022-03-30 MED ORDER — SORBITOL 70 % SOLN
960.0000 mL | TOPICAL_OIL | Freq: Once | ORAL | Status: AC
  Administered 2022-03-30: 960 mL via RECTAL
  Filled 2022-03-30: qty 473

## 2022-03-30 MED ORDER — FENTANYL CITRATE PF 50 MCG/ML IJ SOSY
50.0000 ug | PREFILLED_SYRINGE | Freq: Once | INTRAMUSCULAR | Status: AC
  Administered 2022-03-30: 50 ug via INTRAVENOUS
  Filled 2022-03-30: qty 1

## 2022-03-30 NOTE — ED Notes (Signed)
Pt had large bowel movement after enema

## 2022-03-30 NOTE — ED Provider Notes (Signed)
[facility] EMERGENCY DEPARTMENT Provider Note   CSN: [id number] Arrival date & time: 03/30/22  3748     History  Chief Complaint  Patient presents with   Constipation   Fecal Impaction    [name] is a 66 y.o. 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  Presents emerged department with a chief plaint of constipation.  Patient reports that she takes opiate pain medication daily however does take MiraLAX on a regular basis.  Patient has been constipated for the last 2 days.  Patient reports that she attempted a laxatives on top of her MiraLAX yesterday with no improvement in her constipation.  Patient reports that she woke up this morning with rectal pain.  Patient attempted to have a bowel movement this morning with no relief of constipation.  Patient rates rectal pain 10/10 on the pain scale.  Is concern for a fecal impaction.  Denies any fever, chills, abdominal pain, nausea, vomiting, diarrhea, blood in stool, melena, dysuria, hematuria, urinary urgency, vaginal pain, vaginal bleeding, vaginal discharge.     Constipation Associated symptoms: no abdominal pain, no back pain, no diarrhea, no dysuria, no fever, no nausea and no vomiting       Home Medications Prior to Admission medications   Medication Sig Start Date End Date Taking? Authorizing Provider  alprazolam Duanne Moron) 2 MG tablet Take 2 mg by mouth See admin instructions. Take 1 1/2 tablets (3 mg) by mouth daily at bedtime, may also take 1/2 tablet (1 mg) during the day as needed for anxiety    [provider]  amoxicillin-clavulanate (AUGMENTIN) 875-125 MG tablet Take 1 tablet by mouth every 12 (twelve) hours. 01/10/22   Raspet, Derry Skill, PA-C  aspirin EC 81 MG tablet Take 81 mg by mouth See admin instructions. Take one tablet (81 mg) by  mouth every other night.  Swallow whole.    [provider]  blood glucose meter kit and supplies KIT Dispense based on patient and insurance preference. Use up to four times daily as directed. 03/13/21   Oswald Hillock, MD  clobetasol ointment (TEMOVATE) 2.70 % Apply 1 application topically 2 (two) times daily as needed (itching/psoriasis). 11/22/20   [provider]  Continuous Blood Gluc Receiver (FREESTYLE LIBRE 2 READER) DEVI USE AS DIRECTED 03/08/22   Renato Shin, MD  Continuous Blood Gluc Sensor (FREESTYLE LIBRE 2 SENSOR) MISC 1 Device by Does not apply route every 14 (fourteen) days. 01/09/22   Renato Shin, MD  HYDROcodone-acetaminophen (NORCO) 5-325 MG per tablet Take 1 tablet by mouth 2 (two) times daily as needed (pain).    [provider]  hydrocortisone 2.5 % cream Apply 1 application topically 2 (two) times daily. Apply to face 02/16/19   [provider]  insulin glargine (LANTUS) 100 UNIT/ML Solostar Pen Inject 12 Units into the skin every morning. 02/28/22   Renato Shin, MD  metFORMIN (GLUCOPHAGE) 500 MG tablet Take 1 tablet (500 mg total) by mouth 2 (two) times daily with a meal. 03/13/21   Oswald Hillock, MD  nystatin (MYCOSTATIN) 100000 UNIT/ML suspension Take 5 mLs by mouth 4 (four) times daily. Swish and spit 03/09/21   [provider]  polyethylene glycol (MIRALAX / GLYCOLAX) 17 g packet Take 17 g by mouth 2 (two) times daily. Mix in 8 oz liquid and drink    [provider]  polyvinyl alcohol (  LIQUIFILM TEARS) 1.4 % ophthalmic solution Place 1 drop into the left eye 2 (two) times daily as needed for dry eyes.    [provider]  triamcinolone ointment (KENALOG) 0.1 % Apply 1 application topically 2 (two) times daily as needed (itching/psoriasis). 09/30/16   [provider]  Vitamin D, Ergocalciferol, (DRISDOL) 1.25 MG (50000 UNIT) CAPS capsule Take 50,000 Units by mouth every Wednesday.    [provider]       Allergies    Codeine, Diphenhydramine hcl, and Nasal decongestant [pseudoephedrine]    Review of Systems   Review of Systems  Constitutional:  Negative for chills and fever.  Gastrointestinal:  Positive for constipation and rectal pain. Negative for abdominal distention, abdominal pain, anal bleeding, blood in stool, diarrhea, nausea and vomiting.  Genitourinary:  Negative for difficulty urinating, dysuria, flank pain, frequency, genital sores, hematuria, urgency, vaginal bleeding, vaginal discharge and vaginal pain.  Musculoskeletal:  Negative for back pain and neck pain.  Skin:  Negative for color change and rash.  Neurological:  Negative for dizziness, syncope, light-headedness and headaches.  Psychiatric/Behavioral:  Negative for confusion.    Physical Exam Updated Vital Signs BP (!) 142/91   Pulse 88   Temp 97.8 F (36.6 C)   Resp 17   SpO2 99%  Physical Exam Vitals and nursing note reviewed. Exam conducted with a chaperone present (Female nurse tech present as chaperone).  Constitutional:      General: She is not in acute distress.    Appearance: She is not ill-appearing, toxic-appearing or diaphoretic.  HENT:     Head: Normocephalic.  Eyes:     General: No scleral icterus.       Right eye: No discharge.        Left eye: No discharge.  Cardiovascular:     Rate and Rhythm: Normal rate.  Pulmonary:     Effort: Pulmonary effort is normal.  Abdominal:     General: Abdomen is flat. Bowel sounds are normal. There is no distension. There are no signs of injury.     Palpations: Abdomen is soft. There is no mass or pulsatile mass.     Tenderness: There is no abdominal tenderness. There is no guarding or rebound.     Hernia: There is no hernia in the umbilical area or ventral area.  Genitourinary:    Rectum: Tenderness and external hemorrhoid present. No mass, anal fissure or internal hemorrhoid. Normal anal tone.     Comments: External hemorrhoid noted at 6 o'clock  position, no thrombosis.  Patient has tenderness near her external hemorrhoid.  Stool noted at the upper end of rectal vault, stool appears soft in nature.  No fecal impaction within rectal vault noted.  Stool is light brown in color with no frank red blood or melena.  No anal fissures noted. Skin:    General: Skin is warm and dry.  Neurological:     General: No focal deficit present.     Mental Status: She is alert.  Psychiatric:        Behavior: Behavior is cooperative.    ED Results / Procedures / Treatments   Labs (all labs ordered are listed, but only abnormal results are displayed) Labs Reviewed - No data to display  EKG None  Radiology No results found.  Procedures Procedures    Medications Ordered in ED Medications  fentaNYL (SUBLIMAZE) injection 50 mcg (50 mcg Intravenous Given 03/30/22 0915)  ondansetron (ZOFRAN) injection 4 mg (4 mg Intravenous Given 03/30/22  Pauli.Sours)    ED Course/ Medical Decision Making/ A&P                           Medical Decision Making Risk Prescription drug management.   Alert 66 year old female no acute distress, nontoxic-appearing.  Pain aeration does appear uncomfortable due to complaints of rectal pain.  Presents to the ED with a chief complaint of constipation and rectal pain.  Information is obtained from patient.  Past medical records were reviewed including previous prior notes, labs, and imaging.  Patient has medical history as outlined in HPI which complicates her care.  Due to reports of constipation and rectal pain there was concern for fecal impaction.  I performed a manual disimpaction at bedside, there was stool noted to the upper end of patient's rectal vault however it appeared soft in nature.  No hard stool was able to be removed from rectal vault.  Will attempt smog enema at this time and reassess patient afterwards.  Patient was given fentanyl for pain management with improvement in her pain.  Patient had large bowel  movement after receiving smog enema.  Reports resolution of rectal pain at this time.  Abdomen soft, nondistended, nontender with no guarding or rebound tenderness.  Patient hemodynamically stable.  Will discharge patient at this time to follow-up with gastroenterology as needed.  Patient given prescription for Anusol due to hemorrhoid.  Based on patient's chief complaint, I considered admission might be necessary, however after reassuring ED workup feel patient is reasonable for discharge.  Discussed results, findings, treatment and follow up. Patient advised of return precautions. Patient verbalized understanding and agreed with plan.  Portions of this note were generated with [employer]. Dictation errors may occur despite best attempts at proofreading.         Final Clinical Impression(s) / ED Diagnoses Final diagnoses:  Constipation, unspecified constipation type  Grade I hemorrhoids    Rx / DC Orders ED Discharge Orders          Ordered    hydrocortisone (ANUSOL-HC) 2.5 % rectal cream  2 times daily        03/30/22 [address] [place] 03/30/22 1144    Tegeler, Gwenyth Allegra, MD 03/30/22 1356

## 2022-03-30 NOTE — Discharge Instructions (Addendum)
You came to the Emergency Department today to be evaluated for your constipation and rectal pain.  You were found to have a hemorrhoid which may have been causing your rectal pain.  Your constipation resolved after receiving an enema.  Please follow-up with your gastroenterologist if you have any further concerns.    Due to your hemorrhoid I have placed a order for Anusol, please take this medication as prescribed.  Additionally you may perform sitz bath's as indicated on your discharge paperwork.  Get help right away if: You have a fever and your symptoms suddenly get worse. You leak stool or have blood in your stool. Your abdomen is bloated. You have severe pain in your abdomen. You feel dizzy or you faint.

## 2022-03-30 NOTE — ED Triage Notes (Signed)
Pt arrives via EMS. Pt reports constipation over the past few days. Pt states she attempted to have a BM this morning with no success.

## 2022-04-12 NOTE — Progress Notes (Signed)
[facility] Note  04/15/2022     CHIEF COMPLAINT Patient presents for Flashes/floaters   HISTORY OF PRESENT ILLNESS: [name] is a 66 y.o. female who presents to the clinic today for:   HPI     Flashes/floaters   In both eyes.  This started days ago.  Duration of days.  Duration Constant.  Characterized as spots.  Since onset it is stable.  Associated Symptoms Flashes and Floaters.  Context: recent eye surgery (1985 laser OD).  I, the attending physician,  performed the HPI with the patient and updated documentation appropriately.        Comments   Patient denies noticing any vision changes at this time. She states that the still sees flashes and floaters. Her blood sugar was 108 and her A1C is 5.4.      Last edited by Bernarda Caffey, MD on 04/15/2022  1:45 PM.    Patient states her fol and floaters have not gotten any better, but they have not gotten worse either  Referring physician: Bernerd Limbo, MD [address] [place],  [place] [id number]  HISTORICAL INFORMATION:   Selected notes from the MEDICAL RECORD NUMBER Referred by Dr. Virgina Evener for PVD OD in pt with history of RT s/p laser retinopexy OD in [place]: Current Outpatient Medications (Ophthalmic Drugs)  Medication Sig   polyvinyl alcohol (LIQUIFILM TEARS) 1.4 % ophthalmic solution Place 1 drop into the left eye 2 (two) times daily as needed for dry eyes.   No current facility-administered medications for this visit. (Ophthalmic Drugs)   Current Outpatient Medications (Other)  Medication Sig   alprazolam (XANAX) 2 MG tablet Take 2 mg by mouth See admin instructions. Take 1 1/2 tablets (3 mg) by mouth daily at bedtime, may also take 1/2 tablet (1 mg) during the day as needed for anxiety   amoxicillin-clavulanate (AUGMENTIN) 875-125 MG tablet Take 1 tablet by mouth every 12 (twelve) hours.   aspirin EC 81 MG tablet Take 81 mg by mouth See  admin instructions. Take one tablet (81 mg) by mouth every other night.  Swallow whole.   blood glucose meter kit and supplies KIT Dispense based on patient and insurance preference. Use up to four times daily as directed.   clobetasol ointment (TEMOVATE) 3.15 % Apply 1 application topically 2 (two) times daily as needed (itching/psoriasis).   Continuous Blood Gluc Receiver (FREESTYLE LIBRE 2 READER) DEVI USE AS DIRECTED   Continuous Blood Gluc Sensor (FREESTYLE LIBRE 2 SENSOR) MISC 1 Device by Does not apply route every 14 (fourteen) days.   HYDROcodone-acetaminophen (NORCO) 5-325 MG per tablet Take 1 tablet by mouth 2 (two) times daily as needed (pain).   hydrocortisone (ANUSOL-HC) 2.5 % rectal cream Place 1 application. rectally 2 (two) times daily.   hydrocortisone 2.5 % cream Apply 1 application topically 2 (two) times daily. Apply to face   insulin glargine (LANTUS) 100 UNIT/ML Solostar Pen Inject 12 Units into the skin every morning.   metFORMIN (GLUCOPHAGE) 500 MG tablet Take 1 tablet (500 mg total) by mouth 2 (two) times daily with a meal.   nystatin (MYCOSTATIN) 100000 UNIT/ML suspension Take 5 mLs by mouth 4 (four) times daily. Swish and spit   polyethylene glycol (MIRALAX / GLYCOLAX) 17 g packet Take 17 g by mouth 2 (two) times daily. Mix in 8 oz liquid and drink   triamcinolone ointment (KENALOG) 0.1 % Apply 1 application topically 2 (  two) times daily as needed (itching/psoriasis).   Vitamin D, Ergocalciferol, (DRISDOL) 1.25 MG (50000 UNIT) CAPS capsule Take 50,000 Units by mouth every Wednesday.   No current facility-administered medications for this visit. (Other)   REVIEW OF SYSTEMS: ROS   Positive for: Endocrine, Eyes Negative for: Constitutional, Gastrointestinal, Neurological, Skin, Genitourinary, Musculoskeletal, HENT, Cardiovascular, Respiratory, Psychiatric, Allergic/Imm, Heme/Lymph Last edited by Annie Paras, COT on 04/15/2022  1:07 PM.       ALLERGIES Allergies  Allergen Reactions   Codeine Nausea Only   Diphenhydramine Hcl Other (See Comments)    Makes heart race   Nasal Decongestant [Pseudoephedrine] Other (See Comments)    Nervousness, insomnia   PAST MEDICAL HISTORY Past Medical History:  Diagnosis Date   Anxiety    Bipolar disorder ([place])    Chest pain    Myocardial Perfusion Study 02/08/02 - Abnormal study suggesting ischemia in anterior to anteroseptal wall to mid to upper mid ventricular region w/associated hypocontractibility. Clinical correlation is recommended. EF=46% (Dr. 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 Small amount of pericardial fluid in the pericardial recess adjacent to the ascending aorta   Rectocele, female    SUI (stress urinary incontinence, female)    Tachycardia    Past Surgical History:  Procedure Laterality Date   ABDOMINAL HYSTERECTOMY  1982   TAH   ANKLE SURGERY Right 1998   BACK SURGERY     CARDIAC CATHETERIZATION  03/15/02   No evidence of CAD   CHOLECYSTECTOMY  1998   KNEE SURGERY Right 1985   LAMINECTOMY     OOPHORECTOMY Left 1998   LSO - Benign tumor   POSTERIOR REPAIR  1985   FAMILY HISTORY Family History  Problem Relation Age of Onset   Diabetes Paternal Grandmother 70   Cancer Paternal Grandmother 14   Heart disease Paternal Grandfather    Heart attack Paternal Grandfather 77   Cancer Maternal Grandmother 61   Heart attack Maternal Grandfather 58   Heart defect Child    Diabetes Father    Heart disease Father    Heart attack Father    Lymphoma Maternal Uncle    Rheumatic fever Maternal Uncle    Heart attack Cousin 11   SOCIAL HISTORY Social History    Tobacco Use   Smoking status: Former    Packs/day: 1.00    Years: 23.00    Pack years: 23.00    Types: Cigarettes   Smokeless tobacco: Never  Vaping Use   Vaping Use: Every day   Substances: Nicotine  Substance Use Topics   Alcohol use: Yes    Alcohol/week: 0.0 standard drinks    Comment: occ   Drug use: No       OPHTHALMIC EXAM:   Base Eye Exam     Visual Acuity (Snellen - Linear)       Right Left   Dist cc 20/30 20/25   Dist ph cc NI     Correction: Glasses         Tonometry (Tonopen, 1:12 PM)       Right Left   Pressure 20 19  Pupils       Pupils Dark Light Shape React APD   Right PERRL 2 1 Round Sluggish None   Left PERRL 2 1 Round Sluggish None         Visual Fields       Left Right    Full Full         Extraocular Movement       Right Left    Full, Ortho Full, Ortho         Neuro/Psych     Oriented x3: Yes   Mood/Affect: Normal         Dilation     Both eyes: 2.5% Phenylephrine, 1.0% Mydriacyl @ 1:08 PM           Slit Lamp and Fundus Exam     Slit Lamp Exam       Right Left   Lids/Lashes Dermatochalasis - upper lid Dermatochalasis - upper lid, mild Meibomian gland dysfunction   Conjunctiva/Sclera White and quiet White and quiet   Cornea Arcus, trace Debris in tear film, trace Punctate epithelial erosions Arcus, trace Debris in tear film, trace Punctate epithelial erosions   Anterior Chamber Deep and quiet Deep and quiet   Iris Round and dilated Round and dilated   Lens 2+ Nuclear sclerosis, 2+ Cortical cataract 2+ Nuclear sclerosis, 2+ Cortical cataract   Anterior Vitreous Vitreous syneresis, Posterior vitreous detachment, vitreous condensations Vitreous syneresis, +PVD         Fundus Exam       Right Left   Disc Pink and Sharp, mild, temporal PPA, +PPP Pink and Sharp, +PPP, +cupping   C/D Ratio 0.5 0.7   Macula Flat, Good foveal reflex, mild Retinal pigment epithelial mottling, No heme or edema  Flat, Good foveal reflex, mild Retinal pigment epithelial mottling, No heme or edema   Vessels attenuated, mild AV crossing changes attenuated, Tortuous   Periphery Attached, pigmented laser scars at 0900; no RT/RD on exam Attached, no heme, no RT/RD on 360 exam           Refraction     Wearing Rx       Sphere Cylinder Axis Add   Right +5.00 -0.75 038 +1.50   Left +3.00 -1.00 002 +1.50            IMAGING AND PROCEDURES  Imaging and Procedures for $RemoveBefore'@TODAY'[id number]$ @  OCT, Retina - OU - Both Eyes       Right Eye Quality was good. Central Foveal Thickness: 244. Progression has been stable. Findings include normal foveal contour, no IRF, no SRF.   Left Eye Quality was good. Central Foveal Thickness: 248. Progression has been stable. Findings include normal foveal contour, no IRF, no SRF (stable release of VMA to full PVD).   Notes *Images captured and stored on drive  Diagnosis / Impression:  NFP, no IRF/SRF OU OS: stable release of VMA to full PVD  Clinical management:  See below  Abbreviations: NFP - Normal foveal profile. CME - cystoid macular edema. PED - pigment epithelial detachment. IRF - intraretinal fluid. SRF - subretinal fluid. EZ - ellipsoid zone. ERM - epiretinal membrane. ORA - outer retinal atrophy. ORT - outer retinal tubulation. SRHM - subretinal hyper-reflective material            ASSESSMENT/PLAN:    ICD-10-CM   1. Posterior vitreous detachment of both eyes  H43.813 OCT, Retina - OU - Both Eyes    2. Diabetes mellitus type 2 without retinopathy ([place])  E11.9     3. History of repair of retinal tear by laser photocoagulation  Z98.890     4. Combined forms of age-related cataract of both eyes  H25.813      1. PVD / vitreous syneresis OU  - OD onset Apr 2021 -- stable  - OS acute onset 4.19.23 -- +flashes and floaters -- mildly persistent  - Discussed findings and prognosis  - No RT or RD on 360 scleral depressed exam OS  - Reviewed s/s of  RT/RD  - Strict return precautions for any such RT/RD signs/symptoms  - f/u 6 months, DFE, OCT  2. Diabetes mellitus, type 2 without retinopathy  - latest A1c is 5.4 on 04.20.23  - relatively new diagnosis -- diagnosed May 2022 -- hospitalized w/ BG >600 and A1c 11.3 - The incidence, risk factors for progression, natural history and treatment options for diabetic retinopathy  were discussed with patient.   - The need for close monitoring of blood glucose, blood pressure, and serum lipids, avoiding cigarette or any type of tobacco, and the need for long term follow up was also discussed with patient. - f/u in 6 mos, sooner prn  3. History of retinal tear OD  - s/p laser ([place])  4. Mixed cataracts OU  - The symptoms of cataract, surgical options, and treatments and risks were discussed with patient.  - discussed diagnosis and progression  - not yet visually significant  - monitor  Ophthalmic Meds Ordered this visit:  No orders of the defined types were placed in this encounter.    Return in about 6 months (around 10/15/2022) for f/u PVD OU, DFE, OCT.  There are no Patient Instructions on file for this visit.   Explained the diagnoses, plan, and follow up with the patient and they expressed understanding.  Patient expressed understanding of the importance of proper follow up care.  This document serves as a record of services personally performed by Gardiner Sleeper, MD, PhD. It was created on their behalf by Roselee Nova, COMT. The creation of this record is the provider's dictation and/or activities during the visit.  Electronically signed by: Roselee Nova, COMT 04/15/22 2:09 PM  This document serves as a record of services personally performed by Gardiner Sleeper, MD, PhD. It was created on their behalf by San Jetty. Owens Shark, OA an ophthalmic technician. The creation of this record is the provider's dictation and/or activities during the visit.    Electronically signed by:  San Jetty. Owens Shark, [place] 06.05.2023 2:09 PM  Gardiner Sleeper, M.D., Ph.D. Diseases & Surgery of the Retina and Vitreous [facility]  I have reviewed the above documentation for accuracy and completeness, and I agree with the above. Gardiner Sleeper, M.D., Ph.D. 04/15/22 2:10 PM  Abbreviations: M myopia (nearsighted); A astigmatism; H hyperopia (farsighted); P presbyopia; Mrx spectacle prescription;  CTL contact lenses; OD right eye; OS left eye; OU both eyes  XT exotropia; ET esotropia; PEK punctate epithelial keratitis; PEE punctate epithelial erosions; DES dry eye syndrome; MGD meibomian gland dysfunction; ATs artificial tears; PFAT's preservative free artificial tears; [place] nuclear sclerotic cataract; PSC posterior subcapsular cataract; ERM epi-retinal membrane; PVD posterior vitreous detachment; RD retinal detachment; DM diabetes mellitus; DR diabetic retinopathy; NPDR non-proliferative diabetic retinopathy; PDR proliferative diabetic retinopathy; CSME clinically significant macular edema; DME diabetic macular edema; dbh dot blot hemorrhages; CWS cotton wool spot; POAG primary open angle glaucoma; C/D cup-to-disc ratio; HVF humphrey visual field; GVF goldmann visual  field; OCT optical coherence tomography; IOP intraocular pressure; BRVO Branch retinal vein occlusion; CRVO central retinal vein occlusion; CRAO central retinal artery occlusion; BRAO branch retinal artery occlusion; RT retinal tear; SB scleral buckle; PPV pars plana vitrectomy; VH Vitreous hemorrhage; PRP panretinal laser photocoagulation; IVK intravitreal kenalog; VMT vitreomacular traction; MH Macular hole;  NVD neovascularization of the disc; NVE neovascularization elsewhere; AREDS age related eye disease study; ARMD age related macular degeneration; POAG primary open angle glaucoma; EBMD epithelial/anterior basement membrane dystrophy; ACIOL anterior chamber intraocular lens; IOL intraocular lens; PCIOL posterior chamber  intraocular lens; Phaco/IOL phacoemulsification with intraocular lens placement; [place] photorefractive keratectomy; LASIK laser assisted in situ keratomileusis; HTN hypertension; DM diabetes mellitus; COPD chronic obstructive pulmonary disease

## 2022-04-15 ENCOUNTER — Ambulatory Visit (INDEPENDENT_AMBULATORY_CARE_PROVIDER_SITE_OTHER): Payer: HMO | Admitting: Ophthalmology

## 2022-04-15 ENCOUNTER — Encounter (INDEPENDENT_AMBULATORY_CARE_PROVIDER_SITE_OTHER): Payer: Self-pay | Admitting: Ophthalmology

## 2022-04-15 DIAGNOSIS — Z9889 Other specified postprocedural states: Secondary | ICD-10-CM

## 2022-04-15 DIAGNOSIS — H25813 Combined forms of age-related cataract, bilateral: Secondary | ICD-10-CM

## 2022-04-15 DIAGNOSIS — E119 Type 2 diabetes mellitus without complications: Secondary | ICD-10-CM

## 2022-04-15 DIAGNOSIS — H43813 Vitreous degeneration, bilateral: Secondary | ICD-10-CM

## 2022-04-15 DIAGNOSIS — H43811 Vitreous degeneration, right eye: Secondary | ICD-10-CM

## 2022-05-27 ENCOUNTER — Ambulatory Visit (INDEPENDENT_AMBULATORY_CARE_PROVIDER_SITE_OTHER): Payer: PPO | Admitting: Endocrinology

## 2022-05-27 ENCOUNTER — Encounter: Payer: Self-pay | Admitting: Endocrinology

## 2022-05-27 VITALS — BP 116/78 | HR 109 | Ht 68.0 in | Wt 148.0 lb

## 2022-05-27 DIAGNOSIS — R634 Abnormal weight loss: Secondary | ICD-10-CM | POA: Diagnosis not present

## 2022-05-27 DIAGNOSIS — K8689 Other specified diseases of pancreas: Secondary | ICD-10-CM

## 2022-05-27 DIAGNOSIS — E089 Diabetes mellitus due to underlying condition without complications: Secondary | ICD-10-CM | POA: Diagnosis not present

## 2022-05-27 LAB — POCT GLYCOSYLATED HEMOGLOBIN (HGB A1C): Hemoglobin A1C: 5.3 % (ref 4.0–5.6)

## 2022-05-27 LAB — POCT GLUCOSE (DEVICE FOR HOME USE): Glucose Fasting, POC: 86 mg/dL (ref 70–99)

## 2022-05-27 NOTE — Progress Notes (Signed)
Patient ID: [name], female   DOB: 1955-12-15, 66 y.o.   MRN: [id number]           Reason for Appointment: Type II Diabetes follow-up   History of Present Illness   Diagnosis date: 2022  Previous history:  Non-insulin hypoglycemic drugs previously used: Metformin Insulin was started in 5/22 during an admission for hyperosmolar hyperglycemic state and glucose of 1109 She was discharged home on 35 units of Lantus but no mealtime insulin and her hyperglycemia was felt to be likely from acute pancreatitis of unclear etiology  A1c range since 05/2021 has been 5.1-6.1; at diagnosis it was 11.3  Recent history:     Non-insulin hypoglycemic drugs: Metformin 500 mg twice daily     Insulin regimen: Lantus 12 units daily           Side effects from medications: None  Current self management, blood sugar patterns and problems identified:  A1c is 5.3 Although she had significant hyperglycemia in 5/22 at the time of her pancreatitis her blood sugars appear to improve very rapidly and as an outpatient she is taking only small doses of basal insulin Her A1c has been in the normal range since 12/22 However with her continuous glucose monitor her blood sugars are fairly consistently averaging below 100 at any given time except an average of 104 between 10 AM and 12 noon She also has no significant variability in her blood sugars Currently with her sensor and she is measuring her blood sugars very frequently throughout the day and occasionally at night There is no postprandial hyperglycemia seen and no hypoglycemia also She applies the sensor to her chest wall as she does not like it to be on her arm in case it gets locked up Her blood sugar today in the office after the snack is 86 although with her sensor it is 121  Exercise: She tries to do a lot of walking or aerobic dancing for up to 10-20 minutes a day Diet management: She is cutting back on her calories and reportedly consuming  only 1400 cal a day Also has very little carbohydrate at every meal She is also adding protein to her meals, breakfast is usually an Atkins shake     Hypoglycemia:  none             Average blood sugar from sensor for the last 2 weeks = 96 with GMI 5.6   Dietician visit: Most recent: 3/23     Weight control: 192  Wt Readings from Last 3 Encounters:  05/27/22 148 lb (67.1 kg)  02/28/22 160 lb 3.2 oz (72.7 kg)  12/19/21 161 lb (73 kg)            Diabetes labs:  Lab Results  Component Value Date   HGBA1C 5.3 05/27/2022   HGBA1C 5.4 02/28/2022   HGBA1C 5.3 12/19/2021   Lab Results  Component Value Date   LDLCALC 70 03/12/2021   CREATININE 0.86 03/13/2021    No results found for: "FRUCTOSAMINE"   Allergies as of 05/27/2022       Reactions   Codeine Nausea Only   Diphenhydramine Hcl Other (See Comments)   Makes heart race   Nasal Decongestant [pseudoephedrine] Other (See Comments)   Nervousness, insomnia        Medication List        Accurate as of May 27, 2022  4:50 PM. If you have any questions, ask your nurse or doctor.  alprazolam 2 MG tablet Commonly known as: XANAX Take 2 mg by mouth See admin instructions. Take 1 1/2 tablets (3 mg) by mouth daily at bedtime, may also take 1/2 tablet (1 mg) during the day as needed for anxiety   amoxicillin-clavulanate 875-125 MG tablet Commonly known as: AUGMENTIN Take 1 tablet by mouth every 12 (twelve) hours.   aspirin EC 81 MG tablet Take 81 mg by mouth See admin instructions. Take one tablet (81 mg) by mouth every other night.  Swallow whole.   blood glucose meter kit and supplies Kit Dispense based on patient and insurance preference. Use up to four times daily as directed.   clobetasol ointment 0.05 % Commonly known as: TEMOVATE Apply 1 application topically 2 (two) times daily as needed (itching/psoriasis).   FreeStyle Libre 2 Reader Kerrin Mo USE AS DIRECTED   FreeStyle Libre 2 Sensor Misc 1  Device by Does not apply route every 14 (fourteen) days.   HYDROcodone-acetaminophen 5-325 MG tablet Commonly known as: NORCO/VICODIN Take 1 tablet by mouth 2 (two) times daily as needed (pain).   hydrocortisone 2.5 % cream Apply 1 application topically 2 (two) times daily. Apply to face   hydrocortisone 2.5 % rectal cream Commonly known as: ANUSOL-HC Place 1 application. rectally 2 (two) times daily.   insulin glargine 100 UNIT/ML Solostar Pen Commonly known as: LANTUS Inject 12 Units into the skin every morning.   metFORMIN 500 MG tablet Commonly known as: GLUCOPHAGE Take 1 tablet (500 mg total) by mouth 2 (two) times daily with a meal.   nystatin 100000 UNIT/ML suspension Commonly known as: MYCOSTATIN Take 5 mLs by mouth 4 (four) times daily. Swish and spit   polyethylene glycol 17 g packet Commonly known as: MIRALAX / GLYCOLAX Take 17 g by mouth 2 (two) times daily. Mix in 8 oz liquid and drink   polyvinyl alcohol 1.4 % ophthalmic solution Commonly known as: LIQUIFILM TEARS Place 1 drop into the left eye 2 (two) times daily as needed for dry eyes.   triamcinolone ointment 0.1 % Commonly known as: KENALOG Apply 1 application topically 2 (two) times daily as needed (itching/psoriasis).   Vitamin D (Ergocalciferol) 1.25 MG (50000 UNIT) Caps capsule Commonly known as: DRISDOL Take 50,000 Units by mouth every Wednesday.        Allergies:  Allergies  Allergen Reactions   Codeine Nausea Only   Diphenhydramine Hcl Other (See Comments)    Makes heart race   Nasal Decongestant [Pseudoephedrine] Other (See Comments)    Nervousness, insomnia    Past Medical History:  Diagnosis Date   Anxiety    Bipolar disorder ([place])    Chest pain    Myocardial Perfusion Study 02/08/02 - Abnormal study suggesting ischemia in anterior to anteroseptal wall to mid to upper mid ventricular region w/associated hypocontractibility. Clinical correlation is recommended. EF=46% (Dr. 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 Small amount of pericardial fluid in the pericardial recess adjacent to the ascending aorta   Rectocele, female    SUI (stress urinary incontinence, female)    Tachycardia     Past Surgical History:  Procedure Laterality Date  ABDOMINAL HYSTERECTOMY  1982   TAH   ANKLE SURGERY Right 1998   BACK SURGERY     CARDIAC CATHETERIZATION  03/15/02   No evidence of CAD   CHOLECYSTECTOMY  1998   KNEE SURGERY Right 1985   LAMINECTOMY     OOPHORECTOMY Left 1998   LSO - Benign tumor   POSTERIOR REPAIR  1985    Family History  Problem Relation Age of Onset   Diabetes Paternal Grandmother 59   Cancer Paternal Grandmother 55   Heart disease Paternal Grandfather    Heart attack Paternal Grandfather 70   Cancer Maternal Grandmother 61   Heart attack Maternal Grandfather 58   Heart defect Child    Diabetes Father    Heart disease Father    Heart attack Father    Lymphoma Maternal Uncle    Rheumatic fever Maternal Uncle    Heart attack Cousin 53    Social History:  reports that she has quit smoking. Her smoking use included cigarettes. She has a 23.00 pack-year smoking history. She has never used smokeless tobacco. She reports current alcohol use. She reports that she does not use drugs.  Review of Systems:   Last foot exam date: 08/2021   Hypertension: Not present  BP Readings from Last 3 Encounters:  05/27/22 116/78  03/30/22 133/72  02/28/22 122/70    Lipid management: She has had normal cholesterol   She has been prescribed 10 mg Lipitor by her PCP    Lab Results  Component Value Date   CHOL 147 03/12/2021   Lab Results  Component  Value Date   HDL 34 (L) 03/12/2021   Lab Results  Component Value Date   LDLCALC 70 03/12/2021   Lab Results  Component Value Date   TRIG 216 (H) 03/12/2021   Lab Results  Component Value Date   CHOLHDL 4.3 03/12/2021   No results found for: "LDLDIRECT"  Her PCP diagnosed her with thyroiditis and an ultrasound only showed heterogenous thyroid tissue and only insignificant subcentimeter nodule Thyroid functions   Lab Results  Component Value Date   TSH 0.40 03/05/2022   She has been treated for psoriasis and has some lesions on her feet also    Examination:   BP 116/78 (BP Location: Left Arm, Patient Position: Sitting, Cuff Size: Normal)   Pulse (!) 109   Ht _0  (1.727 m)   Wt 148 lb (67.1 kg)   SpO2 100%   BMI 22.50 kg/m   Body mass index is 22.5 kg/m.    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  Current regimen: Metformin 500 mg twice daily, Lantus 12 units daily  See history of present illness for detailed discussion of current diabetes management, blood sugar patterns and problems identified  A1c is consistently in the normal range  Blood glucose is controlled in the normal range only minimal fluctuation even after meals as judged by her sensor This indicates fairly normal endogenous insulin secretion  Discussed with the patient that her severe hyperglycemia was in the setting of acute pancreatitis Since she has not had any further episodes of pancreatitis and does not appear to have chronic pancreatitis likely has normal insulin secretion now Also with her significant weight loss of at least 40 pounds since before the pancreatitis episodes she is likely not insulin resistant and effectively is in remission from diabetes  Recommendations:  She needs to wear her sensor on her upper arm either inside or outside which  is recommended by the manufacturer and her reading appears to be relatively higher compared to fingerstick today She  does not need to check her sugar multiple times a day Discussed that her blood sugar readings overnight can be up to 110 and still be acceptable Also after meals her blood sugars about 160 are also acceptable as long as majority of the readings are below 140 To help assess any need for insulin needs she likely needs to get to taper off her insulin; she is reluctant to stop it abruptly now even though she is taking only a minimal dose of 12 units She can reduce it by 2 units weekly and after reaching 6 units can stop She will follow-up with her nutritionist but likely can have up to 30 g carbohydrate at meals as long as she has enough protein with each meal She can also try to maintain her weight level and does not need to lose any further weight with her current BMI of about 22  Encouraged her to be as active as possible with both aerobic exercise and weights Follow-up in 2 months  Over 50% of the visit time was spent in counseling, the total visit time for detailed review of her history from available records, evaluation and management = 40 minutes  Patient Instructions  10 units insulin and reduce by 2 units weekly and from 6 units reduce to zero  Check blood sugars on waking up days a week  Also check blood sugars about 2 hours after meals and do this after different meals by rotation  Recommended blood sugar levels on waking up are 80-110 and about 2 hours after meal is 120-160  Please bring your blood sugar monitor to each visit, thank you  Use inner arm for sensor   Elayne Snare 05/27/2022, 4:50 PM

## 2022-05-27 NOTE — Patient Instructions (Addendum)
10 units insulin and reduce by 2 units weekly and from 6 units reduce to zero  Check blood sugars on waking up days a week  Also check blood sugars about 2 hours after meals and do this after different meals by rotation  Recommended blood sugar levels on waking up are 80-110 and about 2 hours after meal is 120-160  Please bring your blood sugar monitor to each visit, thank you  Use inner arm for sensor

## 2022-05-30 ENCOUNTER — Encounter: Payer: Self-pay | Admitting: Dietician

## 2022-05-30 ENCOUNTER — Encounter: Payer: HMO | Attending: Family Medicine | Admitting: Dietician

## 2022-05-30 DIAGNOSIS — Z713 Dietary counseling and surveillance: Secondary | ICD-10-CM | POA: Diagnosis not present

## 2022-05-30 DIAGNOSIS — E119 Type 2 diabetes mellitus without complications: Secondary | ICD-10-CM | POA: Insufficient documentation

## 2022-05-30 NOTE — Progress Notes (Signed)
Diabetes Self-Management Education  Visit Type: Follow-up  Appt. Start Time: 1530 Appt. End Time: 9563  05/31/2022  [name], identified by name and date of birth, is a 66 y.o. female with a diagnosis of Diabetes:  .   ASSESSMENT Patient is here today alone.  She was last seen by another RD in the office on 03/12/2022.  She reports diabetes secondary to pancreatitis.  She is weaning off insulin.  She is nervous to wean as she is worried about increased blood glucose.  She reports less frequent hypoglycemia.  She complains of increased bruising. She has little variety, follows a low carbohydrate diet with adequate protein (about 70 grams per day). She was very concerned that if she increased her carbohydrate amount, this would cause her blood glucose to increase.  She was hesitant to add carbohydrates to her meal plan. Complains of increased bruising.  Discussed that this could be due to nutritional deficiency (vitamin C, iron absorption -although hemoglobin is normal). Dancing daily for 30 minutes. She has continued to lose weight and recommended that she maintain her weight at 140 lbs.  She states that she would like to lose to 130 lbs.  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 Labs noted to include:  A1C  5.3% 05/27/2022 decreased from 5.4% 02/28/2022.  High of 11.3% with pancreatitis 03/11/2021, Vitamin D 73.6, Vitamin B-12 242, HGB 14.1 05/29/2022 CGM:  Free Style Libre 2  Weight hx: 68" 148 lbs 05/30/2022 161 lbs 01/24/2022 Patient goal 140 lbs  Patient lives with her husband.   Avoiding rice, bread, many carbohydrates and follows a low carb diet (due to fear of blood glucose rising). Weight 148 lb (67.1 kg). Body mass index is 22.5 kg/m.   Diabetes Self-Management Education - 05/31/22 1302        Visit Information   Visit Type Follow-up      Psychosocial Assessment   Patient Belief/Attitude about Diabetes Motivated to manage diabetes    What is the hardest part about your diabetes right now, causing you the most concern, or is the most worrisome to you about your diabetes?   Making healty food and beverage choices    Self-care barriers None    Self-management support Doctor's office;CDE visits    Other persons present Patient    Patient Concerns Nutrition/Meal planning;Glycemic Control    Special Needs None    Preferred Learning Style No preference indicated    Learning Readiness Ready    How often do you need to have someone help you when you read instructions, pamphlets, or other written materials from your doctor or pharmacy? 1 - Never      Pre-Education Assessment   Patient understands the diabetes disease and treatment process. Comprehends key points    Patient understands incorporating nutritional management into lifestyle. Needs Review    Patient undertands incorporating physical activity into lifestyle. Demonstrates understanding / competency    Patient understands using medications safely. Comprehends key points    Patient understands monitoring blood glucose, interpreting and using results Demonstrates understanding / competency    Patient understands prevention, detection, and treatment of acute complications. Demonstrates understanding / competency    Patient understands prevention, detection, and treatment of chronic complications. Demonstrates understanding / competency    Patient understands how to develop strategies to address psychosocial issues. Comprehends key  points    Patient understands how to develop strategies to promote health/change behavior. Needs Review      Complications   Last HgB A1C per patient/outside source 5.3 %    How often do you check your blood sugar? > 4 times/day    Fasting Blood glucose range (mg/dL) 70-129    Postprandial Blood  glucose range (mg/dL) 130-179;70-129    Number of hypoglycemic episodes per month 2      Dietary Intake   Breakfast Atkins protein shake with 3 T egg whites    Lunch omelet, bite of pancake OR TWO good yogurt plus 3 T egg whites    Snack (afternoon) NABS (when out) or berries    Dinner 16 medium shrimp, ranch, vegetables OR cottage cheese 3/4 cup with berries    Snack (evening) 1/4 PB (from powder) and apple    Beverage(s) water (78 oz daily), coffee with splenda and fairlife milk      Activity / Exercise   Activity / Exercise Type Moderate (swimming / aerobic walking)    How many days per week do you exercise? 7    How many minutes per day do you exercise? 30    Total minutes per week of exercise 210      Patient Education   Previous Diabetes Education Yes (please comment)   last 03/2022   Disease Pathophysiology Other (comment)   reviewed insulin resistance/insulin insufficiency   Healthy Eating Meal options for control of blood glucose level and chronic complications.    Being Active Role of exercise on diabetes management, blood pressure control and cardiac health.    Medications Reviewed patients medication for diabetes, action, purpose, timing of dose and side effects.    Lifestyle and Health Coping Other (comment);Lifestyle issues that need to be addressed for better diabetes care   lack of variety, low carb, restrictive eating     Individualized Goals (developed by patient)   Nutrition General guidelines for healthy choices and portions discussed    Physical Activity Exercise 5-7 days per week    Medications take my medication as prescribed    Monitoring  Test my blood glucose as discussed    Reducing Risk treat hypoglycemia with 15 grams of carbs if blood glucose less than '70mg'$ /dL;Other (comment)   add variety and carbs to diet     Patient Self-Evaluation of Goals - Patient rates self as meeting previously set goals (% of time)   Nutrition 50 - 75 % (half of the time)     Physical Activity >75% (most of the time)    Medications >75% (most of the time)    Monitoring >75% (most of the time)    Problem Solving and behavior change strategies  25 - 50% (sometimes)    Reducing Risk (treating acute and chronic complications) 50 - 75 % (half of the time)    Health Coping 25 - 50% (sometimes)      Post-Education Assessment   Patient understands the diabetes disease and treatment process. Comprehends key points    Patient understands incorporating nutritional management into lifestyle. Needs Review    Patient undertands incorporating physical activity into lifestyle. Demonstrates understanding / competency    Patient understands using medications safely. Demonstrates understanding / competency    Patient understands monitoring blood glucose, interpreting and using results Demonstrates understanding / competency    Patient understands prevention, detection, and treatment of acute complications. Demonstrates understanding / competency    Patient understands prevention, detection, and treatment of  chronic complications. Demonstrates understanding / competency    Patient understands how to develop strategies to address psychosocial issues. Comprehends key points    Patient understands how to develop strategies to promote health/change behavior. Comprehends key points      Outcomes   Expected Outcomes Demonstrated interest in learning. Expect positive outcomes    Future DMSE 3-4 months    Program Status Not Completed      Subsequent Visit   Since your last visit have you experienced any weight changes? Loss    Weight Loss (lbs) 13    Since your last visit, are you checking your blood glucose at least once a day? Yes             Individualized Plan for Diabetes Self-Management Training:   Learning Objective:  Patient will have a greater understanding of diabetes self-management. Patient education plan is to attend individual and/or group sessions per assessed  needs and concerns.   Plan:   Patient Instructions  Start Vitamin B-12 (sublingual or dissolvable) Begin a multivitamin  Great job on being active! Consider ways to add variety to your diet:  butternut squash and think of other options.    Expected Outcomes:  Demonstrated interest in learning. Expect positive outcomes  Education material provided:   If problems or questions, patient to contact team via:  Phone  Future DSME appointment: 3-4 months

## 2022-05-30 NOTE — Patient Instructions (Addendum)
Start Vitamin B-12 (sublingual or dissolvable) Begin a multivitamin  Great job on being active! Consider ways to add variety to your diet:  butternut squash and think of other options.

## 2022-06-13 ENCOUNTER — Other Ambulatory Visit: Payer: Self-pay

## 2022-06-13 DIAGNOSIS — E119 Type 2 diabetes mellitus without complications: Secondary | ICD-10-CM

## 2022-06-13 MED ORDER — FREESTYLE LIBRE 2 READER DEVI: 1 refills | Status: DC

## 2022-06-13 MED ORDER — FREESTYLE LIBRE 2 SENSOR MISC: 1.0000 | 3 refills | Status: DC

## 2022-06-17 ENCOUNTER — Telehealth: Payer: Self-pay | Admitting: Dietician

## 2022-06-17 NOTE — Telephone Encounter (Signed)
Returned patient call.  She was last seen by this RD 05/30/2022.  Concerns of nutrition deficiency and restrictive eating noted.  She is concerned that her blood glucose is too high and rises too much with meals although it is in the normal range.  Patient states that she has been losing weight from 144 lbs to 140.8 lbs in the past week.  This is very concerning to her.  She also notes that she has been tracking her intake and has only been getting a about 1000 calories a day.  She is very hungry at times in the evening and needs to eat in a hurry.  She has started a MVI and biotin supplement and states that she plans on starting a vitamin C.  Discussed that a diet that causes a nutritional deficiency is never the goal.  It is improtant to have a balanced diet with adequate carbohydrates for fuel and calories and insulin will be continued if needed to maintain a blood glucose in range.  Reviewed blood glucose goals and what a normal rise would be with a meal even for those without diabetes.   Discussed that foods that have a lot of fat take a longer time to digest and fasting blood glucose may be higher.   She had a fasting blood glucose of 106 the am after 1/4 of regular peanut butter and an apple.  Discussed that the blood glucose was not a concern.  Discussed for her to continue to decrease the insulin but she is nervous to do this.   Discussed that her estimated nutritional needs are about 1900 calories and 60 grams of protein per day.  Discussed more regular meal schedule, implications of appetite on self care but on eating despite a low appetite.  She reports a gnawing/burning stomach at times when she goes more than 5 hours without eating.  Discussed that this is a signal that she needs to eat.  Patient stated that she will try to eat 4 small meals per day.  Discussed that she should aim for 30 grams of carbohydrates with each meal.  Encouraged healthy eating without restriction.  Antonieta Iba, RD, LDN, CDCES

## 2022-06-27 ENCOUNTER — Telehealth: Payer: Self-pay

## 2022-06-27 NOTE — Telephone Encounter (Signed)
Patient left voicemail that she has not had any insulin today and her blood sugar is 69 on sensor she did fingerstick and blood sugar is 61. I called patient. She states that blood sugar is currently 91 and she is ok now.

## 2022-07-18 ENCOUNTER — Other Ambulatory Visit: Payer: Self-pay | Admitting: Family Medicine

## 2022-07-18 ENCOUNTER — Telehealth: Payer: Self-pay | Admitting: Dietician

## 2022-07-18 DIAGNOSIS — Z1231 Encounter for screening mammogram for malignant neoplasm of breast: Secondary | ICD-10-CM

## 2022-07-18 NOTE — Telephone Encounter (Signed)
Returned patient call. Patient is concerned that she continues to lose weight.   Weight decreased to 133.6 lbs today. From 140 lbs 1 month ago.  24 hour recall: Breakfast (10 am):  Ad[employer] protein shake, 3 T egg whites Lunch:  (2 pm) 1/2 apple, 1 Tablespoon peanut butter Snack:  1 tsp butter and 1 small slab cream cheese Dinner:  (6 pm)  4 oz chicken, sour cream, broccoli, 1/3 sweet potato sprayed with I Can't Believe It's not Butter Snack:  1 vanilla wafer and 1 tsp peanut butter  Low blood glucose at times and mostly at night.  She no longer takes the insulin.  She states that she counts her calories and keeps this to 1300 per day and never over.  Eating has remained restrictive.  She states that her husband and daughter are concered.  Discussed that she needs to increase her calories to 1800 per day and increase her carbohydrates to 45 grams per meal.  Patient is concerned about high blood glucose with increased carbohydrates.  She states that she will increase her carbohydrates to 30 grams per meal. Discussed fat and to choose unsaturated fat to prevent heartburn.  Discussed small portions of fat due to pancreatitis history.  She ate an avocado daily when she got out of the hospital with pancreatitis and states that she tolerated this well.  Recommendations: 1800 calories Increase carbohydrates to 45 grams per meal Balance meals  Add fruit into the shake  Add a sandwich or salad to lunch with protein and continue the yogurt and fruit with lunch or as a snack  Consider a snack before bed with a protein and 15 grams of carbohydrates.  Patient has a follow-up appointment in October and she is to call for further questions or concerns.  Antonieta Iba, RD, LDN, CDCES

## 2022-07-25 ENCOUNTER — Ambulatory Visit (HOSPITAL_COMMUNITY)
Admission: RE | Admit: 2022-07-25 | Discharge: 2022-07-25 | Disposition: A | Discharge status: Home / Self Care | Payer: HMO | Source: Ambulatory Visit | Attending: Vascular Surgery | Admitting: Vascular Surgery

## 2022-07-25 ENCOUNTER — Other Ambulatory Visit (HOSPITAL_COMMUNITY): Payer: Self-pay | Admitting: Family Medicine

## 2022-07-25 DIAGNOSIS — R0989 Other specified symptoms and signs involving the circulatory and respiratory systems: Secondary | ICD-10-CM | POA: Insufficient documentation

## 2022-08-05 ENCOUNTER — Encounter: Payer: Self-pay | Admitting: Endocrinology

## 2022-08-05 ENCOUNTER — Ambulatory Visit (INDEPENDENT_AMBULATORY_CARE_PROVIDER_SITE_OTHER): Payer: HMO | Admitting: Endocrinology

## 2022-08-05 VITALS — BP 98/66 | HR 67 | Ht 68.0 in | Wt 137.0 lb

## 2022-08-05 DIAGNOSIS — E785 Hyperlipidemia, unspecified: Secondary | ICD-10-CM | POA: Diagnosis not present

## 2022-08-05 DIAGNOSIS — K8689 Other specified diseases of pancreas: Secondary | ICD-10-CM

## 2022-08-05 DIAGNOSIS — E089 Diabetes mellitus due to underlying condition without complications: Secondary | ICD-10-CM

## 2022-08-05 NOTE — Progress Notes (Signed)
Patient ID: [name], female   DOB: [birth year], 66 y.o.   MRN: [id number]           Reason for Appointment: Type II Diabetes follow-up   History of Present Illness   Diagnosis date: 2022  Previous history:  Non-insulin hypoglycemic drugs previously used: Metformin Insulin was started in 5/22 during an admission for hyperosmolar hyperglycemic state and glucose of 1109 She was discharged home on 35 units of Lantus but no mealtime insulin and her hyperglycemia was felt to be likely from acute pancreatitis of unclear etiology  Subsequently her blood sugars appeared to improve very rapidly and as an outpatient she was taking only small doses of basal insulin  A1c range since 05/2021 has been 5.1-6.1; at diagnosis it was 11.3  Recent history:     Non-insulin hypoglycemic drugs: Metformin 500 mg twice daily     Insulin regimen: Lantus none    Side effects from medications: None  Current self management, blood sugar patterns and problems identified:  A1c is 5 compared to 5.3 She was told to stop her Lantus after her last visit and she did this after tapering off fairly rapidly Previously she was concerned about blood sugars going up without insulin She says that she has not had much appetite and has lost weight but not clear if this is continuing to go down  Her A1c has been in the normal range since 12/22 Overnight blood sugars are stable in the 90s There is no postprandial hyperglycemia again She can sometimes applies the sensor to her chest wall as she does not like it to be on her outer arm where it can get knocked off Also occasionally her sensor reading may be different than the fingersticks Lab glucose not well able to correlate She was last seen by the nutritionist in June  Exercise: Regular walking or aerobic dancing for up to 10-20 minutes a day  Diet management: She is cutting back on her calories and reportedly consuming only 1400 cal a day Also has gone to 20  g of carbohydrate at breakfast and lunch and about 30 at dinner meal She is also adding protein to her meals, breakfast is usually an Atkins shake     Hypoglycemia:  none             CGM for the freestyle libre version 2 for the last 2 weeks shows the following data  Blood sugars are very consistent throughout the day and night with range of about 66-128 AVERAGE blood sugar is lowest early morning at 92 and highest 105 after breakfast No hypoglycemia although has occasional readings of about 66 She thinks that a couple of times her fingerstick readings have been 10-40 mg higher than the sensor reading Minimal fluctuation seen after meals Average blood sugar from sensor for the last 2 weeks = 98 with GMI 5.7   Dietician visit: Most recent: 3/23     Weight control: Maximum weight previously 192  Wt Readings from Last 3 Encounters:  08/05/22 137 lb (62.1 kg)  05/31/22 148 lb (67.1 kg)  05/27/22 148 lb (67.1 kg)            Diabetes labs:  Lab Results  Component Value Date   HGBA1C 5.3 05/27/2022   HGBA1C 5.4 02/28/2022   HGBA1C 5.3 12/19/2021   Lab Results  Component Value Date   LDLCALC 70 03/12/2021   CREATININE 0.86 03/13/2021    No results found for: "FRUCTOSAMINE"   Allergies as  of 08/05/2022       Reactions   Codeine Nausea Only   Diphenhydramine Hcl Other (See Comments)   Makes heart race   Nasal Decongestant [pseudoephedrine] Other (See Comments)   Nervousness, insomnia        Medication List        Accurate as of August 05, 2022  3:01 PM. If you have any questions, ask your nurse or doctor.          alprazolam 2 MG tablet Commonly known as: XANAX Take 2 mg by mouth See admin instructions. Take 1 1/2 tablets (3 mg) by mouth daily at bedtime, may also take 1/2 tablet (1 mg) during the day as needed for anxiety   aspirin EC 81 MG tablet Take 81 mg by mouth See admin instructions. Take one tablet (81 mg) by mouth every other night.  Swallow  whole.   blood glucose meter kit and supplies Kit Dispense based on patient and insurance preference. Use up to four times daily as directed.   clobetasol ointment 0.05 % Commonly known as: TEMOVATE Apply 1 application topically 2 (two) times daily as needed (itching/psoriasis).   FreeStyle Libre 2 Reader Kerrin Mo USE AS DIRECTED   FreeStyle Libre 2 Sensor Misc 1 Device by Does not apply route every 14 (fourteen) days.   HYDROcodone-acetaminophen 5-325 MG tablet Commonly known as: NORCO/VICODIN Take 1 tablet by mouth 2 (two) times daily as needed (pain).   hydrocortisone 2.5 % cream Apply 1 application topically 2 (two) times daily. Apply to face   hydrocortisone 2.5 % rectal cream Commonly known as: ANUSOL-HC Place 1 application. rectally 2 (two) times daily.   insulin glargine 100 UNIT/ML Solostar Pen Commonly known as: LANTUS Inject 12 Units into the skin every morning.   metFORMIN 500 MG tablet Commonly known as: GLUCOPHAGE Take 1 tablet (500 mg total) by mouth 2 (two) times daily with a meal.   nystatin 100000 UNIT/ML suspension Commonly known as: MYCOSTATIN Take 5 mLs by mouth 4 (four) times daily. Swish and spit   polyethylene glycol 17 g packet Commonly known as: MIRALAX / GLYCOLAX Take 17 g by mouth 2 (two) times daily. Mix in 8 oz liquid and drink   polyvinyl alcohol 1.4 % ophthalmic solution Commonly known as: LIQUIFILM TEARS Place 1 drop into the left eye 2 (two) times daily as needed for dry eyes.   triamcinolone ointment 0.1 % Commonly known as: KENALOG Apply 1 application topically 2 (two) times daily as needed (itching/psoriasis).   Vitamin D (Ergocalciferol) 1.25 MG (50000 UNIT) Caps capsule Commonly known as: DRISDOL Take 50,000 Units by mouth every Wednesday.        Allergies:  Allergies  Allergen Reactions   Codeine Nausea Only   Diphenhydramine Hcl Other (See Comments)    Makes heart race   Nasal Decongestant [Pseudoephedrine] Other  (See Comments)    Nervousness, insomnia    Past Medical History:  Diagnosis Date   Anxiety    Bipolar disorder ([place])    Chest pain    Myocardial Perfusion Study 02/08/02 - Abnormal study suggesting ischemia in anterior to anteroseptal wall to mid to upper mid ventricular region w/associated hypocontractibility. Clinical correlation is recommended. EF=46% (Dr. 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 Small amount of pericardial fluid in the pericardial recess adjacent to the ascending aorta   Rectocele, female    SUI (stress urinary incontinence, female)    Tachycardia     Past Surgical History:  Procedure Laterality Date   ABDOMINAL HYSTERECTOMY  1982   TAH   ANKLE SURGERY Right 1998   BACK SURGERY     CARDIAC CATHETERIZATION  03/15/02   No evidence of CAD   CHOLECYSTECTOMY  1998   KNEE SURGERY Right 1985   LAMINECTOMY     OOPHORECTOMY Left 1998   LSO - Benign tumor   POSTERIOR REPAIR  1985    Family History  Problem Relation Age of Onset   Diabetes Paternal Grandmother 27   Cancer Paternal Grandmother 88   Heart disease Paternal Grandfather    Heart attack Paternal Grandfather 19   Cancer Maternal Grandmother 61   Heart attack Maternal Grandfather 58   Heart defect Child    Diabetes Father    Heart disease Father    Heart attack Father    Lymphoma Maternal Uncle    Rheumatic fever Maternal Uncle    Heart attack Cousin 53    Social History:  reports that she has quit smoking. Her smoking use included cigarettes. She has a 23.00 pack-year smoking history. She has never used smokeless tobacco. She reports current alcohol use. She reports that she does not use  drugs.  Review of Systems:   Last foot exam date: 08/2021   Hypertension: Not present  BP Readings from Last 3 Encounters:  08/05/22 98/66  05/27/22 116/78  03/30/22 133/72    Lipid management: She has had normal cholesterol   She has been prescribed 10 mg Lipitor by her PCP    Lab Results  Component Value Date   CHOL 147 03/12/2021   Lab Results  Component Value Date   HDL 34 (L) 03/12/2021   Lab Results  Component Value Date   LDLCALC 70 03/12/2021   Lab Results  Component Value Date   TRIG 216 (H) 03/12/2021   Lab Results  Component Value Date   CHOLHDL 4.3 03/12/2021   No results found for: "LDLDIRECT"  Her PCP diagnosed her with thyroiditis and an ultrasound only showed heterogenous thyroid tissue and only insignificant subcentimeter nodule Thyroid functions   Lab Results  Component Value Date   TSH 0.40 03/05/2022   She has been treated for psoriasis including in her feet    Examination:   BP 98/66   Pulse 67   Ht _0  (1.727 m)   Wt 137 lb (62.1 kg)   SpO2 99%   BMI 20.83 kg/m   Body mass index is 20.83 kg/m.    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  Current regimen: Metformin 500 mg twice daily only  See history of present illness for detailed discussion of current diabetes management, blood sugar patterns and problems identified  A1c is consistently in the normal range, now 5%  Blood glucose is controlled in the normal range without insulin Appears that she had hyperglycemia related to pancreatic injury but she has completely recovered now She has lost weight from restricted diet Also exercising Discussed with her that her blood sugars are in the normal range both with glucose and A1c values  Recommendations:  She needs to wear her sensor on her inner upper arm  where it can be more secure Hopefully she can switch to fingerstick periodically but she like to continuously monitor her readings Because  of her weight loss tendency and decreased appetite she can skip the metformin in the morning for now and take it only after dinner Follow-up in 5 months   There are no Patient Instructions on file for this visit.   Elayne Snare 08/05/2022, 3:01 PM

## 2022-08-05 NOTE — Patient Instructions (Signed)
Stop am Metformin

## 2022-08-07 ENCOUNTER — Encounter: Payer: Self-pay | Admitting: Endocrinology

## 2022-08-21 ENCOUNTER — Ambulatory Visit: Payer: PPO

## 2022-09-02 ENCOUNTER — Ambulatory Visit: Payer: PPO | Admitting: Dietician

## 2022-09-13 ENCOUNTER — Ambulatory Visit
Admission: RE | Admit: 2022-09-13 | Discharge: 2022-09-13 | Disposition: A | Discharge status: Home / Self Care | Payer: HMO | Source: Ambulatory Visit | Attending: Family Medicine | Admitting: Family Medicine

## 2022-09-13 DIAGNOSIS — Z1231 Encounter for screening mammogram for malignant neoplasm of breast: Secondary | ICD-10-CM

## 2022-10-07 NOTE — Progress Notes (Shared)
[facility] Note  10/15/2022     CHIEF COMPLAINT Patient presents for No chief complaint on file.   HISTORY OF PRESENT ILLNESS: [name] is a 66 y.o. female who presents to the clinic today for:    Patient states her fol and floaters have not gotten any better, but they have not gotten worse either  Referring physician: Bernerd Limbo, MD [address],  [place] [id number]  HISTORICAL INFORMATION:   Selected notes from the MEDICAL RECORD NUMBER Referred by Dr. Virgina Evener for PVD OD in pt with history of RT s/p laser retinopexy OD in [place]: Current Outpatient Medications (Ophthalmic Drugs)  Medication Sig   polyvinyl alcohol (LIQUIFILM TEARS) 1.4 % ophthalmic solution Place 1 drop into the left eye 2 (two) times daily as needed for dry eyes.   No current facility-administered medications for this visit. (Ophthalmic Drugs)   Current Outpatient Medications (Other)  Medication Sig   alprazolam (XANAX) 2 MG tablet Take 2 mg by mouth See admin instructions. Take 1 1/2 tablets (3 mg) by mouth daily at bedtime, may also take 1/2 tablet (1 mg) during the day as needed for anxiety   aspirin EC 81 MG tablet Take 81 mg by mouth See admin instructions. Take one tablet (81 mg) by mouth every other night.  Swallow whole.   blood glucose meter kit and supplies KIT Dispense based on patient and insurance preference. Use up to four times daily as directed.   clobetasol ointment (TEMOVATE) 0.63 % Apply 1 application topically 2 (two) times daily as needed (itching/psoriasis).   Continuous Blood Gluc Receiver (FREESTYLE LIBRE 2 READER) DEVI USE AS DIRECTED   Continuous Blood Gluc Sensor (FREESTYLE LIBRE 2 SENSOR) MISC 1 Device by Does not apply route every 14 (fourteen) days.   HYDROcodone-acetaminophen (NORCO) 5-325 MG per tablet Take 1 tablet by mouth 2 (two) times daily as needed (pain).   hydrocortisone  (ANUSOL-HC) 2.5 % rectal cream Place 1 application. rectally 2 (two) times daily.   hydrocortisone 2.5 % cream Apply 1 application topically 2 (two) times daily. Apply to face   insulin glargine (LANTUS) 100 UNIT/ML Solostar Pen Inject 12 Units into the skin every morning.   metFORMIN (GLUCOPHAGE) 500 MG tablet Take 1 tablet (500 mg total) by mouth 2 (two) times daily with a meal.   nystatin (MYCOSTATIN) 100000 UNIT/ML suspension Take 5 mLs by mouth 4 (four) times daily. Swish and spit   polyethylene glycol (MIRALAX / GLYCOLAX) 17 g packet Take 17 g by mouth 2 (two) times daily. Mix in 8 oz liquid and drink   triamcinolone ointment (KENALOG) 0.1 % Apply 1 application topically 2 (two) times daily as needed (itching/psoriasis).   Vitamin D, Ergocalciferol, (DRISDOL) 1.25 MG (50000 UNIT) CAPS capsule Take 50,000 Units by mouth every Wednesday.   No current facility-administered medications for this visit. (Other)   REVIEW OF SYSTEMS:    ALLERGIES Allergies  Allergen Reactions   Codeine Nausea Only   Diphenhydramine Hcl Other (See Comments)    Makes heart race   Nasal Decongestant [Pseudoephedrine] Other (See Comments)    Nervousness, insomnia   PAST MEDICAL HISTORY Past Medical History:  Diagnosis Date   Anxiety    Bipolar disorder ([place])    Chest pain    Myocardial Perfusion Study 02/08/02 - Abnormal study suggesting ischemia in anterior to anteroseptal wall to mid to upper mid ventricular region w/associated hypocontractibility. Clinical  correlation is recommended. EF=46% (Dr. 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 Small amount of pericardial fluid in the pericardial recess adjacent to  the ascending aorta   Rectocele, female    SUI (stress urinary incontinence, female)    Tachycardia    Past Surgical History:  Procedure Laterality Date   ABDOMINAL HYSTERECTOMY  1982   TAH   ANKLE SURGERY Right 1998   BACK SURGERY     CARDIAC CATHETERIZATION  03/15/02   No evidence of CAD   CHOLECYSTECTOMY  1998   KNEE SURGERY Right 1985   LAMINECTOMY     OOPHORECTOMY Left 1998   LSO - Benign tumor   POSTERIOR REPAIR  1985   FAMILY HISTORY Family History  Problem Relation Age of Onset   Diabetes Paternal Grandmother 47   Cancer Paternal Grandmother 31   Heart disease Paternal Grandfather    Heart attack Paternal Grandfather 66   Cancer Maternal Grandmother 61   Heart attack Maternal Grandfather 58   Heart defect Child    Diabetes Father    Heart disease Father    Heart attack Father    Lymphoma Maternal Uncle    Rheumatic fever Maternal Uncle    Heart attack Cousin 6   SOCIAL HISTORY Social History   Tobacco Use   Smoking status: Former    Packs/day: 1.00    Years: 23.00    Total pack years: 23.00    Types: Cigarettes   Smokeless tobacco: Never  Vaping Use   Vaping Use: Every day   Substances: Nicotine  Substance Use Topics   Alcohol use: Yes    Alcohol/week: 0.0 standard drinks of alcohol    Comment: occ   Drug use: No       OPHTHALMIC EXAM:   Not recorded     IMAGING AND PROCEDURES  Imaging and Procedures for _0 @          ASSESSMENT/PLAN:    ICD-10-CM   1. Posterior vitreous detachment of both eyes  H43.813     2. Diabetes mellitus type 2 without retinopathy ([place])  E11.9     3. History of repair of retinal tear by laser photocoagulation  Z98.890     4. Combined forms of age-related cataract of both eyes  H25.813     5. Posterior vitreous detachment of right eye  H43.811       1. PVD / vitreous syneresis OU  - OD onset Apr 2021 -- stable  - OS acute onset 4.19.23 -- +flashes and floaters -- mildly persistent  - Discussed  findings and prognosis  - No RT or RD on 360 scleral depressed exam OS  - Reviewed s/s of RT/RD  - Strict return precautions for any such RT/RD signs/symptoms  - f/u 6 months, DFE, OCT  2. Diabetes mellitus, type 2 without retinopathy  - latest A1c is 5.4 on 04.20.23  - relatively new diagnosis -- diagnosed May 2022 -- hospitalized w/ BG >600 and A1c 11.3 - The incidence, risk factors for progression, natural history and treatment options for diabetic retinopathy  were discussed with patient.   - The need  for close monitoring of blood glucose, blood pressure, and serum lipids, avoiding cigarette or any type of tobacco, and the need for long term follow up was also discussed with patient. - f/u in 6 mos, sooner prn  3. History of retinal tear OD  - s/p laser ([place])  4. Mixed cataracts OU  - The symptoms of cataract, surgical options, and treatments and risks were discussed with patient.  - discussed diagnosis and progression  - not yet visually significant  - monitor  Ophthalmic Meds Ordered this visit:  No orders of the defined types were placed in this encounter.    No follow-ups on file.  There are no Patient Instructions on file for this visit.   Explained the diagnoses, plan, and follow up with the patient and they expressed understanding.  Patient expressed understanding of the importance of proper follow up care.  This document serves as a record of services personally performed by Gardiner Sleeper, MD, PhD. It was created on their behalf by Renaldo Reel, [place] an ophthalmic technician. The creation of this record is the provider's dictation and/or activities during the visit.    Electronically signed by:  Renaldo Reel, COT  11.27.23 10:23 AM   Gardiner Sleeper, M.D., Ph.D. Diseases & Surgery of the Retina and Vitreous [facility]: M myopia (nearsighted); A astigmatism; H hyperopia (farsighted); P presbyopia;  Mrx spectacle prescription;  CTL contact lenses; OD right eye; OS left eye; OU both eyes  XT exotropia; ET esotropia; PEK punctate epithelial keratitis; PEE punctate epithelial erosions; DES dry eye syndrome; MGD meibomian gland dysfunction; ATs artificial tears; PFAT's preservative free artificial tears; [place] nuclear sclerotic cataract; PSC posterior subcapsular cataract; ERM epi-retinal membrane; PVD posterior vitreous detachment; RD retinal detachment; DM diabetes mellitus; DR diabetic retinopathy; NPDR non-proliferative diabetic retinopathy; PDR proliferative diabetic retinopathy; CSME clinically significant macular edema; DME diabetic macular edema; dbh dot blot hemorrhages; CWS cotton wool spot; POAG primary open angle glaucoma; C/D cup-to-disc ratio; HVF humphrey visual field; GVF goldmann visual field; OCT optical coherence tomography; IOP intraocular pressure; BRVO Branch retinal vein occlusion; CRVO central retinal vein occlusion; CRAO central retinal artery occlusion; BRAO branch retinal artery occlusion; RT retinal tear; SB scleral buckle; PPV pars plana vitrectomy; VH Vitreous hemorrhage; PRP panretinal laser photocoagulation; IVK intravitreal kenalog; VMT vitreomacular traction; MH Macular hole;  NVD neovascularization of the disc; NVE neovascularization elsewhere; AREDS age related eye disease study; ARMD age related macular degeneration; POAG primary open angle glaucoma; EBMD epithelial/anterior basement membrane dystrophy; ACIOL anterior chamber intraocular lens; IOL intraocular lens; PCIOL posterior chamber intraocular lens; Phaco/IOL phacoemulsification with intraocular lens placement; [place] photorefractive keratectomy; LASIK laser assisted in situ keratomileusis; HTN hypertension; DM diabetes mellitus; COPD chronic obstructive pulmonary disease

## 2022-10-15 ENCOUNTER — Encounter (INDEPENDENT_AMBULATORY_CARE_PROVIDER_SITE_OTHER): Payer: Self-pay | Admitting: Ophthalmology

## 2022-10-15 DIAGNOSIS — Z9889 Other specified postprocedural states: Secondary | ICD-10-CM

## 2022-10-15 DIAGNOSIS — H43811 Vitreous degeneration, right eye: Secondary | ICD-10-CM

## 2022-10-15 DIAGNOSIS — H43813 Vitreous degeneration, bilateral: Secondary | ICD-10-CM

## 2022-10-15 DIAGNOSIS — H25813 Combined forms of age-related cataract, bilateral: Secondary | ICD-10-CM

## 2022-10-15 DIAGNOSIS — E119 Type 2 diabetes mellitus without complications: Secondary | ICD-10-CM

## 2022-10-24 ENCOUNTER — Encounter: Payer: Self-pay | Admitting: Dietician

## 2022-10-24 ENCOUNTER — Encounter: Payer: HMO | Attending: Endocrinology | Admitting: Dietician

## 2022-10-24 VITALS — Wt 132.0 lb

## 2022-10-24 DIAGNOSIS — E119 Type 2 diabetes mellitus without complications: Secondary | ICD-10-CM | POA: Insufficient documentation

## 2022-10-24 NOTE — Patient Instructions (Addendum)
Work on a relationship with food.  We need carbohydrates for fuel for our body and brain.  Aim to eat 15 grams of carbohydrate with each meal.

## 2022-10-24 NOTE — Progress Notes (Signed)
Diabetes Self-Management Education  Visit Type: Follow-up  Appt. Start Time: 1505 Appt. End Time: 1610  10/24/2022  [name], identified by name and date of birth, is a 66 y.o. female with a diagnosis of Diabetes:  .   ASSESSMENT Patient is here today alone.  She was last seen by myself 7/20/203. She is having a lot of back pain related to her muscle.  She is going to PT now for this and is having an MRI next week. She states that she is walking some but has stopped her Aerobics class. She continues to use a CGM.  Fasting blood glucose 85-95 each morning.  Rise to 137 after some meals. A1C 5.3% 05/27/2022.  She continues to take Metformin and is no longer taking insulin. She states that if she no longer could use the CGM she would have to prick her finger all day and she would not like this.  Discussed that frequency of monitoring generally can decrease at her level of blood glucose control.  She is to follow up with her endocrinologist in February. She has increased fear about her intake and the effect on her blood glucose.  She is strictly avoiding carbohydrates and gets little variety in her diet.  She will eat berries or 1/4 cup of sweet potatoes occasionally and an apple regularly each evening. She states her goal weight is 127 lbs.  Weighs herself daily and is very concerned about very small weight increases. Concerns for disordered eating.  (Fear of eating carbs, continued desire to lose weight, very restrictive eating with very little variety).  Husband, daughter who is a doctor, and counselor are  concerned about eating behavior.  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 Labs noted to include:  A1C  5.3% 05/27/2022 decreased from 5.4% 02/28/2022.  High of 11.3% with pancreatitis 03/11/2021, Vitamin D  73.6, Vitamin B-12 242, HGB 14.1 05/29/2022 CGM:  Free Style Libre 2   Weight hx: 68" 132 lbs 10/24/2022 148 lbs 05/30/2022 161 lbs 01/24/2022 Patient goal 140 lbs (now 127)   Patient lives with her husband.   Avoiding rice, bread, many carbohydrates and follows a low carb diet (due to fear of blood glucose rising).  Weight 132 lb (59.9 kg). Body mass index is 20.07 kg/m.   Diabetes Self-Management Education - 10/24/22 1807       Visit Information   Visit Type Follow-up      Psychosocial Assessment   Patient Belief/Attitude about Diabetes Motivated to manage diabetes    What is the hardest part about your diabetes right now, causing you the most concern, or is the most worrisome to you about your diabetes?   Getting support / problem solving    Self-care barriers None    Self-management support Doctor's office;CDE visits    Other persons present Patient    Patient Concerns Nutrition/Meal planning    Special Needs None    Preferred Learning Style No preference indicated    Learning Readiness Ready      Pre-Education Assessment   Patient understands the diabetes disease and treatment process. Comprehends key points    Patient understands incorporating nutritional management into lifestyle. Needs Review    Patient undertands incorporating physical activity into lifestyle. Comprehends key points    Patient understands using medications safely. Comprehends key points    Patient  understands monitoring blood glucose, interpreting and using results Needs Review    Patient understands prevention, detection, and treatment of acute complications. Comprehends key points    Patient understands prevention, detection, and treatment of chronic complications. Compreheands key points    Patient understands how to develop strategies to address psychosocial issues. Needs Review    Patient understands how to develop strategies to promote health/change behavior. Needs Review      Complications    How often do you check your blood sugar? > 4 times/day    Fasting Blood glucose range (mg/dL) 70-129    Postprandial Blood glucose range (mg/dL) 130-179   less than 140     Dietary Intake   Breakfast Atkin's protein shake with 3-5 egg whites, Two good yogurt, strawberries or blackberries   11 am   Lunch two good yogurt with 2-3 T egg whites, 1/2 small apple   3 pm   Snack (afternoon) rare PB cracker (1)    Dinner 4 oz chicken breast, salad with ranch and occasional 1/4 cup sweet potatoes OR Golden Coral- salad vegetables, meat 1/2 chocolate covered cherry, bit of fruit cake    Snack (evening) 1/2-1 apple and 1/4 cup PB    Beverage(s) water, coffee with splenda and fairlife milk      Activity / Exercise   Activity / Exercise Type Light (walking / raking leaves)    How many days per week do you exercise? 5    How many minutes per day do you exercise? 30    Total minutes per week of exercise 150      Patient Education   Previous Diabetes Education Yes (please comment)    Disease Pathophysiology Definition of diabetes, type 1 and 2, and the diagnosis of diabetes    Healthy Eating Meal options for control of blood glucose level and chronic complications.    Being Active Role of exercise on diabetes management, blood pressure control and cardiac health.;Other (comment)   monitoring for any concerns of over exercising behavior   Medications Reviewed patients medication for diabetes, action, purpose, timing of dose and side effects.    Monitoring Taught/evaluated SMBG meter.      Individualized Goals (developed by patient)   Nutrition General guidelines for healthy choices and portions discussed    Physical Activity --   PT, walk as desired   Medications take my medication as prescribed    Monitoring  Other (comment)   monitor prn   Problem Solving Eating Pattern   improve nutrition balance   Reducing Risk Other (comment)   balance meals   Health Coping Ask for help with psychological,  social, or emotional issues      Patient Self-Evaluation of Goals - Patient rates self as meeting previously set goals (% of time)   Nutrition 50 - 75 % (half of the time)    Physical Activity >75% (most of the time)    Medications >75% (most of the time)    Monitoring >75% (most of the time)    Reducing Risk (treating acute and chronic complications) 25 - 42% (sometimes)    Health Coping 25 - 50% (sometimes)      Post-Education Assessment   Patient understands the diabetes disease and treatment process. Demonstrates understanding / competency    Patient understands incorporating nutritional management into lifestyle. Comprehends key points    Patient undertands incorporating physical activity into lifestyle. Comprehends key points    Patient understands using medications safely. Comphrehends key points  Patient understands monitoring blood glucose, interpreting and using results Comprehends key points    Patient understands prevention, detection, and treatment of acute complications. Comprehends key points    Patient understands prevention, detection, and treatment of chronic complications. Comprehends key points    Patient understands how to develop strategies to address psychosocial issues. Needs Review    Patient understands how to develop strategies to promote health/change behavior. Needs Review   demonstrates interest in learning but fear inhibits change     Outcomes   Expected Outcomes Other (comment)    Future DMSE 3-4 months    Program Status Not Completed      Subsequent Visit   Since your last visit have you continued or begun to take your medications as prescribed? Yes    Since your last visit have you experienced any weight changes? Loss    Weight Loss (lbs) 16             Individualized Plan for Diabetes Self-Management Training:   Learning Objective:  Patient will have a greater understanding of diabetes self-management. Patient education plan is to attend  individual and/or group sessions per assessed needs and concerns.   Plan:   Patient Instructions  Work on a relationship with food.  We need carbohydrates for fuel for our body and brain.  Aim to eat 15 grams of carbohydrate with each meal.      Expected Outcomes:  Other (comment)  Education material provided:   If problems or questions, patient to contact team via:  Phone  Future DSME appointment: 3-4 months

## 2022-10-30 NOTE — Progress Notes (Shared)
[facility] Note  11/12/2022     CHIEF COMPLAINT Patient presents for No chief complaint on file.   HISTORY OF PRESENT ILLNESS: [name] is a 66 y.o. female who presents to the clinic today for:    Patient states her fol and floaters have not gotten any better, but they have not gotten worse either  Referring physician: Bernerd Limbo, MD [address],  [place] [id number]  HISTORICAL INFORMATION:   Selected notes from the MEDICAL RECORD NUMBER Referred by Dr. Virgina Evener for PVD OD in pt with history of RT s/p laser retinopexy OD in [place]: Current Outpatient Medications (Ophthalmic Drugs)  Medication Sig   polyvinyl alcohol (LIQUIFILM TEARS) 1.4 % ophthalmic solution Place 1 drop into the left eye 2 (two) times daily as needed for dry eyes.   No current facility-administered medications for this visit. (Ophthalmic Drugs)   Current Outpatient Medications (Other)  Medication Sig   alprazolam (XANAX) 2 MG tablet Take 2 mg by mouth See admin instructions. Take 1 1/2 tablets (3 mg) by mouth daily at bedtime, may also take 1/2 tablet (1 mg) during the day as needed for anxiety   aspirin EC 81 MG tablet Take 81 mg by mouth See admin instructions. Take one tablet (81 mg) by mouth every other night.  Swallow whole.   blood glucose meter kit and supplies KIT Dispense based on patient and insurance preference. Use up to four times daily as directed.   clobetasol ointment (TEMOVATE) 5.42 % Apply 1 application topically 2 (two) times daily as needed (itching/psoriasis).   Continuous Blood Gluc Receiver (FREESTYLE LIBRE 2 READER) DEVI USE AS DIRECTED   Continuous Blood Gluc Sensor (FREESTYLE LIBRE 2 SENSOR) MISC 1 Device by Does not apply route every 14 (fourteen) days.   HYDROcodone-acetaminophen (NORCO) 5-325 MG per tablet Take 1 tablet by mouth 2 (two) times daily as needed (pain).   hydrocortisone  (ANUSOL-HC) 2.5 % rectal cream Place 1 application. rectally 2 (two) times daily.   hydrocortisone 2.5 % cream Apply 1 application topically 2 (two) times daily. Apply to face   insulin glargine (LANTUS) 100 UNIT/ML Solostar Pen Inject 12 Units into the skin every morning. (Patient not taking: Reported on 10/24/2022)   metFORMIN (GLUCOPHAGE) 500 MG tablet Take 1 tablet (500 mg total) by mouth 2 (two) times daily with a meal.   nystatin (MYCOSTATIN) 100000 UNIT/ML suspension Take 5 mLs by mouth 4 (four) times daily. Swish and spit   polyethylene glycol (MIRALAX / GLYCOLAX) 17 g packet Take 17 g by mouth 2 (two) times daily. Mix in 8 oz liquid and drink   triamcinolone ointment (KENALOG) 0.1 % Apply 1 application topically 2 (two) times daily as needed (itching/psoriasis).   Vitamin D, Ergocalciferol, (DRISDOL) 1.25 MG (50000 UNIT) CAPS capsule Take 50,000 Units by mouth every Wednesday.   No current facility-administered medications for this visit. (Other)   REVIEW OF SYSTEMS:    ALLERGIES Allergies  Allergen Reactions   Codeine Nausea Only   Diphenhydramine Hcl Other (See Comments)    Makes heart race   Nasal Decongestant [Pseudoephedrine] Other (See Comments)    Nervousness, insomnia   PAST MEDICAL HISTORY Past Medical History:  Diagnosis Date   Anxiety    Bipolar disorder ([place])    Chest pain    Myocardial Perfusion Study 02/08/02 - Abnormal study suggesting ischemia in anterior to anteroseptal wall to mid to upper  mid ventricular region w/associated hypocontractibility. Clinical correlation is recommended. EF=46% (Dr. 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 Small amount of  pericardial fluid in the pericardial recess adjacent to the ascending aorta   Rectocele, female    SUI (stress urinary incontinence, female)    Tachycardia    Past Surgical History:  Procedure Laterality Date   ABDOMINAL HYSTERECTOMY  1982   TAH   ANKLE SURGERY Right 1998   BACK SURGERY     CARDIAC CATHETERIZATION  03/15/02   No evidence of CAD   CHOLECYSTECTOMY  1998   KNEE SURGERY Right 1985   LAMINECTOMY     OOPHORECTOMY Left 1998   LSO - Benign tumor   POSTERIOR REPAIR  1985   FAMILY HISTORY Family History  Problem Relation Age of Onset   Diabetes Paternal Grandmother 54   Cancer Paternal Grandmother 37   Heart disease Paternal Grandfather    Heart attack Paternal Grandfather 20   Cancer Maternal Grandmother 61   Heart attack Maternal Grandfather 58   Heart defect Child    Diabetes Father    Heart disease Father    Heart attack Father    Lymphoma Maternal Uncle    Rheumatic fever Maternal Uncle    Heart attack Cousin 9   SOCIAL HISTORY Social History   Tobacco Use   Smoking status: Former    Packs/day: 1.00    Years: 23.00    Total pack years: 23.00    Types: Cigarettes   Smokeless tobacco: Never  Vaping Use   Vaping Use: Every day   Substances: Nicotine  Substance Use Topics   Alcohol use: Yes    Alcohol/week: 0.0 standard drinks of alcohol    Comment: occ   Drug use: No       OPHTHALMIC EXAM:   Not recorded     IMAGING AND PROCEDURES  Imaging and Procedures for _0 @          ASSESSMENT/PLAN:    ICD-10-CM   1. Posterior vitreous detachment of both eyes  H43.813     2. Diabetes mellitus type 2 without retinopathy ([place])  E11.9     3. History of repair of retinal tear by laser photocoagulation  Z98.890     4. Combined forms of age-related cataract of both eyes  H25.813     5. Posterior vitreous detachment of right eye  H43.811       1. PVD / vitreous syneresis OU  - OD onset Apr 2021 -- stable  - OS acute onset 4.19.23 --  +flashes and floaters -- mildly persistent  - Discussed findings and prognosis  - No RT or RD on 360 scleral depressed exam OS  - Reviewed s/s of RT/RD  - Strict return precautions for any such RT/RD signs/symptoms  - f/u 6 months, DFE, OCT  2. Diabetes mellitus, type 2 without retinopathy  - latest A1c is 5.4 on 04.20.23  - relatively new diagnosis -- diagnosed May 2022 -- hospitalized w/ BG >600 and A1c 11.3 - The incidence, risk factors for progression, natural history and treatment options for diabetic retinopathy  were discussed with  patient.   - The need for close monitoring of blood glucose, blood pressure, and serum lipids, avoiding cigarette or any type of tobacco, and the need for long term follow up was also discussed with patient. - f/u in 6 mos, sooner prn  3. History of retinal tear OD  - s/p laser ([place])  4. Mixed cataracts OU  - The symptoms of cataract, surgical options, and treatments and risks were discussed with patient.  - discussed diagnosis and progression  - not yet visually significant  - monitor  Ophthalmic Meds Ordered this visit:  No orders of the defined types were placed in this encounter.    No follow-ups on file.  There are no Patient Instructions on file for this visit.   This document serves as a record of services personally performed by Gardiner Sleeper, MD, PhD. It was created on their behalf by Renaldo Reel, [place] an ophthalmic technician. The creation of this record is the provider's dictation and/or activities during the visit.    Electronically signed by:  Renaldo Reel, COT  12.20.23 11:06 AM   Gardiner Sleeper, M.D., Ph.D. Diseases & Surgery of the Retina and Vitreous [facility]: M myopia (nearsighted); A astigmatism; H hyperopia (farsighted); P presbyopia; Mrx spectacle prescription;  CTL contact lenses; OD right eye; OS left eye; OU both eyes  XT exotropia; ET  esotropia; PEK punctate epithelial keratitis; PEE punctate epithelial erosions; DES dry eye syndrome; MGD meibomian gland dysfunction; ATs artificial tears; PFAT's preservative free artificial tears; [place] nuclear sclerotic cataract; PSC posterior subcapsular cataract; ERM epi-retinal membrane; PVD posterior vitreous detachment; RD retinal detachment; DM diabetes mellitus; DR diabetic retinopathy; NPDR non-proliferative diabetic retinopathy; PDR proliferative diabetic retinopathy; CSME clinically significant macular edema; DME diabetic macular edema; dbh dot blot hemorrhages; CWS cotton wool spot; POAG primary open angle glaucoma; C/D cup-to-disc ratio; HVF humphrey visual field; GVF goldmann visual field; OCT optical coherence tomography; IOP intraocular pressure; BRVO Branch retinal vein occlusion; CRVO central retinal vein occlusion; CRAO central retinal artery occlusion; BRAO branch retinal artery occlusion; RT retinal tear; SB scleral buckle; PPV pars plana vitrectomy; VH Vitreous hemorrhage; PRP panretinal laser photocoagulation; IVK intravitreal kenalog; VMT vitreomacular traction; MH Macular hole;  NVD neovascularization of the disc; NVE neovascularization elsewhere; AREDS age related eye disease study; ARMD age related macular degeneration; POAG primary open angle glaucoma; EBMD epithelial/anterior basement membrane dystrophy; ACIOL anterior chamber intraocular lens; IOL intraocular lens; PCIOL posterior chamber intraocular lens; Phaco/IOL phacoemulsification with intraocular lens placement; [place] photorefractive keratectomy; LASIK laser assisted in situ keratomileusis; HTN hypertension; DM diabetes mellitus; COPD chronic obstructive pulmonary disease

## 2022-11-12 ENCOUNTER — Encounter (INDEPENDENT_AMBULATORY_CARE_PROVIDER_SITE_OTHER): Payer: HMO | Admitting: Ophthalmology

## 2022-11-12 DIAGNOSIS — Z9889 Other specified postprocedural states: Secondary | ICD-10-CM

## 2022-11-12 DIAGNOSIS — E119 Type 2 diabetes mellitus without complications: Secondary | ICD-10-CM

## 2022-11-12 DIAGNOSIS — H43813 Vitreous degeneration, bilateral: Secondary | ICD-10-CM

## 2022-11-12 DIAGNOSIS — H25813 Combined forms of age-related cataract, bilateral: Secondary | ICD-10-CM

## 2022-11-12 DIAGNOSIS — H43811 Vitreous degeneration, right eye: Secondary | ICD-10-CM

## 2022-11-13 ENCOUNTER — Other Ambulatory Visit: Payer: Self-pay | Admitting: *Deleted

## 2022-11-13 DIAGNOSIS — M79606 Pain in leg, unspecified: Secondary | ICD-10-CM

## 2022-11-15 ENCOUNTER — Encounter (INDEPENDENT_AMBULATORY_CARE_PROVIDER_SITE_OTHER): Payer: Self-pay | Admitting: Ophthalmology

## 2022-11-25 ENCOUNTER — Encounter: Payer: HMO | Admitting: Surgery

## 2022-11-25 ENCOUNTER — Ambulatory Visit (HOSPITAL_COMMUNITY): Payer: HMO

## 2022-12-26 ENCOUNTER — Ambulatory Visit: Payer: HMO | Admitting: Dietician

## 2023-01-02 ENCOUNTER — Other Ambulatory Visit (INDEPENDENT_AMBULATORY_CARE_PROVIDER_SITE_OTHER): Payer: HMO

## 2023-01-02 DIAGNOSIS — E089 Diabetes mellitus due to underlying condition without complications: Secondary | ICD-10-CM | POA: Diagnosis not present

## 2023-01-02 DIAGNOSIS — E785 Hyperlipidemia, unspecified: Secondary | ICD-10-CM | POA: Diagnosis not present

## 2023-01-02 DIAGNOSIS — K8689 Other specified diseases of pancreas: Secondary | ICD-10-CM

## 2023-01-03 LAB — COMPREHENSIVE METABOLIC PANEL
ALT: 23 U/L (ref 0–35)
AST: 25 U/L (ref 0–37)
Albumin: 4.9 g/dL (ref 3.5–5.2)
Alkaline Phosphatase: 44 U/L (ref 39–117)
BUN: 21 mg/dL (ref 6–23)
CO2: 31 mEq/L (ref 19–32)
Calcium: 10.7 mg/dL — ABNORMAL HIGH (ref 8.4–10.5)
Chloride: 98 mEq/L (ref 96–112)
Creatinine, Ser: 0.82 mg/dL (ref 0.40–1.20)
GFR: 73.89 mL/min (ref >60.00)
Glucose, Bld: 87 mg/dL (ref 70–99)
Potassium: 3.9 mEq/L (ref 3.5–5.1)
Sodium: 138 mEq/L (ref 135–145)
Total Bilirubin: 0.4 mg/dL (ref 0.2–1.2)
Total Protein: 7.9 g/dL (ref 6.0–8.3)

## 2023-01-03 LAB — LIPID PANEL
Cholesterol: 192 mg/dL (ref 0–200)
HDL: 81.2 mg/dL (ref >39.00)
LDL Cholesterol: 94 mg/dL (ref 0–99)
NonHDL: 110.42
Total CHOL/HDL Ratio: 2
Triglycerides: 81 mg/dL (ref 0.0–149.0)
VLDL: 16.2 mg/dL (ref 0.0–40.0)

## 2023-01-03 LAB — HEMOGLOBIN A1C: Hgb A1c MFr Bld: 5.4 % (ref 4.6–6.5)

## 2023-01-06 ENCOUNTER — Encounter: Payer: Self-pay | Admitting: Endocrinology

## 2023-01-06 ENCOUNTER — Ambulatory Visit (INDEPENDENT_AMBULATORY_CARE_PROVIDER_SITE_OTHER): Payer: PPO | Admitting: Endocrinology

## 2023-01-06 VITALS — BP 110/66 | HR 58 | Ht 68.0 in | Wt 133.8 lb

## 2023-01-06 DIAGNOSIS — K8689 Other specified diseases of pancreas: Secondary | ICD-10-CM | POA: Diagnosis not present

## 2023-01-06 DIAGNOSIS — E089 Diabetes mellitus due to underlying condition without complications: Secondary | ICD-10-CM | POA: Diagnosis not present

## 2023-01-06 NOTE — Progress Notes (Addendum)
Patient ID: [name], female   DOB: 01-29-1956, 67 y.o.   MRN: [id number]           Reason for Appointment: Type II Diabetes follow-up   History of Present Illness   Diagnosis date: 2022  Previous history:  Non-insulin hypoglycemic drugs previously used: Metformin Insulin was started in 5/22 during an admission for hyperosmolar hyperglycemic state and glucose of 1109 She was discharged home on 35 units of Lantus but no mealtime insulin and her hyperglycemia was felt to be likely from acute pancreatitis of unclear etiology  Subsequently her blood sugars appeared to improve very rapidly and as an outpatient she was taking only small doses of basal insulin  A1c range since 05/2021 has been 5.1-6.1; at diagnosis it was 11.3  Recent history:     Non-insulin hypoglycemic drugs: Metformin 500 mg daily at dinnertime     Side effects from medications: None  Current self management, blood sugar patterns and problems identified:  A1c is 5.4 and about the same as usual She was told to stop her morning metformin after her last visit because of fairly normal blood sugars and this has not affected her level of control during the day Overnight blood sugars are in the normal range consistently and only minimal dawn phenomenon No hypoglycemic symptoms overnight also Her weight is about the same although she is concerned about adding more calories for weight gain such as an apple with peanut butter at night Her A1c has been in the normal range since 12/22 Overnight blood sugars are stable in the 90s There is no postprandial hyperglycemia at any time with blood sugars basically flat throughout the day and lab glucose was similar at 87 She was told to apply the sensor on her inner arm  She was last seen by the nutritionist in June  Exercise: Regular walking or aerobic dancing for up to 10-20 minutes a day  Diet management: She is cutting back on her calories and reportedly consuming only  1400 cal a day Also has gone to 20 g of carbohydrate at breakfast and lunch and about 30 at dinner meal She is also adding protein to her meals, breakfast is usually an Atkins shake     Hypoglycemia:  none             CGM for the freestyle libre version 2 for the last 2 weeks shows the following data  Overnight blood sugars are the lowest of the day averaging in the 90s and as low as 88 at 6 AM along with some minimal HYPOGLYCEMIA which may be partly artifactual and asymptomatic She does have a mild dawn phenomenon between 8-10 AM However FASTING blood sugars averaging 95 around 9 AM There is no postprandial hyperglycemia with highest average blood sugar 109 after dinner and 107 after lunch Premeal blood sugars are in the low 100 range consistently the rest of the day  Average blood sugar from sensor for the last 2 weeks = 99 with GMI 5.7 with unchanged GMI from before   Dietician visit: Most recent: 12/23     Weight control: Maximum weight previously 192  Wt Readings from Last 3 Encounters:  01/06/23 133 lb 12.8 oz (60.7 kg)  10/24/22 132 lb (59.9 kg)  08/05/22 137 lb (62.1 kg)            Diabetes labs:  Lab Results  Component Value Date   HGBA1C 5.4 01/02/2023   HGBA1C 5.3 05/27/2022   HGBA1C 5.4 02/28/2022  Lab Results  Component Value Date   LDLCALC 94 01/02/2023   CREATININE 0.82 01/02/2023    No results found for: "FRUCTOSAMINE"   Allergies as of 01/06/2023       Reactions   Codeine Nausea Only   Diphenhydramine Hcl Other (See Comments)   Makes heart race   Nasal Decongestant [pseudoephedrine] Other (See Comments)   Nervousness, insomnia        Medication List        Accurate as of January 06, 2023  3:21 PM. If you have any questions, ask your nurse or doctor.          alprazolam 2 MG tablet Commonly known as: XANAX Take 2 mg by mouth See admin instructions. Take 1 1/2 tablets (3 mg) by mouth daily at bedtime, may also take 1/2 tablet (1 mg)  during the day as needed for anxiety   aspirin EC 81 MG tablet Take 81 mg by mouth See admin instructions. Take one tablet (81 mg) by mouth every other night.  Swallow whole.   blood glucose meter kit and supplies Kit Dispense based on patient and insurance preference. Use up to four times daily as directed.   clobetasol ointment 0.05 % Commonly known as: TEMOVATE Apply 1 application topically 2 (two) times daily as needed (itching/psoriasis).   FreeStyle Libre 2 Reader Hardie Pulley USE AS DIRECTED   FreeStyle Libre 2 Sensor Misc 1 Device by Does not apply route every 14 (fourteen) days.   HYDROcodone-acetaminophen 5-325 MG tablet Commonly known as: NORCO/VICODIN Take 1 tablet by mouth 2 (two) times daily as needed (pain).   hydrocortisone 2.5 % cream Apply 1 application topically 2 (two) times daily. Apply to face   hydrocortisone 2.5 % rectal cream Commonly known as: ANUSOL-HC Place 1 application. rectally 2 (two) times daily.   insulin glargine 100 UNIT/ML Solostar Pen Commonly known as: LANTUS Inject 12 Units into the skin every morning.   metFORMIN 500 MG tablet Commonly known as: GLUCOPHAGE Take 1 tablet (500 mg total) by mouth 2 (two) times daily with a meal. What changed: when to take this   nystatin 100000 UNIT/ML suspension Commonly known as: MYCOSTATIN Take 5 mLs by mouth 4 (four) times daily. Swish and spit   polyethylene glycol 17 g packet Commonly known as: MIRALAX / GLYCOLAX Take 17 g by mouth 2 (two) times daily. Mix in 8 oz liquid and drink   polyvinyl alcohol 1.4 % ophthalmic solution Commonly known as: LIQUIFILM TEARS Place 1 drop into the left eye 2 (two) times daily as needed for dry eyes.   triamcinolone ointment 0.1 % Commonly known as: KENALOG Apply 1 application topically 2 (two) times daily as needed (itching/psoriasis).   Vitamin D (Ergocalciferol) 1.25 MG (50000 UNIT) Caps capsule Commonly known as: DRISDOL Take 50,000 Units by mouth every  Wednesday.        Allergies:  Allergies  Allergen Reactions   Codeine Nausea Only   Diphenhydramine Hcl Other (See Comments)    Makes heart race   Nasal Decongestant [Pseudoephedrine] Other (See Comments)    Nervousness, insomnia    Past Medical History:  Diagnosis Date   Anxiety    Bipolar disorder (HCC)    Chest pain    Myocardial Perfusion Study 02/08/02 - Abnormal study suggesting ischemia in anterior to anteroseptal wall to mid to upper mid ventricular region w/associated hypocontractibility. Clinical correlation is recommended. EF=46% (Dr. Bishop Limbo)   Cystic mesothelioma    LARGE MESOTHELIAL CYST   Decreased  pulse    Lower arterial duplex scan - 02/18/08 - NORMAL   Depression    Diabetes mellitus without complication (HCC)    HSV infection 07/13/1979   Mental disorder    Ovarian neoplasm    LARGE OVARIAN NEOPLASM   Palpitations    Cardiac CTA 03/31/08 - mild cardiomegaly. Small amount of pericardial fluid in the pericardial recess adjacent to the ascending aorta   Rectocele, female    SUI (stress urinary incontinence, female)    Tachycardia     Past Surgical History:  Procedure Laterality Date   ABDOMINAL HYSTERECTOMY  1982   TAH   ANKLE SURGERY Right 1998   BACK SURGERY     CARDIAC CATHETERIZATION  03/15/02   No evidence of CAD   CHOLECYSTECTOMY  1998   KNEE SURGERY Right 1985   LAMINECTOMY     OOPHORECTOMY Left 1998   LSO - Benign tumor   POSTERIOR REPAIR  1985    Family History  Problem Relation Age of Onset   Diabetes Paternal Grandmother 31   Cancer Paternal Grandmother 42   Heart disease Paternal Grandfather    Heart attack Paternal Grandfather 82   Cancer Maternal Grandmother 61   Heart attack Maternal Grandfather 58   Heart defect Child    Diabetes Father    Heart disease Father    Heart attack Father    Lymphoma Maternal Uncle    Rheumatic fever Maternal Uncle    Heart attack Cousin 26    Social History:  reports that she has quit  smoking. Her smoking use included cigarettes. She has a 23.00 pack-year smoking history. She has never used smokeless tobacco. She reports current alcohol use. She reports that she does not use drugs.  Review of Systems:   Last foot exam date: 08/2021   Hypertension: Not present  BP Readings from Last 3 Encounters:  01/06/23 110/66  08/05/22 98/66  05/27/22 116/78    Lipid management: She has had normal cholesterol   She has been prescribed 10 mg Lipitor by her PCP    Lab Results  Component Value Date   CHOL 192 01/02/2023   CHOL 147 03/12/2021   Lab Results  Component Value Date   HDL 81.20 01/02/2023   HDL 34 (L) 03/12/2021   Lab Results  Component Value Date   LDLCALC 94 01/02/2023   LDLCALC 70 03/12/2021   Lab Results  Component Value Date   TRIG 81.0 01/02/2023   TRIG 216 (H) 03/12/2021   Lab Results  Component Value Date   CHOLHDL 2 01/02/2023   CHOLHDL 4.3 03/12/2021   No results found for: "LDLDIRECT"  Her PCP diagnosed her with thyroiditis and an ultrasound only showed heterogenous thyroid tissue and only insignificant subcentimeter nodule Thyroid functions   Lab Results  Component Value Date   TSH 0.40 03/05/2022   She has been treated for psoriasis including in her feet    Examination:   BP 110/66 (BP Location: Left Arm, Patient Position: Sitting, Cuff Size: Small)   Pulse (!) 58   Ht 5\' 8"  (1.727 m)   Wt 133 lb 12.8 oz (60.7 kg)   SpO2 95%   BMI 20.34 kg/m   Body mass index is 20.34 kg/m.    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  Current regimen: Metformin 500 mg once daily only  See history of present illness for detailed discussion of current diabetes management, blood sugar patterns and problems identified  A1c is  consistently in the normal range, now 5.4 %  Appears that she has fairly normal insulin kinetics and sensitivity on her own with minimal dose of metformin indicating her hyperglycemia  previously was entirely related to pancreatitis episode Also discussed that her BMI is low normal and she can increase her overall carbohydrate or calorie intake without difficulty  Recommendations:  No change in medications She will maintain her caloric intake unchanged All questions answered today Follow-up in 5 months   There are no Patient Instructions on file for this visit.   Reather Littler 01/06/2023, 3:21 PM

## 2023-01-08 ENCOUNTER — Encounter: Payer: Self-pay | Admitting: Endocrinology

## 2023-01-20 ENCOUNTER — Ambulatory Visit (INDEPENDENT_AMBULATORY_CARE_PROVIDER_SITE_OTHER): Payer: PPO | Admitting: Ophthalmology

## 2023-01-20 ENCOUNTER — Encounter (INDEPENDENT_AMBULATORY_CARE_PROVIDER_SITE_OTHER): Payer: HMO | Admitting: Ophthalmology

## 2023-01-20 DIAGNOSIS — H25813 Combined forms of age-related cataract, bilateral: Secondary | ICD-10-CM | POA: Diagnosis not present

## 2023-01-20 DIAGNOSIS — Z9889 Other specified postprocedural states: Secondary | ICD-10-CM

## 2023-01-20 DIAGNOSIS — H43813 Vitreous degeneration, bilateral: Secondary | ICD-10-CM

## 2023-01-20 DIAGNOSIS — E119 Type 2 diabetes mellitus without complications: Secondary | ICD-10-CM

## 2023-01-20 DIAGNOSIS — H43811 Vitreous degeneration, right eye: Secondary | ICD-10-CM

## 2023-01-20 NOTE — Progress Notes (Signed)
[facility] - Clinic Note  01/20/2023     CHIEF COMPLAINT Patient presents for Retina Follow Up   HISTORY OF PRESENT ILLNESS: [name] is a 67 y.o. female who presents to the clinic today for:   HPI     Retina Follow Up   In both eyes.  This started 9 months ago.  Duration of 9 months.  Since onset it is stable.  I, the attending physician,  performed the HPI with the patient and updated documentation appropriately.        Comments   9 month retina follow up PVD OU pt is reporting vision maybe little worse she is still seeing some floaters denies any flashes she has appointment in a few weeks with Dr. Hazle Quant       Last edited by Rennis Chris, MD on 01/21/2023  4:58 PM.    Patient states her right eye is weaker than it was, she has an appt at [facility] next week, pt states she is off insulin, she has lost 62 lbs  Referring physician: Chucky May, M.D., PA [address] [place],  [place] [address]  HISTORICAL INFORMATION:   Selected notes from the MEDICAL RECORD NUMBER Referred by Dr. Everardo Pacific for PVD OD in pt with history of RT s/p laser retinopexy OD in 1985   CURRENT MEDICATIONS: Current Outpatient Medications (Ophthalmic Drugs)  Medication Sig   polyvinyl alcohol (LIQUIFILM TEARS) 1.4 % ophthalmic solution Place 1 drop into the left eye 2 (two) times daily as needed for dry eyes.   No current facility-administered medications for this visit. (Ophthalmic Drugs)   Current Outpatient Medications (Other)  Medication Sig   alprazolam (XANAX) 2 MG tablet Take 2 mg by mouth See admin instructions. Take 1 1/2 tablets (3 mg) by mouth daily at bedtime, may also take 1/2 tablet (1 mg) during the day as needed for anxiety   aspirin EC 81 MG tablet Take 81 mg by mouth See admin instructions. Take one tablet (81 mg) by mouth every other night.  Swallow whole.   blood glucose meter kit and supplies KIT Dispense based on patient  and insurance preference. Use up to four times daily as directed.   clobetasol ointment (TEMOVATE) 0.05 % Apply 1 application topically 2 (two) times daily as needed (itching/psoriasis).   Continuous Blood Gluc Receiver (FREESTYLE LIBRE 2 READER) DEVI USE AS DIRECTED   Continuous Blood Gluc Sensor (FREESTYLE LIBRE 2 SENSOR) MISC 1 Device by Does not apply route every 14 (fourteen) days.   HYDROcodone-acetaminophen (NORCO) 5-325 MG per tablet Take 1 tablet by mouth 2 (two) times daily as needed (pain).   hydrocortisone (ANUSOL-HC) 2.5 % rectal cream Place 1 application. rectally 2 (two) times daily.   hydrocortisone 2.5 % cream Apply 1 application topically 2 (two) times daily. Apply to face   metFORMIN (GLUCOPHAGE) 500 MG tablet Take 1 tablet (500 mg total) by mouth 2 (two) times daily with a meal. (Patient taking differently: Take 500 mg by mouth daily after supper.)   nystatin (MYCOSTATIN) 100000 UNIT/ML suspension Take 5 mLs by mouth 4 (four) times daily. Swish and spit   polyethylene glycol (MIRALAX / GLYCOLAX) 17 g packet Take 17 g by mouth 2 (two) times daily. Mix in 8 oz liquid and drink   triamcinolone ointment (KENALOG) 0.1 % Apply 1 application topically 2 (two) times daily as needed (itching/psoriasis).   Vitamin D, Ergocalciferol, (DRISDOL) 1.25 MG (50000 UNIT)  CAPS capsule Take 50,000 Units by mouth every Wednesday.   No current facility-administered medications for this visit. (Other)   REVIEW OF SYSTEMS: ROS   Positive for: Endocrine, Eyes Negative for: Constitutional, Gastrointestinal, Neurological, Skin, Genitourinary, Musculoskeletal, HENT, Cardiovascular, Respiratory, Psychiatric, Allergic/Imm, Heme/Lymph Last edited by Etheleen Mayhew, COT on 01/20/2023  2:36 PM.     ALLERGIES Allergies  Allergen Reactions   Codeine Nausea Only   Diphenhydramine Hcl Other (See Comments)    Makes heart race   Nasal Decongestant [Pseudoephedrine] Other (See Comments)     Nervousness, insomnia   PAST MEDICAL HISTORY Past Medical History:  Diagnosis Date   Anxiety    Bipolar disorder (HCC)    Chest pain    Myocardial Perfusion Study 02/08/02 - Abnormal study suggesting ischemia in anterior to anteroseptal wall to mid to upper mid ventricular region w/associated hypocontractibility. Clinical correlation is recommended. EF=46% (Dr. 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 Small amount of pericardial fluid in the pericardial recess adjacent to the ascending aorta   Rectocele, female    SUI (stress urinary incontinence, female)    Tachycardia    Past Surgical History:  Procedure Laterality Date   ABDOMINAL HYSTERECTOMY  1982   TAH   ANKLE SURGERY Right 1998   BACK SURGERY     CARDIAC CATHETERIZATION  03/15/02   No evidence of CAD   CHOLECYSTECTOMY  1998   KNEE SURGERY Right 1985   LAMINECTOMY     OOPHORECTOMY Left 1998   LSO - Benign tumor   POSTERIOR REPAIR  1985   FAMILY HISTORY Family History  Problem Relation Age of Onset   Diabetes Paternal Grandmother 38   Cancer Paternal Grandmother 39   Heart disease Paternal Grandfather    Heart attack Paternal Grandfather 60   Cancer Maternal Grandmother 61   Heart attack Maternal Grandfather 58   Heart defect Child    Diabetes Father    Heart disease Father    Heart attack Father    Lymphoma Maternal Uncle    Rheumatic fever Maternal Uncle    Heart attack Cousin 30   SOCIAL HISTORY Social History   Tobacco Use   Smoking status: Former    Packs/day: 1.00    Years: 23.00    Total pack years: 23.00    Types: Cigarettes   Smokeless tobacco: Never  Vaping Use   Vaping Use: Every day    Substances: Nicotine  Substance Use Topics   Alcohol use: Yes    Alcohol/week: 0.0 standard drinks of alcohol    Comment: occ   Drug use: No       OPHTHALMIC EXAM:   Base Eye Exam     Visual Acuity (Snellen - Linear)       Right Left   Dist cc 20/40 -2 20/30   Dist ph cc NI NI    Correction: Glasses         Tonometry (Tonopen, 2:40 PM)       Right Left   Pressure 18 16         Pupils       Pupils Dark  Light Shape React APD   Right PERRL 2 1 Round Brisk None   Left PERRL 2 1 Round Brisk None         Visual Fields       Left Right    Full Full         Extraocular Movement       Right Left    Full, Ortho Full, Ortho         Neuro/Psych     Oriented x3: Yes   Mood/Affect: Normal         Dilation     Both eyes: 2.5% Phenylephrine @ 2:40 PM           Slit Lamp and Fundus Exam     Slit Lamp Exam       Right Left   Lids/Lashes Dermatochalasis - upper lid, Meibomian gland dysfunction Dermatochalasis - upper lid, mild Meibomian gland dysfunction   Conjunctiva/Sclera White and quiet White and quiet   Cornea Arcus, mild tear film debris Arcus, trace tear film debris, trace Punctate epithelial erosions   Anterior Chamber Deep and quiet Deep and quiet   Iris Round and dilated Round and dilated   Lens 2-3+ Nuclear sclerosis with brunescence, 2-3+ Cortical cataract 2-3+ Nuclear sclerosis with brunescence, 2-3+ Cortical cataract   Anterior Vitreous Vitreous syneresis, Posterior vitreous detachment, mild vitreous condensations Vitreous syneresis, +PVD         Fundus Exam       Right Left   Disc Pink and Sharp, mild, temporal PPA, +PPP Pink and Sharp, +PPP, +cupping   C/D Ratio 0.6 0.7   Macula Flat, Good foveal reflex, Retinal pigment epithelial mottling, No heme or edema Flat, Good foveal reflex, mild Retinal pigment epithelial mottling, No heme or edema   Vessels mild attenuation, mild tortuosity, mild AV crossing changes attenuated,  Tortuous   Periphery Attached, pigmented laser scars at 0900; no RT/RD on exam Attached, no heme, no RT/RD on 360 exam           Refraction     Wearing Rx       Sphere Cylinder Axis Add   Right +5.00 -0.75 038 +1.50   Left +3.00 -1.00 002 +1.50            IMAGING AND PROCEDURES  Imaging and Procedures for @TODAY @  OCT, Retina - OU - Both Eyes       Right Eye Quality was good. Central Foveal Thickness: 242. Progression has been stable. Findings include normal foveal contour, no IRF, no SRF.   Left Eye Quality was good. Central Foveal Thickness: 236. Progression has been stable. Findings include normal foveal contour, no IRF, no SRF (stable release of VMA to full PVD).   Notes *Images captured and stored on drive  Diagnosis / Impression:  NFP, no IRF/SRF OU OS: stable release of VMA to full PVD  Clinical management:  See below  Abbreviations: NFP - Normal foveal profile. CME - cystoid macular edema. PED - pigment epithelial detachment. IRF - intraretinal fluid. SRF - subretinal fluid. EZ - ellipsoid zone. ERM - epiretinal membrane. ORA - outer retinal atrophy. ORT - outer retinal tubulation. SRHM - subretinal hyper-reflective material             ASSESSMENT/PLAN:    ICD-10-CM   1. Posterior vitreous detachment of both eyes  H43.813     2. Diabetes mellitus type 2 without retinopathy (HCC)  E11.9 OCT, Retina - OU - Both Eyes    3.  History of repair of retinal tear by laser photocoagulation  Z98.890     4. Combined forms of age-related cataract of both eyes  H25.813     5. Posterior vitreous detachment of right eye  H43.811      1. PVD / vitreous syneresis OU  - OD onset Apr 2021 -- stable  - OS acute onset 4.19.23 -- +flashes and floaters -- improved  - Discussed findings and prognosis  - No RT or RD on 360 scleral depressed exam OS  - Reviewed s/s of RT/RD  - Strict return precautions for any such RT/RD signs/symptoms  - pt is cleared from a  retina standpoint for release to [facility] and resumption of primary eye care   2. Diabetes mellitus, type 2 without retinopathy  - latest A1c is 5.4 on 02.22.24  - relatively new diagnosis -- diagnosed May 2022 -- hospitalized w/ BG >600 and A1c 11.3 - The incidence, risk factors for progression, natural history and treatment options for diabetic retinopathy  were discussed with patient.   - The need for close monitoring of blood glucose, blood pressure, and serum lipids, avoiding cigarette or any type of tobacco, and the need for long term follow up was also discussed with patient. - pt to continue annual diabetic eye exams at [facility]  3. History of retinal tear OD  - s/p laser [facility])  - stable  4. Mixed cataracts OU  - The symptoms of cataract, surgical options, and treatments and risks were discussed with patient.  - discussed diagnosis and progression  - monitor  Ophthalmic Meds Ordered this visit:  No orders of the defined types were placed in this encounter.    Return if symptoms worsen or fail to improve.  There are no Patient Instructions on file for this visit.   Explained the diagnoses, plan, and follow up with the patient and they expressed understanding.  Patient expressed understanding of the importance of proper follow up care.   This document serves as a record of services personally performed by Karie Chimera, MD, PhD. It was created on their behalf by Glee Arvin. Manson Passey, OA an ophthalmic technician. The creation of this record is the provider's dictation and/or activities during the visit.    Electronically signed by: Glee Arvin. Manson Passey, [place] 03.11.2024 4:59 PM  Karie Chimera, M.D., Ph.D. Diseases & Surgery of the Retina and Vitreous [facility]  I have reviewed the above documentation for accuracy and completeness, and I agree with the above. Karie Chimera, M.D., Ph.D. 01/21/23 5:01  PM   Abbreviations: M myopia (nearsighted); A astigmatism; H hyperopia (farsighted); P presbyopia; Mrx spectacle prescription;  CTL contact lenses; OD right eye; OS left eye; OU both eyes  XT exotropia; ET esotropia; PEK punctate epithelial keratitis; PEE punctate epithelial erosions; DES dry eye syndrome; MGD meibomian gland dysfunction; ATs artificial tears; PFAT's preservative free artificial tears; NSC nuclear sclerotic cataract; PSC posterior subcapsular cataract; ERM epi-retinal membrane; PVD posterior vitreous detachment; RD retinal detachment; DM diabetes mellitus; DR diabetic retinopathy; NPDR non-proliferative diabetic retinopathy; PDR proliferative diabetic retinopathy; CSME clinically significant macular edema; DME diabetic macular edema; dbh dot blot hemorrhages; CWS cotton wool spot; POAG primary open angle glaucoma; C/D cup-to-disc ratio; HVF humphrey visual field; GVF goldmann visual field; OCT optical coherence tomography; IOP intraocular pressure; BRVO Branch retinal vein occlusion; CRVO central retinal vein occlusion; CRAO central retinal artery occlusion; BRAO branch retinal artery occlusion; RT retinal  tear; SB scleral buckle; PPV pars plana vitrectomy; VH Vitreous hemorrhage; PRP panretinal laser photocoagulation; IVK intravitreal kenalog; VMT vitreomacular traction; MH Macular hole;  NVD neovascularization of the disc; NVE neovascularization elsewhere; AREDS age related eye disease study; ARMD age related macular degeneration; POAG primary open angle glaucoma; EBMD epithelial/anterior basement membrane dystrophy; ACIOL anterior chamber intraocular lens; IOL intraocular lens; PCIOL posterior chamber intraocular lens; Phaco/IOL phacoemulsification with intraocular lens placement; PRK photorefractive keratectomy; LASIK laser assisted in situ keratomileusis; HTN hypertension; DM diabetes mellitus; COPD chronic obstructive pulmonary disease

## 2023-01-21 ENCOUNTER — Encounter (INDEPENDENT_AMBULATORY_CARE_PROVIDER_SITE_OTHER): Payer: Self-pay | Admitting: Ophthalmology

## 2023-01-23 ENCOUNTER — Ambulatory Visit: Payer: HMO | Admitting: Dietician

## 2023-01-27 ENCOUNTER — Ambulatory Visit (HOSPITAL_COMMUNITY)
Admission: RE | Admit: 2023-01-27 | Discharge: 2023-01-27 | Disposition: A | Discharge status: Home / Self Care | Payer: PPO | Source: Ambulatory Visit | Attending: Surgery | Admitting: Surgery

## 2023-01-27 ENCOUNTER — Encounter: Payer: Self-pay | Admitting: Surgery

## 2023-01-27 ENCOUNTER — Ambulatory Visit (INDEPENDENT_AMBULATORY_CARE_PROVIDER_SITE_OTHER): Payer: PPO | Admitting: Surgery

## 2023-01-27 VITALS — BP 120/70 | HR 68 | Temp 98.2°F | Resp 20 | Ht 68.0 in | Wt 131.0 lb

## 2023-01-27 DIAGNOSIS — I739 Peripheral vascular disease, unspecified: Secondary | ICD-10-CM

## 2023-01-27 DIAGNOSIS — M79606 Pain in leg, unspecified: Secondary | ICD-10-CM | POA: Insufficient documentation

## 2023-01-27 LAB — VAS US ABI WITH/WO TBI
Left ABI: 1.1
Right ABI: 1.19

## 2023-01-27 NOTE — Progress Notes (Signed)
Vascular and Vein Specialist of [facility]  Patient name: [name] MRN: [id number] DOB: [birth year] Sex: female   REQUESTING PROVIDER:    Dr. Coletta Memos   REASON FOR CONSULT:    PAD  HISTORY OF PRESENT ILLNESS:   [name] is a 67 y.o. female, who is referred for evaluation of peripheral vascular disease.  The patient states that she began having issues on July 03, 2021.  She was riding in the car down to the beach and she had pain in her toes which were very cold.  She has not had any other episodes like that but does suffer from a cold feeling in her feet which is mildly painful.  She does not have any open wounds.  She does not endorse claudication like symptoms   The patient is a diabetic.  She takes a statin for hypercholesterolemia.  She is a former smoker.  PAST MEDICAL HISTORY    Past Medical History:  Diagnosis Date   Anxiety    Bipolar disorder ([place])    Chest pain    Myocardial Perfusion Study 02/08/02 - Abnormal study suggesting ischemia in anterior to anteroseptal wall to mid to upper mid ventricular region w/associated hypocontractibility. Clinical correlation is recommended. EF=46% (Dr. 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 Small amount of pericardial fluid in the pericardial recess adjacent to the ascending aorta   Rectocele, female    SUI (stress urinary incontinence, female)    Tachycardia      FAMILY HISTORY   Family History  Problem Relation Age of Onset   Diabetes Paternal Grandmother 44   Cancer Paternal Grandmother 41   Heart disease Paternal Grandfather    Heart attack Paternal Grandfather 9    Cancer Maternal Grandmother 61   Heart attack Maternal Grandfather 58   Heart defect Child    Diabetes Father    Heart disease Father    Heart attack Father    Lymphoma Maternal Uncle    Rheumatic fever Maternal Uncle    Heart attack Cousin 48    SOCIAL HISTORY:   Social History   Socioeconomic History   Marital status: Married    Spouse name: Not on file   Number of children: Not on file   Years of education: Not on file   Highest education level: Not on file  Occupational History   Not on file  Tobacco Use   Smoking status: Former    Packs/day: 1.00    Years: 23.00    Additional pack years: 0.00    Total pack years: 23.00    Types: Cigarettes   Smokeless tobacco: Never  Vaping Use   Vaping Use: Every day   Substances: Nicotine  Substance and Sexual Activity   Alcohol use: Yes    Alcohol/week: 0.0 standard drinks of alcohol    Comment: occ   Drug use: No   Sexual activity: Yes    Birth control/protection: Post-menopausal    Comment: 1st intercourse 67 yo-More than 5 partners  Other Topics Concern   Not on file  Social History Narrative   Not on file   Social  Determinants of Health   Financial Resource Strain: Not on file  Food Insecurity: Not on file  Transportation Needs: Not on file  Physical Activity: Not on file  Stress: Not on file  Social Connections: Not on file  Intimate Partner Violence: Not on file    ALLERGIES:    Allergies  Allergen Reactions   Codeine Nausea Only   Diphenhydramine Hcl Other (See Comments)    Makes heart race   Nasal Decongestant [Pseudoephedrine] Other (See Comments)    Nervousness, insomnia    CURRENT MEDICATIONS:    Current Outpatient Medications  Medication Sig Dispense Refill   alprazolam (XANAX) 2 MG tablet Take 2 mg by mouth See admin instructions. Take 1 1/2 tablets (3 mg) by mouth daily at bedtime, may also take 1/2 tablet (1 mg) during the day as needed for anxiety     aspirin EC 81 MG tablet Take 81  mg by mouth See admin instructions. Take one tablet (81 mg) by mouth every other night.  Swallow whole.     blood glucose meter kit and supplies KIT Dispense based on patient and insurance preference. Use up to four times daily as directed. 1 each 0   clobetasol ointment (TEMOVATE) [id number] % Apply 1 application topically 2 (two) times daily as needed (itching/psoriasis).     Continuous Blood Gluc Receiver (FREESTYLE LIBRE 2 READER) DEVI USE AS DIRECTED 1 each 1   Continuous Blood Gluc Sensor (FREESTYLE LIBRE 2 SENSOR) MISC 1 Device by Does not apply route every 14 (fourteen) days. 6 each 3   HYDROcodone-acetaminophen (NORCO) 5-325 MG per tablet Take 1 tablet by mouth 2 (two) times daily as needed (pain).     hydrocortisone (ANUSOL-HC) 2.5 % rectal cream Place 1 application. rectally 2 (two) times daily. 30 g 0   hydrocortisone 2.5 % cream Apply 1 application topically 2 (two) times daily. Apply to face     metFORMIN (GLUCOPHAGE) 500 MG tablet Take 1 tablet (500 mg total) by mouth 2 (two) times daily with a meal. (Patient taking differently: Take 500 mg by mouth daily after supper.) 60 tablet 2   nystatin (MYCOSTATIN) 100000 UNIT/ML suspension Take 5 mLs by mouth 4 (four) times daily. Swish and spit     polyethylene glycol (MIRALAX / GLYCOLAX) 17 g packet Take 17 g by mouth 2 (two) times daily. Mix in 8 oz liquid and drink     polyvinyl alcohol (LIQUIFILM TEARS) 1.4 % ophthalmic solution Place 1 drop into the left eye 2 (two) times daily as needed for dry eyes.     triamcinolone ointment (KENALOG) 0.1 % Apply 1 application topically 2 (two) times daily as needed (itching/psoriasis).     Vitamin D, Ergocalciferol, (DRISDOL) 1.25 MG (50000 UNIT) CAPS capsule Take 50,000 Units by mouth every Wednesday.     No current facility-administered medications for this visit.    REVIEW OF SYSTEMS:   [X]  denotes positive finding, [ ]  denotes negative finding Cardiac  Comments:  Chest pain or chest pressure:     Shortness of breath upon exertion:    Short of breath when lying flat:    Irregular heart rhythm:        Vascular    Pain in calf, thigh, or hip brought on by ambulation:    Pain in feet at night that wakes you up from your sleep:     Blood clot in your veins:    Leg swelling:         Pulmonary  Oxygen at home:    Productive cough:     Wheezing:         Neurologic    Sudden weakness in arms or legs:     Sudden numbness in arms or legs:     Sudden onset of difficulty speaking or slurred speech:    Temporary loss of vision in one eye:     Problems with dizziness:         Gastrointestinal    Blood in stool:      Vomited blood:         Genitourinary    Burning when urinating:     Blood in urine:        Psychiatric    Major depression:         Hematologic    Bleeding problems:    Problems with blood clotting too easily:        Skin    Rashes or ulcers:        Constitutional    Fever or chills:     PHYSICAL EXAM:   There were no vitals filed for this visit.  GENERAL: The patient is a well-nourished female, in no acute distress. The vital signs are documented above. CARDIAC: There is a regular rate and rhythm.  VASCULAR: Palpable popliteal pulses.  I could not palpate pedal pulses PULMONARY: Nonlabored respirations MUSCULOSKELETAL: There are no major deformities or cyanosis. NEUROLOGIC: No focal weakness or paresthesias are detected. SKIN: There are no ulcers or rashes noted. PSYCHIATRIC: The patient has a normal affect.  STUDIES:   I have reviewed the following:  +-------+-----------+-----------+------------+------------+  ABI/TBIToday's ABIToday's TBIPrevious ABIPrevious TBI  +-------+-----------+-----------+------------+------------+  Right 1.19       0.69       0.95        0.68          +-------+-----------+-----------+------------+------------+  Left  1.10       0.61       0.85        0.68           +-------+-----------+-----------+------------+------------+  Right toe pressure:  75 Left toe pressure:  66 Waveforms are triphasic ASSESSMENT and PLAN   PAD: Noninvasive imaging show normal pressures down to the ankle.  Her toe pressures were diminished.  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  I offered her angiography, however I think that this would most likely be diagnostic and so I discouraged it.  She does have a history of Raynaud's phenomenon and her daughters.  We talked about treatment with nifedipine.  We also talked about further evaluation with rheumatology.  She is going to have further discussions with Dr. Delaney Meigs regarding this.  As of now, I would not recommend any further vascular interventions.  She knows to contact me should she develop a change in her symptoms.   Leia Alf, MD, FACS [facility] Specialists of [facility] [phone] Pager [phone]

## 2023-06-19 ENCOUNTER — Other Ambulatory Visit: Payer: Self-pay

## 2023-06-19 DIAGNOSIS — E119 Type 2 diabetes mellitus without complications: Secondary | ICD-10-CM

## 2023-06-19 MED ORDER — FREESTYLE LIBRE 2 SENSOR MISC: 1.0000 | 3 refills | Status: DC

## 2023-07-03 ENCOUNTER — Other Ambulatory Visit (INDEPENDENT_AMBULATORY_CARE_PROVIDER_SITE_OTHER): Payer: PPO

## 2023-07-03 DIAGNOSIS — K8689 Other specified diseases of pancreas: Secondary | ICD-10-CM

## 2023-07-03 DIAGNOSIS — E089 Diabetes mellitus due to underlying condition without complications: Secondary | ICD-10-CM

## 2023-07-04 LAB — BASIC METABOLIC PANEL
BUN: 23 mg/dL (ref 6–23)
CO2: 28 meq/L (ref 19–32)
Calcium: 9.9 mg/dL (ref 8.4–10.5)
Chloride: 100 meq/L (ref 96–112)
Creatinine, Ser: 0.82 mg/dL (ref 0.40–1.20)
GFR: 73.63 mL/min (ref >60.00)
Glucose, Bld: 93 mg/dL (ref 70–99)
Potassium: 4.2 meq/L (ref 3.5–5.1)
Sodium: 138 meq/L (ref 135–145)

## 2023-07-04 LAB — MICROALBUMIN / CREATININE URINE RATIO
Creatinine,U: 41 mg/dL
Microalb Creat Ratio: 1.7 mg/g (ref 0.0–30.0)
Microalb, Ur: <0.7 mg/dL (ref 0.0–1.9)

## 2023-07-04 LAB — HEMOGLOBIN A1C: Hgb A1c MFr Bld: 5.5 % (ref 4.6–6.5)

## 2023-07-07 ENCOUNTER — Ambulatory Visit: Payer: PPO | Admitting: Endocrinology

## 2023-07-07 ENCOUNTER — Encounter: Payer: Self-pay | Admitting: Endocrinology

## 2023-07-07 VITALS — BP 115/65 | HR 89 | Ht 68.0 in | Wt 132.6 lb

## 2023-07-07 DIAGNOSIS — K8689 Other specified diseases of pancreas: Secondary | ICD-10-CM

## 2023-07-07 DIAGNOSIS — E089 Diabetes mellitus due to underlying condition without complications: Secondary | ICD-10-CM | POA: Diagnosis not present

## 2023-07-07 DIAGNOSIS — R200 Anesthesia of skin: Secondary | ICD-10-CM | POA: Diagnosis not present

## 2023-07-07 NOTE — Progress Notes (Signed)
Patient ID: [name], female   DOB: [birth year], 67 y.o.   MRN: [id number]           Reason for Appointment: Diabetes follow-up   History of Present Illness   Diagnosis date: 2022  Previous history:  Non-insulin hypoglycemic drugs previously used: Metformin Insulin was started in 5/22 during an admission for hyperosmolar hyperglycemic state and glucose of 1109 She was discharged home on 35 units of Lantus but no mealtime insulin and her hyperglycemia was felt to be likely from acute pancreatitis of unclear etiology  Subsequently her blood sugars appeared to improve very rapidly and as an outpatient she was taking only small doses of basal insulin  A1c range since 05/2021 has been 5.1-6.1; at diagnosis it was 11.3  Recent history:     Non-insulin hypoglycemic drugs: Metformin 500 mg daily at dinnertime     Side effects from medications: None  Current self management, blood sugar patterns and problems identified:  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  A1c is 5.5 and about the same as before She is taking metformin in the evenings only However her fasting readings are consistently below 100 recently although she thinks that for about 2 weeks there were around 107 even with fingerstick She has minimal postprandial fluctuation and do not usually go over 120 She read consistent with her exercise She is also very consistent and strict with her diet with limiting portions and carbohydrates and she completely avoids sweets She is reluctant to stop using the sensor because she thinks she wants to keep monitoring blood sugar  She was last seen by the nutritionist in June  Exercise: Regular walking or aerobic dancing for up to 10-20 minutes a day  Diet management: She is cutting back on her calories and reportedly consuming only 1400 cal a day Also has gone to 20 g of carbohydrate at breakfast and lunch and about 30 at dinner meal She is also adding protein to  her meals, breakfast is usually an Atkins shake     Hypoglycemia:  none             CGM for the freestyle libre version 2 for the last 2 weeks shows the following data  Overnight blood sugars are the lowest of the 24 timeframe as before with average as low as 87 and only rare readings below 70 Average blood sugar during the day is consistently in the 90s with the highest 98 after breakfast She has no episodes of blood sugars below 65 during the day and rarely may have readings in the 66 range Postprandial readings are fairly level with Premeal readings  Average blood sugar from sensor for the last 2 weeks = 99, same as before with GMI 5.6 compared to 5.7, 1% readings below 70   Dietician visit: Most recent: 12/23     Weight control: Maximum weight previously 192  Wt Readings from Last 3 Encounters:  07/07/23 132 lb 9.6 oz (60.1 kg)  01/27/23 131 lb (59.4 kg)  01/06/23 133 lb 12.8 oz (60.7 kg)            Diabetes labs:  Lab Results  Component Value Date   HGBA1C 5.5 07/03/2023   HGBA1C 5.4 01/02/2023   HGBA1C 5.3 05/27/2022   Lab Results  Component Value Date   MICROALBUR <0.7 07/03/2023   LDLCALC 94 01/02/2023   CREATININE 0.82 07/03/2023    No results found for: "FRUCTOSAMINE"   Allergies as of 07/07/2023  Reactions   Codeine Nausea Only   Diphenhydramine Hcl Other (See Comments)   Makes heart race   Nasal Decongestant [pseudoephedrine] Other (See Comments)   Nervousness, insomnia        Medication List        Accurate as of July 07, 2023  1:34 PM. If you have any questions, ask your nurse or doctor.          alprazolam 2 MG tablet Commonly known as: XANAX Take 2 mg by mouth See admin instructions. Take 1 1/2 tablets (3 mg) by mouth daily at bedtime, may also take 1/2 tablet (1 mg) during the day as needed for anxiety   aspirin EC 81 MG tablet Take 81 mg by mouth See admin instructions. Take one tablet (81 mg) by mouth every other night.   Swallow whole.   blood glucose meter kit and supplies Kit Dispense based on patient and insurance preference. Use up to four times daily as directed.   clobetasol ointment 0.05 % Commonly known as: TEMOVATE Apply 1 application topically 2 (two) times daily as needed (itching/psoriasis).   FreeStyle Libre 2 Reader Hardie Pulley USE AS DIRECTED   FreeStyle Libre 2 Sensor Misc 1 Device by Does not apply route every 14 (fourteen) days.   HYDROcodone-acetaminophen 5-325 MG tablet Commonly known as: NORCO/VICODIN Take 1 tablet by mouth 2 (two) times daily as needed (pain).   hydrocortisone 2.5 % cream Apply 1 application topically 2 (two) times daily. Apply to face   hydrocortisone 2.5 % rectal cream Commonly known as: ANUSOL-HC Place 1 application. rectally 2 (two) times daily.   metFORMIN 500 MG tablet Commonly known as: GLUCOPHAGE Take 1 tablet (500 mg total) by mouth 2 (two) times daily with a meal. What changed: when to take this   nystatin 100000 UNIT/ML suspension Commonly known as: MYCOSTATIN Take 5 mLs by mouth 4 (four) times daily. Swish and spit   polyethylene glycol 17 g packet Commonly known as: MIRALAX / GLYCOLAX Take 17 g by mouth 2 (two) times daily. Mix in 8 oz liquid and drink   polyvinyl alcohol 1.4 % ophthalmic solution Commonly known as: LIQUIFILM TEARS Place 1 drop into the left eye 2 (two) times daily as needed for dry eyes.   triamcinolone ointment 0.1 % Commonly known as: KENALOG Apply 1 application topically 2 (two) times daily as needed (itching/psoriasis).   Vitamin D (Ergocalciferol) 1.25 MG (50000 UNIT) Caps capsule Commonly known as: DRISDOL Take 50,000 Units by mouth every Wednesday.        Allergies:  Allergies  Allergen Reactions   Codeine Nausea Only   Diphenhydramine Hcl Other (See Comments)    Makes heart race   Nasal Decongestant [Pseudoephedrine] Other (See Comments)    Nervousness, insomnia    Past Medical History:  Diagnosis  Date   Anxiety    Bipolar disorder ([facility])    Chest pain    Myocardial Perfusion Study 02/08/02 - Abnormal study suggesting ischemia in anterior to anteroseptal wall to mid to upper mid ventricular region w/associated hypocontractibility. Clinical correlation is recommended. EF=46% (Dr. 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  Small amount of pericardial fluid in the pericardial recess adjacent to the ascending aorta   Rectocele, female    SUI (stress urinary incontinence, female)    Tachycardia     Past Surgical History:  Procedure Laterality Date   ABDOMINAL HYSTERECTOMY  1982   TAH   ANKLE SURGERY Right 1998   BACK SURGERY     CARDIAC CATHETERIZATION  03/15/02   No evidence of CAD   CHOLECYSTECTOMY  1998   KNEE SURGERY Right 1985   LAMINECTOMY     OOPHORECTOMY Left 1998   LSO - Benign tumor   POSTERIOR REPAIR  1985    Family History  Problem Relation Age of Onset   Diabetes Paternal Grandmother 99   Cancer Paternal Grandmother 5   Heart disease Paternal Grandfather    Heart attack Paternal Grandfather 75   Cancer Maternal Grandmother 61   Heart attack Maternal Grandfather 58   Heart defect Child    Diabetes Father    Heart disease Father    Heart attack Father    Lymphoma Maternal Uncle    Rheumatic fever Maternal Uncle    Heart attack Cousin 47    Social History:  reports that she has quit smoking. Her smoking use included cigarettes. She has a 23 pack-year smoking history. She has never used smokeless tobacco. She reports current alcohol use. She reports that she does not use drugs.  Review of Systems:   Last foot exam date: 8/24   Hypertension: Not present  BP  Readings from Last 3 Encounters:  07/07/23 115/65  01/27/23 120/70  01/06/23 110/66    Lipid management: She has had normal cholesterol   She has been prescribed 10 mg Lipitor by her PCP    Lab Results  Component Value Date   CHOL 192 01/02/2023   CHOL 147 03/12/2021   Lab Results  Component Value Date   HDL 81.20 01/02/2023   HDL 34 (L) 03/12/2021   Lab Results  Component Value Date   LDLCALC 94 01/02/2023   LDLCALC 70 03/12/2021   Lab Results  Component Value Date   TRIG 81.0 01/02/2023   TRIG 216 (H) 03/12/2021   Lab Results  Component Value Date   CHOLHDL 2 01/02/2023   CHOLHDL 4.3 03/12/2021   No results found for: "LDLDIRECT"  Her PCP diagnosed her with thyroiditis and an ultrasound only showed heterogenous thyroid tissue and only insignificant subcentimeter nodule Thyroid functions   Lab Results  Component Value Date   TSH 0.40 03/05/2022   She has been treated for psoriasis including in her feet  She has mild persistent numbness of her right lateral distal foot secondary to lumbar spinal stenosis   Examination:   BP 115/65   Pulse 89   Ht 5\' 8"  (1.727 m)   Wt 132 lb 9.6 oz (60.1 kg)   SpO2 97%   BMI 20.16 kg/m   Body mass index is 20.16 kg/m.   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  Current regimen: Metformin 500 mg once daily only  See history of present illness for detailed discussion of current diabetes management, blood sugar patterns and problems identified  A1c is consistently in the normal range, now 5.5 %  She continues to  have fairly normal blood sugars, in the nondiabetic range now  Although she is only occasionally getting readings in the 60s overnight  occasionally this may be related to inaccurate sensor reading  She is doing very well with diet and exercise regimen She is still not understanding that her pancreatic function appears to be normal with insulin secretion keeping her sugars in the normal range and she is still afraid of potential hyperglycemia  Again BMI is low normal and she is afraid to increase her calorie intake or portions  Recommendations:  Try to leave off metformin completely and watch blood sugars Only if blood sugars are consistently over 125 fasting or 140 after meals.  Need to consider pharmacological treatment However discussed that it is very unlikely that she will have recurrence of her diabetes unless she has pancreatitis Recommended that she would be a little more liberal with her calorie intake as long as she has balanced meals All questions regarding diabetes and management answered today Follow-up in 6 months   Patient Instructions  Stop Metformin  Put sensor on 2 weeks out of a month  Total visit time for evaluation and management and counseling = 30 minutes  Jayceion Lisenby 07/07/2023, 1:34 PM

## 2023-07-07 NOTE — Patient Instructions (Signed)
Stop Metformin  Put sensor on 2 weeks out of a month

## 2023-07-09 ENCOUNTER — Encounter: Payer: Self-pay | Admitting: Endocrinology

## 2023-07-25 ENCOUNTER — Telehealth: Payer: Self-pay | Admitting: Endocrinology

## 2023-07-25 ENCOUNTER — Other Ambulatory Visit: Payer: Self-pay

## 2023-07-25 DIAGNOSIS — E119 Type 2 diabetes mellitus without complications: Secondary | ICD-10-CM

## 2023-07-25 MED ORDER — FREESTYLE LIBRE 2 READER DEVI: 1 refills | Status: DC

## 2023-07-25 NOTE — Telephone Encounter (Signed)
Patient stating that the pharmacy is not responding to her request. Here FreeStyle Librae#2 is malfunction(reader) and she need a new reader ASAP. Please call patient.

## 2023-07-25 NOTE — Telephone Encounter (Signed)
Freestyle Libre 2 reader has been sent to [facility] and [name] is aware

## 2023-09-03 ENCOUNTER — Other Ambulatory Visit: Payer: Self-pay | Admitting: Family Medicine

## 2023-09-03 DIAGNOSIS — Z1231 Encounter for screening mammogram for malignant neoplasm of breast: Secondary | ICD-10-CM

## 2023-09-30 ENCOUNTER — Ambulatory Visit: Payer: PPO

## 2023-10-08 ENCOUNTER — Other Ambulatory Visit: Payer: Self-pay | Admitting: Endocrinology

## 2023-10-08 DIAGNOSIS — E119 Type 2 diabetes mellitus without complications: Secondary | ICD-10-CM

## 2023-10-24 ENCOUNTER — Ambulatory Visit
Admission: RE | Admit: 2023-10-24 | Discharge: 2023-10-24 | Disposition: A | Discharge status: Home / Self Care | Payer: PPO | Source: Ambulatory Visit | Attending: Family Medicine | Admitting: Family Medicine

## 2023-10-24 DIAGNOSIS — Z1231 Encounter for screening mammogram for malignant neoplasm of breast: Secondary | ICD-10-CM

## 2023-12-29 ENCOUNTER — Other Ambulatory Visit: Payer: Self-pay

## 2023-12-29 ENCOUNTER — Telehealth: Payer: Self-pay

## 2023-12-29 DIAGNOSIS — E089 Diabetes mellitus due to underlying condition without complications: Secondary | ICD-10-CM

## 2023-12-29 NOTE — Telephone Encounter (Signed)
I placed order for BMP with EGFR and hemoglobin A1c.

## 2023-12-29 NOTE — Telephone Encounter (Signed)
Which orders would you like for patient?

## 2024-01-02 ENCOUNTER — Other Ambulatory Visit: Payer: PPO

## 2024-01-03 LAB — BASIC METABOLIC PANEL WITH GFR
BUN: 22 mg/dL (ref 7–25)
CO2: 29 mmol/L (ref 20–32)
Calcium: 10 mg/dL (ref 8.6–10.4)
Chloride: 102 mmol/L (ref 98–110)
Creat: 0.8 mg/dL (ref 0.50–1.05)
Glucose, Bld: 84 mg/dL (ref 65–99)
Potassium: 4.9 mmol/L (ref 3.5–5.3)
Sodium: 141 mmol/L (ref 135–146)
eGFR: 80 mL/min/{1.73_m2} (ref >=60)

## 2024-01-03 LAB — HEMOGLOBIN A1C
Hgb A1c MFr Bld: 5.4 %{Hb} (ref <5.7)
Mean Plasma Glucose: 108 mg/dL
eAG (mmol/L): 6 mmol/L

## 2024-01-05 ENCOUNTER — Encounter: Payer: Self-pay | Admitting: Endocrinology

## 2024-01-07 ENCOUNTER — Encounter: Payer: Self-pay | Admitting: Endocrinology

## 2024-01-07 ENCOUNTER — Ambulatory Visit: Payer: PPO | Admitting: Endocrinology

## 2024-01-07 VITALS — BP 108/62 | HR 62 | Resp 16 | Ht 68.0 in

## 2024-01-07 DIAGNOSIS — Z794 Long term (current) use of insulin: Secondary | ICD-10-CM | POA: Diagnosis not present

## 2024-01-07 DIAGNOSIS — E119 Type 2 diabetes mellitus without complications: Secondary | ICD-10-CM

## 2024-01-07 MED ORDER — FREESTYLE LIBRE 2 SENSOR MISC: 1.0000 | 3 refills | Status: DC

## 2024-01-07 NOTE — Progress Notes (Signed)
 Outpatient Endocrinology Note [place] Denim Start, MD   Patient's Name: [name]    DOB: 06-20-1956    MRN: [id number]                                                    REASON OF VISIT: Follow up for type 2 diabetes mellitus  PCP: Tracey Harries, MD  HISTORY OF PRESENT ILLNESS:   [name] is a 68 y.o. old female with past medical history listed below, is here for follow up for type 2 diabetes mellitus.   Pertinent Diabetes History: Patient was previously seen by Dr. Lucianne Muss and Dr. Everardo All, was last time seen in August 2024.  Patient was diagnosed with type 2 diabetes mellitus in 03/2021.  At the time of initial diagnosis in May 2022 she was hospitalized for hyperosmolar hyperglycemic state with blood sugar of 1109, hyperglycemia at that time was felt to be likely from acute pancreatitis of unclear etiology.  Hemoglobin A1c at the time of diagnosis was 11.3%.  She was discharged on basal insulin Lantus 35 units at that time which was subsequently stopped.  She had rapid improvement on blood sugar requiring small dose of basal insulin.  Hemoglobin A1c was 6.1% in July 2022 2 months after the diagnosis of type 2 diabetes mellitus.  After that from December 2022, hemoglobin A1c has always remained in the normal range in 5% range 5.1 to 5.5%.  History of acute pancreatitis in May 2022.  She has no chronic pancreatitis.  No recurrent pancreatitis.  Patient was mostly taking metformin 500 mg 2 times a day, which was changed to 1 times daily and ultimately stopped in August 2024.  Chronic Diabetes Complications : Retinopathy: no. Last ophthalmology exam was done on annually, 03/2023, following with ophthalmology regularly.  Nephropathy: no Peripheral neuropathy: no Coronary artery disease: no Stroke: no  Relevant comorbidities and cardiovascular risk factors: Obesity: no Body mass index is 20.16 kg/m.  Hypertension: no Hyperlipidemia : Yes, not on statin   Current / Home Diabetic  regimen includes:  None  Lifestyle modification with diet and exercise, following with nutritionist from the medical insurance monthly.  She lost significant amount of weight intentionally.  Prior diabetic medications: Used to be on basal insulin Lantus from the time of diagnosis and was gradually tapered and stopped in July 2023. Used to be on metformin 500 mg 2 times a day, decreased to 500 mg 1 tablet daily and stopped in August 2024.  Glycemic data:    CONTINUOUS GLUCOSE MONITORING SYSTEM (CGMS) INTERPRETATION: At today's visit, we reviewed CGM downloads. The full report is scanned in the media. Reviewing the CGM trends, blood glucose are as follows:  FreeStyle Libre 2 CGM-  Sensor Download (Sensor download was reviewed and summarized below.) Dates: February 13 to January 07, 2024, 14 days Sensor Average: 95  Glucose Management Indicator: 5.6% Glucose Variability: 10.5%  % data captured: 90%    Interpretation: -All the blood sugars are in acceptable range, no hyperglycemia and no hypoglycemia.  Average blood sugar 95.  GMI 5.6%.  Hypoglycemia: Patient has no hypoglycemic episodes. Patient has hypoglycemia awareness.  Factors modifying glucose control: 1.  Diabetic diet assessment: Significant improvement on diet, watching calories and eating healthy.  2.  Staying active or exercising: Aerobic exercise, dancing, gym.  3.  Medication compliance: compliant  n/a of the time.  Interval history  Freestyle libre 2 CGM data as reviewed above.  All the blood sugar are in acceptable range.  GMI 5.6%, recent hemoglobin A1c 5.4%.  She has not been taking metformin as suggested during last visit in August 2024.  Her blood sugar has remained off of metformin.  She has no numbness and ting of the feet.  No vision problem.  No other complaints today.  Patient reports that during the holiday time around Christmas time when she ate pineapple she had blood sugar up to 183, she has noticed  she tends to have higher blood sugar when eating high carb or sugary meals.  REVIEW OF SYSTEMS As per history of present illness.   PAST MEDICAL HISTORY: Past Medical History:  Diagnosis Date   Anxiety    Bipolar disorder (HCC)    Chest pain    Myocardial Perfusion Study 02/08/02 - Abnormal study suggesting ischemia in anterior to anteroseptal wall to mid to upper mid ventricular region w/associated hypocontractibility. Clinical correlation is recommended. EF=46% (Dr. 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 Small amount of pericardial fluid in the pericardial recess adjacent to the ascending aorta   Rectocele, female    SUI (stress urinary incontinence, female)    Tachycardia     PAST SURGICAL HISTORY: Past Surgical History:  Procedure Laterality Date   ABDOMINAL HYSTERECTOMY  1982   TAH   ANKLE SURGERY Right 1998   BACK SURGERY     CARDIAC CATHETERIZATION  03/15/02   No evidence of CAD   CHOLECYSTECTOMY  1998   KNEE SURGERY Right 1985   LAMINECTOMY     OOPHORECTOMY Left 1998   LSO - Benign tumor   POSTERIOR REPAIR  1985    ALLERGIES: Allergies  Allergen Reactions   Codeine Nausea Only   Diphenhydramine Hcl Other (See Comments)    Makes heart race   Nasal Decongestant [Pseudoephedrine] Other (See Comments)    Nervousness, insomnia    FAMILY HISTORY:  Family History  Problem Relation Age of Onset   Diabetes Paternal Grandmother 41   Cancer Paternal Grandmother 66   Heart disease Paternal Grandfather    Heart attack Paternal Grandfather 33   Cancer Maternal Grandmother 61   Heart attack Maternal Grandfather 58   Heart defect Child    Diabetes Father    Heart  disease Father    Heart attack Father    Lymphoma Maternal Uncle    Rheumatic fever Maternal Uncle    Heart attack Cousin 33    SOCIAL HISTORY: Social History   Socioeconomic History   Marital status: Married    Spouse name: Not on file   Number of children: Not on file   Years of education: Not on file   Highest education level: Not on file  Occupational History   Not on file  Tobacco Use   Smoking status: Former    Current packs/day: 1.00    Average packs/day: 1 pack/day for 23.0 years (23.0 ttl pk-yrs)    Types: Cigarettes   Smokeless tobacco: Never  Vaping Use   Vaping status: Every Day  Substances: Nicotine  Substance and Sexual Activity   Alcohol use: Yes    Alcohol/week: 0.0 standard drinks of alcohol    Comment: occ   Drug use: No   Sexual activity: Yes    Birth control/protection: Post-menopausal    Comment: 1st intercourse 68 yo-More than 5 partners  Other Topics Concern   Not on file  Social History Narrative   Not on file   Social Drivers of Health   Financial Resource Strain: Low Risk  (12/09/2023)   Received from [facility]   Overall Financial Resource Strain (CARDIA)    Difficulty of Paying Living Expenses: Not hard at all  Food Insecurity: No Food Insecurity (12/09/2023)   [facility]   Hunger Vital Sign    Worried About Running Out of Food in the Last Year: Never true    Ran Out of Food in the Last Year: Never true  Transportation Needs: No Transportation Needs (12/09/2023)   Received from [facility] - Transportation    Lack of Transportation (Medical): No    Lack of Transportation (Non-Medical): No  Physical Activity: Inactive (12/09/2023)   Received from [facility]   Exercise Vital Sign    Days of Exercise per Week: 0 days    Minutes of Exercise per Session: 30 min  Stress: No Stress Concern Present (12/09/2023)   Received from [facility] - Occupational Stress  Questionnaire    Feeling of Stress : Only a little  Social Connections: Socially Integrated (12/09/2023)   Received from [facility]   Social Network    How would you rate your social network (family, work, friends)?: Good participation with social networks    MEDICATIONS:  Current Outpatient Medications  Medication Sig Dispense Refill   alprazolam (XANAX) 2 MG tablet Take 2 mg by mouth See admin instructions. Take 1 1/2 tablets (3 mg) by mouth daily at bedtime, may also take 1/2 tablet (1 mg) during the day as needed for anxiety     aspirin EC 81 MG tablet Take 81 mg by mouth See admin instructions. Take one tablet (81 mg) by mouth every other night.  Swallow whole.     blood glucose meter kit and supplies KIT Dispense based on patient and insurance preference. Use up to four times daily as directed. 1 each 0   clobetasol ointment (TEMOVATE) 0.05 % Apply 1 application topically 2 (two) times daily as needed (itching/psoriasis).     Continuous Glucose Receiver (FREESTYLE LIBRE 2 READER) DEVI USE AS DIRECTED 1 each 1   HYDROcodone-acetaminophen (NORCO) 5-325 MG per tablet Take 1 tablet by mouth 2 (two) times daily as needed (pain).     hydrocortisone (ANUSOL-HC) 2.5 % rectal cream Place 1 application. rectally 2 (two) times daily. 30 g 0   hydrocortisone 2.5 % cream Apply 1 application topically 2 (two) times daily. Apply to face     nystatin (MYCOSTATIN) 100000 UNIT/ML suspension Take 5 mLs by mouth 4 (four) times daily. Swish and spit     polyethylene glycol (MIRALAX / GLYCOLAX) 17 g packet Take 17 g by mouth 2 (two) times daily. Mix in 8 oz liquid and drink     polyvinyl alcohol (LIQUIFILM TEARS) 1.4 % ophthalmic solution Place 1 drop into the left eye 2 (two) times daily as needed for dry eyes.     triamcinolone ointment (KENALOG) 0.1 % Apply 1 application topically 2 (two) times daily as needed (itching/psoriasis).  Continuous Glucose Sensor (FREESTYLE LIBRE 2 SENSOR) MISC 1 Device by  Does not apply route every 14 (fourteen) days. 6 each 3   Vitamin D, Ergocalciferol, (DRISDOL) 1.25 MG (50000 UNIT) CAPS capsule Take 50,000 Units by mouth every Wednesday. (Patient not taking: Reported on 01/07/2024)     No current facility-administered medications for this visit.    PHYSICAL EXAM: Vitals:   01/07/24 1456  BP: 108/62  Pulse: 62  Resp: 16  SpO2: 99%  Height: 5\' 8"  (1.727 m)   Body mass index is 20.16 kg/m.  Wt Readings from Last 3 Encounters:  07/07/23 132 lb 9.6 oz (60.1 kg)  01/27/23 131 lb (59.4 kg)  01/06/23 133 lb 12.8 oz (60.7 kg)    General: Well developed, well nourished female in no apparent distress.  HEENT: AT/[place], no external lesions.  Eyes: Conjunctiva clear and no icterus. Neck: Neck supple  Lungs: Respirations not labored Neurologic: Alert, oriented, normal speech Extremities / Skin: Dry. No sores or rashes noted.  Psychiatric: Does not appear depressed or anxious  Diabetic Foot Exam - Simple   Simple Foot Form Diabetic Foot exam was performed with the following findings: Yes 01/07/2024  3:15 PM  Visual Inspection No deformities, no ulcerations, no other skin breakdown bilaterally: Yes Sensation Testing Intact to touch and monofilament testing bilaterally: Yes Pulse Check Posterior Tibialis and Dorsalis pulse intact bilaterally: Yes Comments     LABS Reviewed Lab Results  Component Value Date   HGBA1C 5.4 01/02/2024   HGBA1C 5.5 07/03/2023   HGBA1C 5.4 01/02/2023   No results found for: "FRUCTOSAMINE" Lab Results  Component Value Date   CHOL 192 01/02/2023   HDL 81.20 01/02/2023   LDLCALC 94 01/02/2023   TRIG 81.0 01/02/2023   CHOLHDL 2 01/02/2023   Lab Results  Component Value Date   MICRALBCREAT 1.7 07/03/2023   Lab Results  Component Value Date   CREATININE 0.80 01/02/2024   Lab Results  Component Value Date   GFR 73.63 07/03/2023    ASSESSMENT / PLAN  1. Type 2 diabetes mellitus without complication, with  long-term current use of insulin (HCC)   2. Type 2 diabetes mellitus without complication, without long-term current use of insulin (HCC)     Diabetes Mellitus type 2, complicated by no known complications.  Type 2 diabetes is in remission. - Diabetic status / severity: Controlled on lifestyle modification.  Lab Results  Component Value Date   HGBA1C 5.4 01/02/2024    - Hemoglobin A1c goal : <6.5%  Patient was diagnosed with type 2 diabetes mellitus with hemoglobin A1c of 11.3% in May 2022, at that time she was hospitalized with HHS, type 2 diabetes mellitus was thought to be related to having as acute pancreatitis at that time.  Patient has rapid improvement on blood sugar with hemoglobin A1c has remained mostly in the normal range 5.1 to 5.5% since December 2022.  She has no recurrent pancreatitis or chronic pancreatitis.  Patient has significant improvement on diet and exercise, lost significant amount of weight intentionally.  Basal insulin therapy was stopped in July 2023 and metformin was stopped in August 2024.  She has remained controlled diabetes with almost all blood sugar in the normal range, without any diabetic medication.  She has been regularly following with nutritionist and has maintained lifestyle modification with diet and exercise.  - Medications: No change.  I) no plan for antidiabetic medication.  Will monitor without it. II) continue lifestyle modification with diet and exercise.  -  Home glucose testing: She preferred to continue on CGM, okay to continue freestyle libre 2 monitor and check blood sugar as needed. - Discussed/ Gave Hypoglycemia treatment plan.  # Consult : not required at this time.   # Annual urine for microalbuminuria/ creatinine ratio, no microalbuminuria currently. Last  Lab Results  Component Value Date   MICRALBCREAT 1.7 07/03/2023    # Foot check nightly.  # Annual dilated diabetic eye exams.    2. Blood pressure  -  BP  Readings from Last 1 Encounters:  01/07/24 108/62    - Control is in target.  - No change in current plans.  3. Lipid status / Hyperlipidemia - Last  Lab Results  Component Value Date   LDLCALC 94 01/02/2023   -Currently not on a statin.  Patient prefers not to be on a statin.  Managed by primary care provider.  Diagnoses and all orders for this visit:  Type 2 diabetes mellitus without complication, with long-term current use of insulin (HCC) -     Hemoglobin A1c -     BASIC METABOLIC PANEL WITH GFR -     Microalbumin / creatinine urine ratio  Type 2 diabetes mellitus without complication, without long-term current use of insulin (HCC) -     Continuous Glucose Sensor (FREESTYLE LIBRE 2 SENSOR) MISC; 1 Device by Does not apply route every 14 (fourteen) days.    DISPOSITION Follow up in clinic in 6  months suggested.  Labs prior to follow-up visit.   All questions answered and patient verbalized understanding of the plan.  [place] Ilijah Doucet, MD [facility] Endocrinology [facility] [address] [place], [address] [place], [place] [address] Phone # [phone]  At least part of this note was generated using voice recognition software. Inadvertent word errors may have occurred, which were not recognized during the proofreading process.

## 2024-05-18 ENCOUNTER — Other Ambulatory Visit: Payer: Self-pay

## 2024-05-18 DIAGNOSIS — E119 Type 2 diabetes mellitus without complications: Secondary | ICD-10-CM

## 2024-05-18 MED ORDER — FREESTYLE LIBRE 2 SENSOR MISC: 1.0000 | 3 refills | Status: DC

## 2024-06-17 ENCOUNTER — Encounter: Admitting: Obstetrics and Gynecology

## 2024-07-02 ENCOUNTER — Other Ambulatory Visit: Payer: PPO

## 2024-07-03 LAB — HEMOGLOBIN A1C
Hgb A1c MFr Bld: 5.4 % (ref <5.7)
Mean Plasma Glucose: 108 mg/dL
eAG (mmol/L): 6 mmol/L

## 2024-07-03 LAB — MICROALBUMIN / CREATININE URINE RATIO
Creatinine, Urine: 109 mg/dL (ref 20–275)
Microalb Creat Ratio: 14 mg/g{creat} (ref <30)
Microalb, Ur: 1.5 mg/dL

## 2024-07-03 LAB — BASIC METABOLIC PANEL WITH GFR
BUN: 20 mg/dL (ref 7–25)
CO2: 28 mmol/L (ref 20–32)
Calcium: 10.2 mg/dL (ref 8.6–10.4)
Chloride: 101 mmol/L (ref 98–110)
Creat: 0.77 mg/dL (ref 0.50–1.05)
Glucose, Bld: 106 mg/dL — ABNORMAL HIGH (ref 65–99)
Potassium: 5 mmol/L (ref 3.5–5.3)
Sodium: 139 mmol/L (ref 135–146)
eGFR: 83 mL/min/1.73m2 (ref >=60)

## 2024-07-05 ENCOUNTER — Ambulatory Visit: Payer: Self-pay | Admitting: Endocrinology

## 2024-07-07 ENCOUNTER — Ambulatory Visit: Payer: PPO | Admitting: Endocrinology

## 2024-07-29 ENCOUNTER — Encounter: Payer: Self-pay | Admitting: Obstetrics and Gynecology

## 2024-07-29 ENCOUNTER — Ambulatory Visit (INDEPENDENT_AMBULATORY_CARE_PROVIDER_SITE_OTHER): Admitting: Obstetrics and Gynecology

## 2024-07-29 VITALS — BP 122/60 | HR 83 | Ht 68.0 in | Wt 129.0 lb

## 2024-07-29 DIAGNOSIS — E2839 Other primary ovarian failure: Secondary | ICD-10-CM | POA: Diagnosis not present

## 2024-07-29 DIAGNOSIS — Z1231 Encounter for screening mammogram for malignant neoplasm of breast: Secondary | ICD-10-CM

## 2024-07-29 DIAGNOSIS — N952 Postmenopausal atrophic vaginitis: Secondary | ICD-10-CM | POA: Diagnosis not present

## 2024-07-29 DIAGNOSIS — Z1331 Encounter for screening for depression: Secondary | ICD-10-CM | POA: Diagnosis not present

## 2024-07-29 DIAGNOSIS — Z1211 Encounter for screening for malignant neoplasm of colon: Secondary | ICD-10-CM

## 2024-07-29 DIAGNOSIS — Z9189 Other specified personal risk factors, not elsewhere classified: Secondary | ICD-10-CM | POA: Diagnosis not present

## 2024-07-29 DIAGNOSIS — B009 Herpesviral infection, unspecified: Secondary | ICD-10-CM

## 2024-07-29 DIAGNOSIS — Z01419 Encounter for gynecological examination (general) (routine) without abnormal findings: Secondary | ICD-10-CM

## 2024-07-29 MED ORDER — ESTRADIOL 0.1 MG/GM VA CREA: TOPICAL_CREAM | VAGINAL | 12 refills | Status: AC

## 2024-07-29 NOTE — Progress Notes (Addendum)
 68 y.o. y.o. female here for reestablish medicare gyn annual exam. Has 4 daughters and 6 grandkids One daughter is a Recruitment consultant at [employer]  No LMP recorded. Patient has had a hysterectomy.      [id number] for breast and pelvic exam.  Former patient of Dr. Winfred.  Has not been seen for 3+ years.    No GI symptoms such as constipation or diarrhea.  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  Had laparoscopic lysis of adhesions in 2009.  Dxa 2015 normal. Repeat ordered Last mammogram: 10/24/23 birads 1 neg, breast center. Referral placed Last colonoscopy: 2021 repeat in 5 years  There is no height or weight on file to calculate BMI.    04/19/2021    7:34 PM  Depression screen PHQ 2/9  Decreased Interest 0  Down, Depressed, Hopeless 0  PHQ - 2 Score 0     There were no vitals taken for this visit.  No results found for: DIAGPAP, HPVHIGH, ADEQPAP  GYN HISTORY: No results found for: DIAGPAP, HPVHIGH, ADEQPAP  OB History  Gravida Para Term Preterm AB Living  9 5   4 4   SAB IAB Ectopic Multiple Live Births  4        # Outcome Date GA Lbr Len/2nd Weight Sex Type Anes PTL Lv  9 SAB           8 SAB           7 SAB           6 SAB           5 Para           4 Para           3 Para           2 Para           1 Para             Past Medical History:  Diagnosis Date   Anxiety    Bipolar disorder (HCC)    Chest pain    Myocardial Perfusion Study 02/08/02 - Abnormal study suggesting ischemia in anterior to anteroseptal wall to mid to upper mid ventricular region w/associated hypocontractibility. Clinical correlation is recommended. EF=46% (Dr. IVAR Sor)   Cystic mesothelioma    LARGE MESOTHELIAL CYST   Decreased pulse    Lower arterial duplex scan - 02/18/08 - NORMAL   Depression    Diabetes mellitus without complication (HCC)    HSV infection 07/13/1979   Mental disorder    Ovarian neoplasm    LARGE OVARIAN NEOPLASM    Palpitations    Cardiac CTA 03/31/08 - mild cardiomegaly. Small amount of pericardial fluid in the pericardial recess adjacent to the ascending aorta   Rectocele, female    SUI (stress urinary incontinence, female)    Tachycardia     Past Surgical History:  Procedure Laterality Date   ABDOMINAL HYSTERECTOMY  1982   TAH   ANKLE SURGERY Right 1998   BACK SURGERY     CARDIAC CATHETERIZATION  03/15/02   No evidence of CAD   CHOLECYSTECTOMY  1998   KNEE SURGERY Right 1985   LAMINECTOMY     OOPHORECTOMY Left 1998   LSO - Benign tumor   POSTERIOR REPAIR  1985    Current Outpatient Medications on File Prior to Visit  Medication Sig Dispense Refill   alprazolam  (XANAX ) 2 MG tablet Take 2  mg by mouth See admin instructions. Take 1 1/2 tablets (3 mg) by mouth daily at bedtime, may also take 1/2 tablet (1 mg) during the day as needed for anxiety     aspirin  EC 81 MG tablet Take 81 mg by mouth See admin instructions. Take one tablet (81 mg) by mouth every other night.  Swallow whole.     blood glucose meter kit and supplies KIT Dispense based on patient and insurance preference. Use up to four times daily as directed. 1 each 0   clobetasol  ointment (TEMOVATE ) 0.05 % Apply 1 application topically 2 (two) times daily as needed (itching/psoriasis).     Continuous Glucose Receiver (FREESTYLE LIBRE 2 READER) DEVI USE AS DIRECTED 1 each 1   Continuous Glucose Sensor (FREESTYLE LIBRE 2 SENSOR) MISC 1 Device by Does not apply route every 14 (fourteen) days. 6 each 3   HYDROcodone -acetaminophen  (NORCO) 5-325 MG per tablet Take 1 tablet by mouth 2 (two) times daily as needed (pain).     hydrocortisone  2.5 % cream Apply 1 application topically 2 (two) times daily. Apply to face     polyethylene glycol (MIRALAX / GLYCOLAX) 17 g packet Take 17 g by mouth 2 (two) times daily. Mix in 8 oz liquid and drink     polyvinyl alcohol  (LIQUIFILM TEARS) 1.4 % ophthalmic solution Place 1 drop into the left eye 2 (two)  times daily as needed for dry eyes.     triamcinolone  ointment (KENALOG ) 0.1 % Apply 1 application topically 2 (two) times daily as needed (itching/psoriasis).     desvenlafaxine (PRISTIQ) 25 MG 24 hr tablet Take 25 mg by mouth daily.     hydrocortisone  (ANUSOL -HC) 2.5 % rectal cream Place 1 application. rectally 2 (two) times daily. (Patient not taking: Reported on 07/29/2024) 30 g 0   nystatin (MYCOSTATIN) 100000 UNIT/ML suspension Take 5 mLs by mouth 4 (four) times daily. Swish and spit (Patient not taking: Reported on 07/29/2024)     Vitamin D, Ergocalciferol, (DRISDOL) 1.25 MG (50000 UNIT) CAPS capsule Take 50,000 Units by mouth every Wednesday. (Patient not taking: Reported on 07/29/2024)     No current facility-administered medications on file prior to visit.    Social History   Socioeconomic History   Marital status: Married    Spouse name: Not on file   Number of children: Not on file   Years of education: Not on file   Highest education level: Not on file  Occupational History   Not on file  Tobacco Use   Smoking status: Former    Current packs/day: 1.00    Average packs/day: 1 pack/day for 23.0 years (23.0 ttl pk-yrs)    Types: Cigarettes   Smokeless tobacco: Never  Vaping Use   Vaping status: Every Day   Substances: Nicotine  Substance and Sexual Activity   Alcohol  use: Not Currently   Drug use: No   Sexual activity: Yes    Birth control/protection: Post-menopausal    Comment: 1st intercourse 68 yo-More than 5 partners  Other Topics Concern   Not on file  Social History Narrative   Not on file   Social Drivers of Health   Financial Resource Strain: Low Risk  (03/06/2024)   Received from [facility]   Overall Financial Resource Strain (CARDIA)    Difficulty of Paying Living Expenses: Not hard at all  Food Insecurity: No Food Insecurity (03/06/2024)   Received from [facility]   Hunger Vital Sign    Within the past 12  months, you worried that your food would  run out before you got the money to buy more.: Never true    Within the past 12 months, the food you bought just didn't last and you didn't have money to get more.: Never true  Transportation Needs: No Transportation Needs (03/06/2024)   Received from [facility] - Transportation    Lack of Transportation (Medical): No    Lack of Transportation (Non-Medical): No  Physical Activity: Unknown (03/06/2024)   Received from [facility]   Exercise Vital Sign    On average, how many days per week do you engage in moderate to strenuous exercise (like a brisk walk)?: Patient declined    On average, how many minutes do you engage in exercise at this level?: 0 min  Recent Concern: Physical Activity - Inactive (12/09/2023)   Received from [facility]   Exercise Vital Sign    Days of Exercise per Week: 0 days    Minutes of Exercise per Session: 30 min  Stress: No Stress Concern Present (03/06/2024)   Received from [facility] - Occupational Stress Questionnaire    Feeling of Stress : Only a little  Social Connections: Socially Integrated (03/06/2024)   Received from [facility]   Social Network    How would you rate your social network (family, work, friends)?: Good participation with social networks  Intimate Partner Violence: Not At Risk (03/06/2024)   Received from [facility]   HITS    Over the last 12 months how often did your partner physically hurt you?: Never    Over the last 12 months how often did your partner insult you or talk down to you?: Rarely    Over the last 12 months how often did your partner threaten you with physical harm?: Never    Over the last 12 months how often did your partner scream or curse at you?: Rarely    Family History  Problem Relation Age of Onset   Diabetes Paternal Grandmother 14   Cancer Paternal Grandmother 34   Heart disease Paternal Grandfather    Heart attack Paternal Grandfather 75    Cancer Maternal Grandmother 61   Heart attack Maternal Grandfather 58   Heart defect Child    Diabetes Father    Heart disease Father    Heart attack Father    Lymphoma Maternal Uncle    Rheumatic fever Maternal Uncle    Heart attack Cousin 10     Allergies  Allergen Reactions   Codeine Nausea Only   Diphenhydramine Hcl Other (See Comments)    Makes heart race   Nasal Decongestant [Pseudoephedrine] Other (See Comments)    Nervousness, insomnia      Patient's last menstrual period was No LMP recorded. Patient has had a hysterectomy..            Review of Systems Alls systems reviewed and are negative.     Physical Exam Constitutional:      Appearance: Normal appearance.  Genitourinary:     Vulva normal.     No lesions in the vagina.     Right Labia: No rash, lesions or skin changes.    Left Labia: No lesions, skin changes or rash.    Vaginal cuff intact.    No vaginal discharge or tenderness.     No vaginal prolapse present.    Moderate vaginal atrophy present.     Right Adnexa: not absent.  Left Adnexa: absent.    Cervix is absent.     Uterus is absent.  Breasts:    Right: Normal.     Left: Normal.  HENT:     Head: Normocephalic.  Neck:     Thyroid : No thyroid  mass, thyromegaly or thyroid  tenderness.  Cardiovascular:     Rate and Rhythm: Normal rate and regular rhythm.     Heart sounds: Normal heart sounds, S1 normal and S2 normal.  Pulmonary:     Effort: Pulmonary effort is normal.     Breath sounds: Normal breath sounds and air entry.  Abdominal:     General: Bowel sounds are normal. There is no distension.     Palpations: Abdomen is soft. There is no mass.     Tenderness: There is no abdominal tenderness. There is no guarding or rebound.  Musculoskeletal:     Cervical back: Full passive range of motion without pain, normal range of motion and neck supple. No tenderness.     Right lower leg: No edema.     Left lower leg: No edema.  Neurological:      Mental Status: She is alert.  Skin:    General: Skin is warm.  Psychiatric:        Mood and Affect: Mood normal.        Behavior: Behavior normal.        Thought Content: Thought content normal.  Vitals and nursing note reviewed. Exam conducted with a chaperone present.       A:         Well Woman GYN exam                             P:        Pap smear not indicated Encouraged annual mammogram screening Colon cancer screening referral placed today DXA ordered today Labs and immunizations to do with PMD Discussed breast self exams Encouraged healthy lifestyle practices Encouraged Vit D and Calcium   No follow-ups on file.  Almarie MARLA Carpen

## 2024-07-30 NOTE — Addendum Note (Signed)
 Addended by: GLENNON ALMARIE POUR on: 07/30/2024 04:08 PM   Modules accepted: Level of Service

## 2024-08-31 ENCOUNTER — Telehealth: Payer: Self-pay

## 2024-08-31 DIAGNOSIS — E119 Type 2 diabetes mellitus without complications: Secondary | ICD-10-CM

## 2024-08-31 MED ORDER — FREESTYLE LIBRE 2 PLUS SENSOR MISC: 1.0000 | 3 refills | Status: AC

## 2024-08-31 NOTE — Telephone Encounter (Signed)
 Refill request complete

## 2024-09-02 ENCOUNTER — Ambulatory Visit (INDEPENDENT_AMBULATORY_CARE_PROVIDER_SITE_OTHER): Admitting: Endocrinology

## 2024-09-02 ENCOUNTER — Encounter: Payer: Self-pay | Admitting: Endocrinology

## 2024-09-02 VITALS — BP 112/62 | HR 60 | Resp 20 | Ht 68.0 in | Wt 129.0 lb

## 2024-09-02 DIAGNOSIS — E119 Type 2 diabetes mellitus without complications: Secondary | ICD-10-CM | POA: Diagnosis not present

## 2024-09-02 MED ORDER — FREESTYLE LIBRE 2 PLUS SENSOR MISC: 1.0000 | Status: AC

## 2024-09-02 NOTE — Addendum Note (Signed)
 Addended by: ARELIA DIETRICH SAILOR on: 09/02/2024 03:02 PM   Modules accepted: Orders

## 2024-09-02 NOTE — Progress Notes (Signed)
 Outpatient Endocrinology Note Emma Caretha Rumbaugh, MD   Patient's Name: [name]    DOB: [birth year]    MRN: [id number]                                                    REASON OF VISIT: Follow up for type 2 diabetes mellitus  PCP: Pura Lenis, MD  HISTORY OF PRESENT ILLNESS:   [name] is a 68 y.o. old female with past medical history listed below, is here for follow up for type 2 diabetes mellitus.   Pertinent Diabetes History: Patient was previously seen by Dr. Von and Dr. Kassie, was last time seen in August 2024.  Patient was diagnosed with type 2 diabetes mellitus in 03/2021.  At the time of initial diagnosis in May 2022 she was hospitalized for hyperosmolar hyperglycemic state with blood sugar of 1109, hyperglycemia at that time was felt to be likely from acute pancreatitis of unclear etiology.  Hemoglobin A1c at the time of diagnosis was 11.3%.  She was discharged on basal insulin  Lantus  35 units at that time which was subsequently stopped.  She had rapid improvement on blood sugar requiring small dose of basal insulin .  Hemoglobin A1c was 6.1% in July 2022 2 months after the diagnosis of type 2 diabetes mellitus.  After that from December 2022, hemoglobin A1c has always remained in the normal range in 5% range 5.1 to 5.5%.  History of acute pancreatitis in May 2022.  She has no chronic pancreatitis.  No recurrent pancreatitis.  Patient was mostly taking metformin  500 mg 2 times a day, which was changed to 1 times daily and ultimately stopped in August 2024.  Chronic Diabetes Complications : Retinopathy: no. Last ophthalmology exam was done on annually, following with ophthalmology regularly.  Nephropathy: no Peripheral neuropathy: no Coronary artery disease: no Stroke: no  Relevant comorbidities and cardiovascular risk factors: Obesity: no Body mass index is 19.61 kg/m.  Hypertension: no Hyperlipidemia : Yes, not on statin   Current / Home Diabetic regimen  includes:  None  Lifestyle modification with diet and exercise, following with nutritionist from the medical insurance monthly.  She lost significant amount of weight intentionally.  Prior diabetic medications: Used to be on basal insulin  Lantus  from the time of diagnosis and was gradually tapered and stopped in July 2023. Used to be on metformin  500 mg 2 times a day, decreased to 500 mg 1 tablet daily and stopped in August 2024.  Glycemic data:    CONTINUOUS GLUCOSE MONITORING SYSTEM (CGMS) INTERPRETATION: At today's visit, we reviewed CGM downloads. The full report is scanned in the media. Reviewing the CGM trends, blood glucose are as follows:  FreeStyle Libre 2 CGM-  Sensor Download (Sensor download was reviewed and summarized below.) Dates: October 8 to August 31, 2024, 14 days Sensor Average: 92     Previous :    Interpretation: All the blood sugars are in acceptable range.  Overnight occasional serum blood sugar in the 69 range still considered as normal.  No concerning hypoglycemia, no concerning hyperglycemia.  Hypoglycemia: Patient has no hypoglycemic episodes. Patient has hypoglycemia awareness.  Factors modifying glucose control: 1.  Diabetic diet assessment: Significant improvement on diet, watching calories and eating healthy.  2.  Staying active or exercising: Aerobic exercise, dancing, gym.  3.  Medication compliance: compliant  n/a of the time.  Interval history  Freestyle libre 2 + CGM data as reviewed above.  Although blood sugar in the acceptable range.  Hemoglobin A1c recently in was 5.4%.  She has been maintaining lifestyle modification with diet and exercise not on diabetic medication.  She has been off of metformin  for more than a year.  She has occasional numbness of the feet especially right, no vision problem.  No other complaints today.  Lab results from August reviewed normal electrolytes, renal function, normal urine microalbumin creatinine  ratio.  REVIEW OF SYSTEMS As per history of present illness.   PAST MEDICAL HISTORY: Past Medical History:  Diagnosis Date   Anxiety    Bipolar disorder (HCC)    Chest pain    Myocardial Perfusion Study 02/08/02 - Abnormal study suggesting ischemia in anterior to anteroseptal wall to mid to upper mid ventricular region w/associated hypocontractibility. Clinical correlation is recommended. EF=46% (Dr. 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 Small amount of pericardial fluid in the pericardial recess adjacent to the ascending aorta   Rectocele, female    SUI (stress urinary incontinence, female)    Tachycardia     PAST SURGICAL HISTORY: Past Surgical History:  Procedure Laterality Date   ABDOMINAL HYSTERECTOMY  1982   TAH   ANKLE SURGERY Right 1998   BACK SURGERY     CARDIAC CATHETERIZATION  03/15/02   No evidence of CAD   CHOLECYSTECTOMY  1998   KNEE SURGERY Right 1985   LAMINECTOMY     OOPHORECTOMY Left 1998   LSO - Benign tumor   POSTERIOR REPAIR  1985    ALLERGIES: Allergies  Allergen Reactions   Codeine Nausea Only   Diphenhydramine Hcl Other (See Comments)    Makes heart race   Nasal Decongestant [Pseudoephedrine] Other (See Comments)    Nervousness, insomnia    FAMILY HISTORY:  Family History  Problem Relation Age of Onset   Diabetes Paternal Grandmother 6   Cancer Paternal Grandmother 25   Heart disease Paternal Grandfather    Heart attack Paternal Grandfather 49   Cancer Maternal Grandmother 61   Heart attack Maternal Grandfather 58   Heart defect Child    Diabetes Father    Heart disease Father    Heart attack Father    Lymphoma Maternal Uncle     Rheumatic fever Maternal Uncle    Heart attack Cousin 9    SOCIAL HISTORY: Social History   Socioeconomic History   Marital status: Married    Spouse name: Not on file   Number of children: Not on file   Years of education: Not on file   Highest education level: Not on file  Occupational History   Not on file  Tobacco Use   Smoking status: Former    Current packs/day: 1.00    Average packs/day: 1 pack/day for 23.0 years (23.0 ttl pk-yrs)    Types: Cigarettes   Smokeless tobacco: Never  Vaping Use   Vaping status: Every Day   Substances: Nicotine  Substance and Sexual Activity   Alcohol  use: Not Currently   Drug use: No  Sexual activity: Yes    Birth control/protection: Post-menopausal    Comment: 1st intercourse 68 yo-More than 5 partners  Other Topics Concern   Not on file  Social History Narrative   Not on file   Social Drivers of Health   Financial Resource Strain: Low Risk  (03/06/2024)   Received from [facility]   Overall Financial Resource Strain (CARDIA)    Difficulty of Paying Living Expenses: Not hard at all  Food Insecurity: No Food Insecurity (03/06/2024)   [facility]   Hunger Vital Sign    Within the past 12 months, you worried that your food would run out before you got the money to buy more.: Never true    Within the past 12 months, the food you bought just didn't last and you didn't have money to get more.: Never true  Transportation Needs: No Transportation Needs (03/06/2024)   Received from [facility] - Transportation    Lack of Transportation (Medical): No    Lack of Transportation (Non-Medical): No  Physical Activity: Unknown (03/06/2024)   Received from [facility]   Exercise Vital Sign    On average, how many days per week do you engage in moderate to strenuous exercise (like a brisk walk)?: Patient declined    On average, how many minutes do you engage in exercise at this level?: 0 min  Recent Concern:  Physical Activity - Inactive (12/09/2023)   [facility] from [facility]   Exercise Vital Sign    Days of Exercise per Week: 0 days    Minutes of Exercise per Session: 30 min  Stress: No Stress Concern Present (03/06/2024)   Received from [facility] - Occupational Stress Questionnaire    Feeling of Stress : Only a little  Social Connections: Socially Integrated (03/06/2024)   Received from [facility]   Social Network    How would you rate your social network (family, work, friends)?: Good participation with social networks    MEDICATIONS:  Current Outpatient Medications  Medication Sig Dispense Refill   alprazolam  (XANAX ) 2 MG tablet Take 2 mg by mouth See admin instructions. Take 1 1/2 tablets (3 mg) by mouth daily at bedtime, may also take 1/2 tablet (1 mg) during the day as needed for anxiety     aspirin  EC 81 MG tablet Take 81 mg by mouth See admin instructions. Take one tablet (81 mg) by mouth every other night.  Swallow whole.     blood glucose meter kit and supplies KIT Dispense based on patient and insurance preference. Use up to four times daily as directed. 1 each 0   clobetasol  ointment (TEMOVATE ) 0.05 % Apply 1 application topically 2 (two) times daily as needed (itching/psoriasis).     Continuous Glucose Receiver (FREESTYLE LIBRE 2 READER) DEVI USE AS DIRECTED 1 each 1   Continuous Glucose Sensor (FREESTYLE LIBRE 2 PLUS SENSOR) MISC Inject 1 each into the skin as directed. Change sensor every 15 days 6 each 3   desvenlafaxine (PRISTIQ) 25 MG 24 hr tablet Take 25 mg by mouth daily.     estradiol  (ESTRACE  VAGINAL) 0.1 MG/GM vaginal cream Rub pea size amount each night for 3 weeks then 3 times a week thereafter. 42.5 g 12   HYDROcodone -acetaminophen  (NORCO) 5-325 MG per tablet Take 1 tablet by mouth 2 (two) times daily as needed (pain).     hydrocortisone  (ANUSOL -HC) 2.5 % rectal cream Place 1 application. rectally  2 (two) times daily.  30 g 0   hydrocortisone  2.5 % cream Apply 1 application topically 2 (two) times daily. Apply to face     nystatin (MYCOSTATIN) 100000 UNIT/ML suspension Take 5 mLs by mouth 4 (four) times daily. Swish and spit     polyethylene glycol (MIRALAX / GLYCOLAX) 17 g packet Take 17 g by mouth 2 (two) times daily. Mix in 8 oz liquid and drink     polyvinyl alcohol  (LIQUIFILM TEARS) 1.4 % ophthalmic solution Place 1 drop into the left eye 2 (two) times daily as needed for dry eyes.     triamcinolone  ointment (KENALOG ) 0.1 % Apply 1 application topically 2 (two) times daily as needed (itching/psoriasis).     Vitamin D, Ergocalciferol, (DRISDOL) 1.25 MG (50000 UNIT) CAPS capsule Take 50,000 Units by mouth every Wednesday.     No current facility-administered medications for this visit.    PHYSICAL EXAM: Vitals:   09/02/24 1345  BP: 112/62  Pulse: 60  Resp: 20  SpO2: 99%  Weight: 129 lb (58.5 kg)  Height: 5' 8 (1.727 m)    Body mass index is 19.61 kg/m.  Wt Readings from Last 3 Encounters:  09/02/24 129 lb (58.5 kg)  07/29/24 129 lb (58.5 kg)  07/07/23 132 lb 9.6 oz (60.1 kg)    General: Well developed, well nourished female in no apparent distress.  HEENT: AT/[place], no external lesions.  Eyes: Conjunctiva clear and no icterus. Neck: Neck supple  Lungs: Respirations not labored Neurologic: Alert, oriented, normal speech Extremities / Skin: Dry.  Psychiatric: Does not appear depressed or anxious  Diabetic Foot Exam - Simple   Simple Foot Form Diabetic Foot exam was performed with the following findings: Yes 09/02/2024  2:01 PM  Visual Inspection No deformities, no ulcerations, no other skin breakdown bilaterally: Yes Sensation Testing Intact to touch and monofilament testing bilaterally: Yes Pulse Check Posterior Tibialis and Dorsalis pulse intact bilaterally: Yes Comments     LABS Reviewed Lab Results  Component Value Date   HGBA1C 5.4 07/02/2024   HGBA1C 5.4 01/02/2024    HGBA1C 5.5 07/03/2023   No results found for: FRUCTOSAMINE Lab Results  Component Value Date   CHOL 192 01/02/2023   HDL 81.20 01/02/2023   LDLCALC 94 01/02/2023   TRIG 81.0 01/02/2023   CHOLHDL 2 01/02/2023   Lab Results  Component Value Date   MICRALBCREAT 14 07/02/2024   Lab Results  Component Value Date   CREATININE 0.77 07/02/2024   Lab Results  Component Value Date   GFR 73.63 07/03/2023    ASSESSMENT / PLAN  1. Type 2 diabetes mellitus without complication, without long-term current use of insulin  (HCC)     Diabetes Mellitus type 2, complicated by no known complications.  Type 2 diabetes is in remission. - Diabetic status / severity: controlled on lifestyle modification.  Lab Results  Component Value Date   HGBA1C 5.4 07/02/2024    - Hemoglobin A1c goal : <6.5%  Patient was diagnosed with type 2 diabetes mellitus with hemoglobin A1c of 11.3% in May 2022, at that time she was hospitalized with HHS, type 2 diabetes mellitus was thought to be related to having as acute pancreatitis at that time.  Patient has rapid improvement on blood sugar with hemoglobin A1c has remained mostly in the normal range 5.1 to 5.5% since December 2022.  She has no recurrent pancreatitis or chronic pancreatitis.  Patient has significant improvement on diet and exercise, lost significant amount of weight intentionally.  Basal insulin  therapy was stopped in July 2023 and metformin  was stopped in August 2024.  She has remained controlled diabetes with almost all blood sugar in the normal range, without any diabetic medication.  She has been regularly following with nutritionist and has maintained lifestyle modification with diet and exercise.  - Medications: No change.  I) no plan for antidiabetic medication.   II) continue lifestyle modification with diet and exercise.  Okay to follow-up with primary care provider.  She wants to follow-up in this clinic, will follow-up in 1  year.  - Home glucose testing: She preferred to continue on CGM, okay to continue freestyle libre 2 + monitor and check blood sugar as needed. - Discussed/ Gave Hypoglycemia treatment plan.  # Consult : not required at this time.   # Annual urine for microalbuminuria/ creatinine ratio, no microalbuminuria currently. Last  Lab Results  Component Value Date   MICRALBCREAT 14 07/02/2024    # Foot check nightly.  # Annual dilated diabetic eye exams.    2. Blood pressure  -  BP Readings from Last 1 Encounters:  09/02/24 112/62    - Control is in target.  - No change in current plans.  3. Lipid status / Hyperlipidemia - Last  Lab Results  Component Value Date   LDLCALC 94 01/02/2023   -Currently not on a statin.  Patient prefers not to be on a statin.  Managed by primary care provider.  Diagnoses and all orders for this visit:  Type 2 diabetes mellitus without complication, without long-term current use of insulin  (HCC) -     Basic metabolic panel with GFR -     Hemoglobin A1c -     Microalbumin / creatinine urine ratio     DISPOSITION Follow up in clinic in 12 months suggested.  Labs prior to follow-up visit.   All questions answered and patient verbalized understanding of the plan.  Emma Sitlali Koerner, MD [facility] Endocrinology [facility] [address] [place], [address] [place], [place] [address] Phone # [phone]  At least part of this note was generated using voice recognition software. Inadvertent word errors may have occurred, which were not recognized during the proofreading process.

## 2024-10-20 ENCOUNTER — Other Ambulatory Visit

## 2024-10-26 ENCOUNTER — Ambulatory Visit
Admission: RE | Admit: 2024-10-26 | Discharge: 2024-10-26 | Disposition: A | Source: Ambulatory Visit | Attending: Obstetrics and Gynecology

## 2024-10-26 DIAGNOSIS — Z01419 Encounter for gynecological examination (general) (routine) without abnormal findings: Secondary | ICD-10-CM

## 2024-10-26 DIAGNOSIS — Z1231 Encounter for screening mammogram for malignant neoplasm of breast: Secondary | ICD-10-CM

## 2024-10-29 ENCOUNTER — Ambulatory Visit: Payer: Self-pay | Admitting: Obstetrics and Gynecology

## 2025-01-12 ENCOUNTER — Other Ambulatory Visit

## 2025-09-01 ENCOUNTER — Other Ambulatory Visit

## 2025-09-05 ENCOUNTER — Ambulatory Visit: Admitting: Endocrinology
